# Patient Record
Sex: Female | Born: 1985 | Race: Black or African American | Hispanic: No | Marital: Married | Smoking: Current some day smoker
Health system: Southern US, Community
[De-identification: ages and names within clinical notes are randomized; demographics above are authoritative.]

## PROBLEM LIST (undated history)

## (undated) ENCOUNTER — Inpatient Hospital Stay (HOSPITAL_COMMUNITY): Payer: Medicaid Other

## (undated) DIAGNOSIS — F419 Anxiety disorder, unspecified: Secondary | ICD-10-CM

## (undated) DIAGNOSIS — F32A Depression, unspecified: Secondary | ICD-10-CM

## (undated) DIAGNOSIS — I10 Essential (primary) hypertension: Secondary | ICD-10-CM

## (undated) DIAGNOSIS — T8859XA Other complications of anesthesia, initial encounter: Secondary | ICD-10-CM

## (undated) DIAGNOSIS — R002 Palpitations: Secondary | ICD-10-CM

## (undated) DIAGNOSIS — E119 Type 2 diabetes mellitus without complications: Secondary | ICD-10-CM

## (undated) HISTORY — DX: Depression, unspecified: F32.A

## (undated) HISTORY — DX: Palpitations: R00.2

## (undated) HISTORY — DX: Type 2 diabetes mellitus without complications: E11.9

## (undated) HISTORY — PX: TUBAL LIGATION: SHX77

## (undated) HISTORY — DX: Anxiety disorder, unspecified: F41.9

---

## 2010-10-04 ENCOUNTER — Emergency Department (HOSPITAL_COMMUNITY)
Admission: EM | Admit: 2010-10-04 | Discharge: 2010-10-04 | Disposition: A | Discharge status: Home / Self Care | Payer: PRIVATE HEALTH INSURANCE | Attending: Emergency Medicine | Admitting: Emergency Medicine

## 2010-10-04 DIAGNOSIS — R109 Unspecified abdominal pain: Secondary | ICD-10-CM | POA: Insufficient documentation

## 2010-10-04 DIAGNOSIS — L02219 Cutaneous abscess of trunk, unspecified: Secondary | ICD-10-CM | POA: Insufficient documentation

## 2010-10-04 DIAGNOSIS — L03319 Cellulitis of trunk, unspecified: Secondary | ICD-10-CM | POA: Insufficient documentation

## 2010-11-17 ENCOUNTER — Emergency Department: Payer: Self-pay | Admitting: Emergency Medicine

## 2011-08-06 ENCOUNTER — Emergency Department (HOSPITAL_COMMUNITY)
Admission: EM | Admit: 2011-08-06 | Discharge: 2011-08-07 | Disposition: A | Discharge status: Home Health | Payer: Self-pay | Attending: Emergency Medicine | Admitting: Emergency Medicine

## 2011-08-06 ENCOUNTER — Encounter (HOSPITAL_COMMUNITY): Payer: Self-pay | Admitting: *Deleted

## 2011-08-06 DIAGNOSIS — IMO0001 Reserved for inherently not codable concepts without codable children: Secondary | ICD-10-CM | POA: Insufficient documentation

## 2011-08-06 DIAGNOSIS — L02219 Cutaneous abscess of trunk, unspecified: Secondary | ICD-10-CM | POA: Insufficient documentation

## 2011-08-06 DIAGNOSIS — R609 Edema, unspecified: Secondary | ICD-10-CM | POA: Insufficient documentation

## 2011-08-06 DIAGNOSIS — L0291 Cutaneous abscess, unspecified: Secondary | ICD-10-CM

## 2011-08-06 NOTE — ED Notes (Signed)
The pt has a boil in her groin for approx 2 days.. Redness only initially now more acute pain

## 2011-08-07 MED ORDER — SULFAMETHOXAZOLE-TRIMETHOPRIM 800-160 MG PO TABS: 1.0000 | ORAL_TABLET | Freq: Two times a day (BID) | ORAL | Status: AC

## 2011-08-07 NOTE — ED Notes (Signed)
Patient discharged a few hours ago. Has returned requesting work note. Printed and given to patient.

## 2011-08-07 NOTE — Discharge Instructions (Signed)

## 2011-08-07 NOTE — ED Provider Notes (Signed)
History     CSN: [id number]  Arrival date & time 08/06/11  2231   First MD Initiated Contact with Patient 08/07/11 [phone]      Chief Complaint  Patient presents with  . Recurrent Skin Infections    (Consider location/radiation/quality/duration/timing/severity/associated sxs/prior treatment) The history is provided by the patient.   abscess started about 2 days ago. Patient has history of same requiring I&D. This one started off as a pimple. She believes it is due to shaving her pubic region. It is in the hairline and does not involve the labia. No vaginal discharge. A vaginal bleeding. Abscess has become larger and more painful and swollen. No fevers or chills. No nausea vomiting. Moderate in severity. Pain is sharp and not radiating. Constant and progressive symptoms. No difficulty urinating.  History reviewed. No pertinent past medical history.  History reviewed. No pertinent past surgical history.  History reviewed. No pertinent family history.  History  Substance Use Topics  . Smoking status: Never Smoker   . Smokeless tobacco: Not on file  . Alcohol Use: Yes    OB History    Grav Para Term Preterm Abortions TAB SAB Ect Mult Living                  Review of Systems  Constitutional: Negative for fever and chills.  HENT: Negative for neck pain and neck stiffness.   Eyes: Negative for pain.  Respiratory: Negative for shortness of breath.   Cardiovascular: Negative for chest pain.  Gastrointestinal: Negative for abdominal pain.  Genitourinary: Negative for dysuria and hematuria.  Musculoskeletal: Negative for back pain.  Skin: Negative for rash.  Neurological: Negative for headaches.  All other systems reviewed and are negative.    Allergies  Review of patient's allergies indicates no known allergies.  Home Medications   Current Outpatient Rx  Name Route Sig Dispense Refill  . NAPROXEN SODIUM 220 MG PO TABS Oral Take 440 mg by mouth every 30 (thirty) days.        BP 127/83  Pulse 89  Temp(Src) 97.9 F (36.6 C) (Oral)  Resp 14  SpO2 98%  LMP 07/06/2011  Physical Exam  Constitutional: She is oriented to person, place, and time. She appears well-developed and well-nourished.  HENT:  Head: Normocephalic and atraumatic.  Eyes: Conjunctivae and EOM are normal. Pupils are equal, round, and reactive to light.  Neck: Trachea normal. Neck supple. No thyromegaly present.  Cardiovascular: Normal rate, regular rhythm, S1 normal, S2 normal and normal pulses.     No systolic murmur is present   No diastolic murmur is present  Pulses:      Radial pulses are 2+ on the right side, and 2+ on the left side.  Pulmonary/Chest: Effort normal and breath sounds normal. She has no wheezes. She has no rhonchi. She has no rales. She exhibits no tenderness.  Abdominal: Soft. Normal appearance and bowel sounds are normal. There is no tenderness. There is no CVA tenderness and negative Murphy's sign.  Genitourinary:       Pubic area with palpable tender area of fluctuance consistent with abscess. No active drainage. There is pointing. No labial or vaginal abscess  Musculoskeletal:       BLE:s Calves nontender, no cords or erythema, negative Homans sign  Neurological: She is alert and oriented to person, place, and time. She has normal strength. No cranial nerve deficit or sensory deficit. GCS eye subscore is 4. GCS verbal subscore is 5. GCS motor subscore is 6.  Skin: Skin is warm and dry. No rash noted. She is not diaphoretic.  Psychiatric: Her speech is normal.       Cooperative and appropriate    ED Course  INCISION AND DRAINAGE Date/Time: 08/07/2011 2:52 AM Performed by: Sunnie Nielsen Authorized by: Sunnie Nielsen Consent: Verbal consent obtained. Risks and benefits: risks, benefits and alternatives were discussed Consent given by: patient Patient understanding: patient states understanding of the procedure being performed Patient consent: the patient's  understanding of the procedure matches consent given Procedure consent: procedure consent matches procedure scheduled Patient identity confirmed: verbally with patient Time out: Immediately prior to procedure a "time out" was called to verify the correct patient, procedure, equipment, support staff and site/side marked as required. Type: abscess Location: Midline pelvic region within pubic hairline. Anesthesia: local infiltration Local anesthetic: lidocaine 1% with epinephrine Anesthetic total: 1 ml Risk factor: underlying major vessel, underlying major nerve and coagulopathy Scalpel size: 11 Needle gauge: 22 Incision type: single straight Complexity: complex Drainage: purulent Drainage amount: moderate Wound treatment: wound left open Packing material: 1/4 in iodoform gauze Patient tolerance: Patient tolerated the procedure well with no immediate complications.   (including critical care time)     MDM   Abscess with I&D as above. Plan recheck 48 hours. Antibiotics provided. Reliable historian and agrees to all discharge and followup instructions.        Sunnie Nielsen, MD 08/07/11 [phone]

## 2011-08-10 LAB — WOUND CULTURE

## 2011-08-11 NOTE — ED Notes (Signed)
+   Urine; treated per protocol; sensitive to same

## 2012-02-20 ENCOUNTER — Emergency Department (HOSPITAL_COMMUNITY)
Admission: EM | Admit: 2012-02-20 | Discharge: 2012-02-21 | Disposition: A | Discharge status: Home / Self Care | Payer: 59 | Attending: Emergency Medicine | Admitting: Emergency Medicine

## 2012-02-20 ENCOUNTER — Encounter (HOSPITAL_COMMUNITY): Payer: Self-pay | Admitting: Emergency Medicine

## 2012-02-20 DIAGNOSIS — L02219 Cutaneous abscess of trunk, unspecified: Secondary | ICD-10-CM | POA: Insufficient documentation

## 2012-02-20 DIAGNOSIS — L03319 Cellulitis of trunk, unspecified: Secondary | ICD-10-CM | POA: Insufficient documentation

## 2012-02-20 DIAGNOSIS — L0291 Cutaneous abscess, unspecified: Secondary | ICD-10-CM

## 2012-02-20 DIAGNOSIS — F17200 Nicotine dependence, unspecified, uncomplicated: Secondary | ICD-10-CM | POA: Insufficient documentation

## 2012-02-20 NOTE — ED Notes (Signed)
Pt has large (ping pong ball) sized abscess left groin area. This is recurrent from I&D several months ago (february) at [facility]. Noted enlarged area 2 days ago, and now has become more painful. Did take Advil PT which has helped with pain.

## 2012-02-21 MED ORDER — LIDOCAINE-EPINEPHRINE 2 %-1:100000 IJ SOLN
20.0000 mL | Freq: Once | INTRAMUSCULAR | Status: AC
  Administered 2012-02-21: 20 mL

## 2012-02-21 MED ORDER — HYDROCODONE-ACETAMINOPHEN 5-325 MG PO TABS: ORAL_TABLET | ORAL | Status: AC

## 2012-02-21 NOTE — ED Provider Notes (Signed)
History     CSN: [id number]  Arrival date & time 02/20/12  2318   First MD Initiated Contact with Patient 02/21/12 0023      Chief Complaint  Patient presents with  . Abscess    (Consider location/radiation/quality/duration/timing/severity/associated sxs/prior treatment) HPI Comments: Patient presents with complaint of left groin abscess. Patient noted the area began to swell more yesterday. She has had a abscess in this area before and has been drinking in the past. Patient has been taking Motrin which has improved her pain. She denies any drainage, fever, vomiting. No other treatments prior to arrival. Nothing makes symptoms worse.  Patient is a 26 y.o. female presenting with abscess. The history is provided by the patient.  Abscess  This is a recurrent problem. The current episode started yesterday. The onset was gradual. The problem has been gradually worsening. The abscess is present on the groin. The abscess is characterized by swelling. Pertinent negatives include no fever and no vomiting.    History reviewed. No pertinent past medical history.  History reviewed. No pertinent past surgical history.  No family history on file.  History  Substance Use Topics  . Smoking status: Current Some Day Smoker    Types: Cigars  . Smokeless tobacco: Never Used  . Alcohol Use: Yes     vodka couple times month    OB History    Grav Para Term Preterm Abortions TAB SAB Ect Mult Living                  Review of Systems  Constitutional: Negative for fever.  Gastrointestinal: Negative for nausea and vomiting.  Skin: Negative for color change.       Positive for abscess.  Hematological: Negative for adenopathy.    Allergies  Review of patient's allergies indicates no known allergies.  Home Medications   Current Outpatient Rx  Name Route Sig Dispense Refill  . NAPROXEN SODIUM 220 MG PO TABS Oral Take 440 mg by mouth every 30 (thirty) days.      BP 117/82  Pulse 88   Temp 98.4 F (36.9 C) (Oral)  SpO2 100%  LMP 02/02/2012  Physical Exam  Nursing note and vitals reviewed. Constitutional: She appears well-developed and well-nourished.  HENT:  Head: Normocephalic and atraumatic.  Eyes: Conjunctivae normal are normal.  Neck: Normal range of motion. Neck supple.  Pulmonary/Chest: No respiratory distress.  Neurological: She is alert.  Skin: Skin is warm and dry.       2 cm x 3 cm area of induration with overlying erythema of left groin consistent with abscess. There is no surrounding erythema to suggest cellulitis infection. There is no drainage from the area.  Psychiatric: She has a normal mood and affect.    ED Course  Procedures (including critical care time)  Labs Reviewed - No data to display No results found.   1. Abscess     12:25 AM Patient seen and examined. Work-up initiated.   Vital signs reviewed and are as follows: Filed Vitals:   02/20/12 2350  BP: 117/82  Pulse: 88  Temp: 98.4 F (36.9 C)   INCISION AND DRAINAGE Performed by: Carolee Rota Consent: Verbal consent obtained. Risks and benefits: risks, benefits and alternatives were discussed Type: abscess  Body area: left groin  Anesthesia: local infiltration  Local anesthetic: lidocaine 2% with epinephrine  Anesthetic total: 2 ml  Complexity: complex Blunt dissection to break up loculations  Drainage: purulent  Drainage amount: moderate  Packing material: 1/4  in iodoform gauze  Patient tolerance: Patient tolerated the procedure well with no immediate complications.  The patient was urged to return to the Emergency Department urgently with worsening pain, swelling, expanding erythema especially if it streaks away from the affected area, fever, or if they have any other concerns.   The patient was instructed to remove packing at home in 48 hours and return to the Emergency Department or go to their PCP at that time for a recheck if their symptoms are not  entirely resolved.  The patient verbalized understanding and stated agreement with this plan.   Patient counseled on use of narcotic pain medications. Counseled not to combine these medications with others containing tylenol. Urged not to drink alcohol, drive, or perform any other activities that requires focus while taking these medications. The patient verbalizes understanding and agrees with the plan.   MDM  Patient with skin abscess amenable to incision and drainage.  Abscess was large enough to warrant packing with removal in 2 days. No signs of cellulitis is surrounding skin.  Will d/c to home.  No antibiotic therapy is indicated.         [place], [place] 02/21/12 [phone]

## 2012-02-21 NOTE — ED Notes (Signed)
Discharge instructions reviewed w/ pt., verbalizes understanding. One prescription provided at discharge. 

## 2012-02-21 NOTE — ED Provider Notes (Signed)
Medical screening examination/treatment/procedure(s) were performed by non-physician practitioner and as supervising physician I was immediately available for consultation/collaboration.   Hanley Seamen, MD 02/21/12 [phone]

## 2012-04-25 ENCOUNTER — Emergency Department (HOSPITAL_COMMUNITY)
Admission: EM | Admit: 2012-04-25 | Discharge: 2012-04-25 | Disposition: A | Discharge status: Home / Self Care | Payer: Self-pay | Attending: Emergency Medicine | Admitting: Emergency Medicine

## 2012-04-25 ENCOUNTER — Encounter (HOSPITAL_COMMUNITY): Payer: Self-pay | Admitting: Emergency Medicine

## 2012-04-25 DIAGNOSIS — R112 Nausea with vomiting, unspecified: Secondary | ICD-10-CM | POA: Insufficient documentation

## 2012-04-25 DIAGNOSIS — Z79899 Other long term (current) drug therapy: Secondary | ICD-10-CM | POA: Insufficient documentation

## 2012-04-25 DIAGNOSIS — F17200 Nicotine dependence, unspecified, uncomplicated: Secondary | ICD-10-CM | POA: Insufficient documentation

## 2012-04-25 LAB — COMPREHENSIVE METABOLIC PANEL
Albumin: 3.5 g/dL (ref 3.5–5.2)
BUN: 9 mg/dL (ref 6–23)
Creatinine, Ser: 0.6 mg/dL (ref 0.50–1.10)
Total Protein: 7.2 g/dL (ref 6.0–8.3)

## 2012-04-25 LAB — CBC WITH DIFFERENTIAL/PLATELET
Basophils Relative: 0 % (ref 0–1)
Eosinophils Absolute: 0 10*3/uL (ref 0.0–0.7)
HCT: 37.5 % (ref 36.0–46.0)
Hemoglobin: 13.1 g/dL (ref 12.0–15.0)
MCH: 28.2 pg (ref 26.0–34.0)
MCHC: 34.9 g/dL (ref 30.0–36.0)
Monocytes Absolute: 0.5 10*3/uL (ref 0.1–1.0)
Monocytes Relative: 9 % (ref 3–12)
Neutrophils Relative %: 81 % — ABNORMAL HIGH (ref 43–77)
RDW: 12.8 % (ref 11.5–15.5)

## 2012-04-25 LAB — URINALYSIS, ROUTINE W REFLEX MICROSCOPIC
Glucose, UA: NEGATIVE mg/dL
Leukocytes, UA: NEGATIVE
Protein, ur: NEGATIVE mg/dL
Urobilinogen, UA: 1 mg/dL (ref 0.0–1.0)

## 2012-04-25 MED ORDER — ONDANSETRON HCL 4 MG/2ML IJ SOLN
4.0000 mg | Freq: Once | INTRAMUSCULAR | Status: AC
  Administered 2012-04-25: 4 mg via INTRAVENOUS
  Filled 2012-04-25: qty 2

## 2012-04-25 MED ORDER — ONDANSETRON HCL 4 MG PO TABS: 4.0000 mg | ORAL_TABLET | Freq: Three times a day (TID) | ORAL | Status: DC | PRN

## 2012-04-25 MED ORDER — SODIUM CHLORIDE 0.9 % IV BOLUS (SEPSIS)
1000.0000 mL | Freq: Once | INTRAVENOUS | Status: AC
  Administered 2012-04-25: 1000 mL via INTRAVENOUS

## 2012-04-25 NOTE — ED Notes (Signed)
PA at bedside.

## 2012-04-25 NOTE — Progress Notes (Signed)
Late entry for 1300 04/25/12 no pcp listed for pt CM discussed with pt how to obtain an in network provider through [facility] for follow up care

## 2012-04-25 NOTE — ED Notes (Signed)
Pt escorted to discharge window. Pt verbalized understanding discharge instructions. In no acute distress.  

## 2012-04-25 NOTE — ED Notes (Signed)
Pt c/o abdominal pain that started yesterday and nausea. Did vomit this morning and became drenched in sweat however not actively vomiting at the moment. Pt states that she does become lightheaded at times.

## 2012-04-25 NOTE — ED Notes (Signed)
Per PA order pt has drank fluids and not vomitted

## 2012-04-25 NOTE — ED Provider Notes (Signed)
Medical screening examination/treatment/procedure(s) were performed by non-physician practitioner and as supervising physician I was immediately available for consultation/collaboration.  Flint Melter, MD 04/25/12 2126

## 2012-04-25 NOTE — ED Provider Notes (Signed)
History     CSN: [id number]  Arrival date & time 04/25/12  1052   First MD Initiated Contact with Patient 04/25/12 1156      Chief Complaint  Patient presents with  . Abdominal Pain  . Nausea    (Consider location/radiation/quality/duration/timing/severity/associated sxs/prior treatment) HPI  Joyce Green is a 26 y.o. female complaining of nausea and abdominal pain that both started and resolved yesterday. At its worse pain was 6/10, non-localizable. Patient had single episode of nonbloody nonbilious non-coffee-ground emesis this morning and reports a subjective fever. She also states she has a lightheaded sensation yesterday but is now resolved. Last menstrual period ended to days ago.  History reviewed. No pertinent past medical history.  History reviewed. No pertinent past surgical history.  History reviewed. No pertinent family history.  History  Substance Use Topics  . Smoking status: Light Tobacco Smoker    Types: Cigars  . Smokeless tobacco: Never Used  . Alcohol Use: Yes     Comment: vodka couple times month    OB History    Grav Para Term Preterm Abortions TAB SAB Ect Mult Living                  Review of Systems  Constitutional: Negative for fever.  Respiratory: Negative for shortness of breath.   Cardiovascular: Negative for chest pain.  Gastrointestinal: Positive for nausea. Negative for vomiting, abdominal pain and diarrhea.  All other systems reviewed and are negative.    Allergies  Review of patient's allergies indicates no known allergies.  Home Medications   Current Outpatient Rx  Name  Route  Sig  Dispense  Refill  . BIOTIN PO   Oral   Take 1 tablet by mouth daily.         Marland Kitchen VICKS NYQUIL COLD & FLU NIGHT PO   Oral   Take 1 capsule by mouth every evening.         Marland Kitchen NAPROXEN SODIUM 220 MG PO TABS   Oral   Take 440 mg by mouth every 30 (thirty) days.         . DAYQUIL PO   Oral   Take 1 capsule by mouth every  morning.           BP 135/92  Pulse 90  Temp 97.9 F (36.6 C) (Oral)  Resp 18  Ht 5\' 10"  (1.778 m)  Wt 205 lb (92.987 kg)  BMI 29.41 kg/m2  SpO2 100%  LMP 04/21/2012  Physical Exam  Nursing note and vitals reviewed. Constitutional: She is oriented to person, place, and time. She appears well-developed and well-nourished. No distress.  HENT:  Head: Normocephalic.  Mouth/Throat: Oropharynx is clear and moist.  Eyes: Conjunctivae normal and EOM are normal. Pupils are equal, round, and reactive to light.  Neck: Normal range of motion.  Cardiovascular: Normal rate.   Pulmonary/Chest: Effort normal and breath sounds normal. No stridor. No respiratory distress. She has no wheezes. She has no rales. She exhibits no tenderness.  Abdominal: Soft. Bowel sounds are normal. She exhibits no distension and no mass. There is no rebound and no guarding.  Musculoskeletal: Normal range of motion.  Neurological: She is alert and oriented to person, place, and time.  Skin: Skin is warm.  Psychiatric: She has a normal mood and affect.    ED Course  Procedures (including critical care time)  Labs Reviewed  URINALYSIS, ROUTINE W REFLEX MICROSCOPIC - Abnormal; Notable for the following:    APPearance CLOUDY (*)  Specific Gravity, Urine 1.033 (*)     Bilirubin Urine SMALL (*)     Ketones, ur TRACE (*)     All other components within normal limits  CBC WITH DIFFERENTIAL - Abnormal; Notable for the following:    Neutrophils Relative 81 (*)     Lymphocytes Relative 10 (*)     Lymphs Abs 0.6 (*)     All other components within normal limits  COMPREHENSIVE METABOLIC PANEL - Abnormal; Notable for the following:    Glucose, Bld 219 (*)     Calcium 8.3 (*)     All other components within normal limits  PREGNANCY, URINE   No results found.   1. Nausea & vomiting       MDM  Benign abdominal Exam  Urinalysis and blood work are unremarkable except for a mildly elevated blood glucose of  200.  Patient is tolerating by mouth at this time. Serial abdominal exams remained benign and patient remains pain-free. Vital signs are stable results discussed the patient's she is amenable to discharge with return precautions given and I advised her to follow in one to 2 weeks for a recheck of her blood sugar. Patient verbalized understanding  New Prescriptions   ONDANSETRON (ZOFRAN) 4 MG TABLET    Take 1 tablet (4 mg total) by mouth every 8 (eight) hours as needed for nausea.          Wynetta Emery, PA-C 04/25/12 1332

## 2012-04-25 NOTE — ED Notes (Signed)
Pt alert and oriented x4. Respirations even and unlabored, bilateral symmetrical rise and fall of chest. Skin warm and dry. In no acute distress. Denies needs.   

## 2012-08-13 LAB — HM DIABETES EYE EXAM: HM Diabetic Eye Exam: NORMAL

## 2012-11-04 ENCOUNTER — Emergency Department (HOSPITAL_COMMUNITY): Payer: 59

## 2012-11-04 ENCOUNTER — Emergency Department (HOSPITAL_COMMUNITY)
Admission: EM | Admit: 2012-11-04 | Discharge: 2012-11-05 | Disposition: A | Discharge status: Home / Self Care | Payer: 59 | Attending: Emergency Medicine | Admitting: Emergency Medicine

## 2012-11-04 ENCOUNTER — Encounter (HOSPITAL_COMMUNITY): Payer: Self-pay | Admitting: *Deleted

## 2012-11-04 DIAGNOSIS — F17200 Nicotine dependence, unspecified, uncomplicated: Secondary | ICD-10-CM | POA: Insufficient documentation

## 2012-11-04 DIAGNOSIS — R0789 Other chest pain: Secondary | ICD-10-CM | POA: Insufficient documentation

## 2012-11-04 DIAGNOSIS — Z79899 Other long term (current) drug therapy: Secondary | ICD-10-CM | POA: Insufficient documentation

## 2012-11-04 DIAGNOSIS — R002 Palpitations: Secondary | ICD-10-CM | POA: Insufficient documentation

## 2012-11-04 DIAGNOSIS — Z3202 Encounter for pregnancy test, result negative: Secondary | ICD-10-CM | POA: Insufficient documentation

## 2012-11-04 LAB — URINALYSIS, ROUTINE W REFLEX MICROSCOPIC
Ketones, ur: NEGATIVE mg/dL
Leukocytes, UA: NEGATIVE
Nitrite: NEGATIVE
Specific Gravity, Urine: 1.021 (ref 1.005–1.030)
pH: 6 (ref 5.0–8.0)

## 2012-11-04 LAB — POCT I-STAT TROPONIN I: Troponin i, poc: 0 ng/mL (ref 0.00–0.08)

## 2012-11-04 LAB — POCT I-STAT, CHEM 8
Chloride: 107 mEq/L (ref 96–112)
HCT: 41 % (ref 36.0–46.0)
Potassium: 3.8 mEq/L (ref 3.5–5.1)
Sodium: 140 mEq/L (ref 135–145)

## 2012-11-04 NOTE — ED Provider Notes (Signed)
History     CSN: [id number]  Arrival date & time 11/04/12  2021   First MD Initiated Contact with Patient 11/04/12 2136      Chief Complaint  Patient presents with  . Chest Pain    (Consider location/radiation/quality/duration/timing/severity/associated sxs/prior treatment) HPI Pt with recurrent central chest pain, palpitations and anxiety for several years. Has never seen cardiologist. States she started having the palpitations 4 days ago associated with chest pain. No cough, fever, chills, SOB, lower ext swelling or pain, recent travel/surgery, family hx of PE/DVT or CAD. No nausea, vomiting, diarrhea. No recent weight loss. Pt states she has not been under increased stress lately and has no history of panic disorder or depression.  Past Medical History  Diagnosis Date  . Palpitations   . Nausea   . AP (abdominal pain)     History reviewed. No pertinent past surgical history.  No family history on file.  History  Substance Use Topics  . Smoking status: Light Tobacco Smoker    Types: Cigars  . Smokeless tobacco: Never Used  . Alcohol Use: Yes     Comment: vodka couple times month    OB History   Grav Para Term Preterm Abortions TAB SAB Ect Mult Living                  Review of Systems  Constitutional: Negative for fever, chills, diaphoresis, activity change, appetite change, fatigue and unexpected weight change.  HENT: Negative for neck pain and neck stiffness.   Respiratory: Negative for cough, shortness of breath and wheezing.   Cardiovascular: Positive for chest pain and palpitations. Negative for leg swelling.  Gastrointestinal: Negative for nausea, vomiting, abdominal pain and diarrhea.  Genitourinary: Negative for dysuria.  Musculoskeletal: Negative for myalgias and back pain.  Skin: Negative for rash and wound.  Neurological: Negative for dizziness, syncope, weakness, numbness and headaches.  All other systems reviewed and are negative.    Allergies   Review of patient's allergies indicates no known allergies.  Home Medications   Current Outpatient Rx  Name  Route  Sig  Dispense  Refill  . aspirin 325 MG tablet   Oral   Take 325 mg by mouth every 4 (four) hours as needed for pain.         Marland Kitchen BIOTIN PO   Oral   Take 1 tablet by mouth daily.           BP 149/105  Pulse 74  Temp(Src) 98.3 F (36.8 C)  Resp 20  SpO2 100%  LMP 10/05/2012  Physical Exam  Nursing note and vitals reviewed. Constitutional: She is oriented to person, place, and time. She appears well-developed and well-nourished. No distress.  HENT:  Head: Normocephalic and atraumatic.  Mouth/Throat: Oropharynx is clear and moist.  Eyes: EOM are normal. Pupils are equal, round, and reactive to light.  Mild protosis   Neck: Normal range of motion. Neck supple. No thyromegaly present.  Cardiovascular: Normal rate and regular rhythm.  Exam reveals no gallop and no friction rub.   No murmur heard. Pulmonary/Chest: Effort normal and breath sounds normal. No respiratory distress. She has no wheezes. She has no rales.  Mildly prolonged expiratory phase  Abdominal: Soft. Bowel sounds are normal. She exhibits no distension and no mass. There is no tenderness. There is no rebound and no guarding.  Musculoskeletal: Normal range of motion. She exhibits no edema and no tenderness.  No calf swelling or pain  Neurological: She is alert and  oriented to person, place, and time.  5/5 motor in all ext, sensation intact  Skin: Skin is warm and dry. No rash noted. No erythema.  Psychiatric: She has a normal mood and affect. Her behavior is normal.  Does not appear anxious. Good eye contact    ED Course  Procedures (including critical care time)  Labs Reviewed  URINALYSIS, ROUTINE W REFLEX MICROSCOPIC - Abnormal; Notable for the following:    APPearance CLOUDY (*)    All other components within normal limits  POCT I-STAT, CHEM 8 - Abnormal; Notable for the following:     Glucose, Bld 174 (*)    All other components within normal limits  PREGNANCY, URINE  TSH  POCT I-STAT TROPONIN I   Dg Chest 2 View  11/04/2012   *RADIOLOGY REPORT*  Clinical Data: Heart palpitations  CHEST - 2 VIEW  Comparison: None.  Findings: Normal mediastinum and cardiac silhouette.  Normal pulmonary  vasculature.  No evidence of effusion, infiltrate, or pneumothorax.  No acute bony abnormality.  IMPRESSION: No acute cardiopulmonary process.   Original Report Authenticated By: Genevive Bi, M.D.     1. Palpitations   2. Atypical chest pain      Date: 11/04/2012  Rate: 88  Rhythm: normal sinus rhythm  QRS Axis: normal  Intervals: normal  ST/T Wave abnormalities: nonspecific T wave changes  Conduction Disutrbances:none  Narrative Interpretation:   Old EKG Reviewed: none available    MDM   Pt resting well in ED and is asymptomatic. i have advised cardiology f/u and given strict return precautions. CAD is very unlikely though episodic arrythmia is possible.        Loren Racer, MD 11/06/12 [phone]

## 2012-11-04 NOTE — ED Notes (Signed)
Patient is alert and oriented x3.  She is complaining of sharp intermittent chest pain that started 4 days ago and progressively worsened.  Currently she rates her pain 1 of 10.  She states that she has had this issue before.

## 2012-11-04 NOTE — ED Notes (Signed)
Denies any shortness of breath

## 2012-11-04 NOTE — ED Notes (Signed)
Pt states having spasms that feel like chest pain and feels like heart racing; treated for same two years ago at [facility]; recently x 3 days has had reoccuring symptoms; pain can be sharp at times other times just heart racing

## 2012-11-05 LAB — TSH: TSH: 1.98 u[IU]/mL (ref 0.350–4.500)

## 2012-11-05 NOTE — ED Notes (Signed)
Patient is alert and oriented x3.  She was given DC instructions and follow up visit instructions.  Patient gave verbal understanding. She was DC ambulatory under her own power to home.  V/S stable.  He was not showing any signs of distress on DC 

## 2012-11-06 ENCOUNTER — Encounter: Payer: Self-pay | Admitting: Cardiology

## 2012-11-06 ENCOUNTER — Encounter: Payer: Self-pay | Admitting: *Deleted

## 2012-11-06 DIAGNOSIS — R110 Nausea: Secondary | ICD-10-CM | POA: Insufficient documentation

## 2012-11-06 DIAGNOSIS — R109 Unspecified abdominal pain: Secondary | ICD-10-CM | POA: Insufficient documentation

## 2012-11-06 DIAGNOSIS — R002 Palpitations: Secondary | ICD-10-CM | POA: Insufficient documentation

## 2012-11-07 ENCOUNTER — Encounter: Payer: Self-pay | Admitting: Cardiology

## 2012-11-07 ENCOUNTER — Ambulatory Visit (INDEPENDENT_AMBULATORY_CARE_PROVIDER_SITE_OTHER): Payer: 59

## 2012-11-07 ENCOUNTER — Ambulatory Visit (INDEPENDENT_AMBULATORY_CARE_PROVIDER_SITE_OTHER): Payer: 59 | Admitting: Cardiology

## 2012-11-07 VITALS — BP 130/84 | HR 88 | Ht 70.0 in | Wt 217.0 lb

## 2012-11-07 DIAGNOSIS — R002 Palpitations: Secondary | ICD-10-CM

## 2012-11-07 DIAGNOSIS — O24319 Unspecified pre-existing diabetes mellitus in pregnancy, unspecified trimester: Secondary | ICD-10-CM | POA: Insufficient documentation

## 2012-11-07 DIAGNOSIS — R079 Chest pain, unspecified: Secondary | ICD-10-CM

## 2012-11-07 DIAGNOSIS — R7309 Other abnormal glucose: Secondary | ICD-10-CM

## 2012-11-07 DIAGNOSIS — R0989 Other specified symptoms and signs involving the circulatory and respiratory systems: Secondary | ICD-10-CM

## 2012-11-07 NOTE — Assessment & Plan Note (Signed)
Electrocardiogram normal. Check echocardiogram. Symptoms not consistent with ischemia.

## 2012-11-07 NOTE — Progress Notes (Signed)
Placed a 30 day event monitor on patient 

## 2012-11-07 NOTE — Patient Instructions (Addendum)
Your physician recommends that you schedule a follow-up appointment in: 8-10 WEEKS   Your physician has requested that you have an echocardiogram. Echocardiography is a painless test that uses sound waves to create images of your heart. It provides your doctor with information about the size and shape of your heart and how well your heart's chambers and valves are working. This procedure takes approximately one hour. There are no restrictions for this procedure.   Your physician has recommended that you wear an event monitor. Event monitors are medical devices that record the heart's electrical activity. Doctors most often [place] these monitors to diagnose arrhythmias. Arrhythmias are problems with the speed or rhythm of the heartbeat. The monitor is a small, portable device. You can wear one while you do your normal daily activities. This is usually used to diagnose what is causing palpitations/syncope (passing out).    REFERRAL TO PRIMARY CARE

## 2012-11-07 NOTE — Assessment & Plan Note (Signed)
Plan Card[employer].

## 2012-11-07 NOTE — Progress Notes (Signed)
  HPI: 27 year old female for evaluation of chest pain. Seen in the emergency room in May of 2014 for chest pain. Electrocardiogram showed no ST changes. Chest x-ray negative. Pregnancy test negative. Potassium 3.8. Glucose 174. Troponin negative. TSH normal. Patient has had intermittent palpitations for approximately one year. They are sudden in onset and described as her heart pounding. They last 15 minutes and resolve spontaneously. She has chest pain with this. No dyspnea or syncope. She has also had chest pain. It is in the left upper chest. It is both sharp and dull. It does not radiate. The pain is not exertional. It lasts approximately 15 minutes and resolves. She does notice these episodes more with stress.  Current Outpatient Prescriptions  Medication Sig Dispense Refill  . BIOTIN PO Take 1 tablet by mouth daily.       No current facility-administered medications for this visit.    No Known Allergies  Past Medical History  Diagnosis Date  . Palpitations     History reviewed. No pertinent past surgical history.  History   Social History  . Marital Status: Single    Spouse Name: N/A    Number of Children: N/A  . Years of Education: N/A   Occupational History  . Not on file.   Social History Main Topics  . Smoking status: Current Every Day Smoker    Types: Cigars  . Smokeless tobacco: Never Used  . Alcohol Use: Yes     Comment: vodka couple times month  . Drug Use: No  . Sexually Active: Yes    Birth Control/ Protection: None   Other Topics Concern  . Not on file   Social History Narrative  . No narrative on file    Family History  Problem Relation Age of Onset  . Diabetes Mellitus I Mother     ROS: no fevers or chills, productive cough, hemoptysis, dysphasia, odynophagia, melena, hematochezia, dysuria, hematuria, rash, seizure activity, orthopnea, PND, pedal edema, claudication. Remaining systems are negative.  Physical Exam:   Blood pressure 130/84,  pulse 88, height 5\' 10"  (1.778 m), weight 217 lb (98.431 kg), last menstrual period 10/05/2012.  General:  Well developed/well nourished in NAD Skin warm/dry Patient not depressed No peripheral clubbing Back-normal HEENT-normal/normal eyelids Neck supple/normal carotid upstroke bilaterally; no bruits; no JVD; no thyromegaly chest - CTA/ normal expansion CV - RRR/normal S1 and S2; no murmurs, rubs or gallops;  PMI nondisplaced Abdomen -NT/ND, no HSM, no mass, + bowel sounds, no bruit 2+ femoral pulses, no bruits Ext-no edema, chords, 2+ DP Neuro-grossly nonfocal  ECG 11/04/2012-sinus rhythm with no ST changes.

## 2012-11-07 NOTE — Assessment & Plan Note (Signed)
Patient may have diabetes mellitus. We will arrange for primary care evaluation.

## 2012-11-18 ENCOUNTER — Ambulatory Visit (HOSPITAL_COMMUNITY): Payer: 59 | Attending: Cardiology

## 2012-11-18 DIAGNOSIS — R072 Precordial pain: Secondary | ICD-10-CM

## 2012-11-18 DIAGNOSIS — R079 Chest pain, unspecified: Secondary | ICD-10-CM | POA: Insufficient documentation

## 2012-11-18 DIAGNOSIS — R002 Palpitations: Secondary | ICD-10-CM | POA: Insufficient documentation

## 2012-11-18 DIAGNOSIS — F17200 Nicotine dependence, unspecified, uncomplicated: Secondary | ICD-10-CM | POA: Insufficient documentation

## 2012-11-18 NOTE — Progress Notes (Signed)
Echocardiogram performed.  

## 2012-12-09 ENCOUNTER — Telehealth: Payer: Self-pay | Admitting: *Deleted

## 2012-12-09 NOTE — Telephone Encounter (Signed)
Patient contacted regarding 30 day event monitor.  Patient unable to establish baseline connection with [employer] monitor due to poor cell signal. [employer] sent patient a second monitor to try but she was still unable to establish a connection.  Patient offered to try a different monitoring company.  She refused at this time stating she was no longer experiencing palpitations.  Patient states this was discussed at her last appointment.

## 2013-01-07 ENCOUNTER — Ambulatory Visit (INDEPENDENT_AMBULATORY_CARE_PROVIDER_SITE_OTHER): Payer: 59 | Admitting: Internal Medicine

## 2013-01-07 ENCOUNTER — Other Ambulatory Visit (INDEPENDENT_AMBULATORY_CARE_PROVIDER_SITE_OTHER): Payer: 59

## 2013-01-07 ENCOUNTER — Encounter: Payer: Self-pay | Admitting: Internal Medicine

## 2013-01-07 VITALS — BP 132/78 | HR 96 | Temp 98.3°F | Resp 16 | Ht 71.0 in | Wt 227.2 lb

## 2013-01-07 DIAGNOSIS — IMO0001 Reserved for inherently not codable concepts without codable children: Secondary | ICD-10-CM

## 2013-01-07 DIAGNOSIS — F17200 Nicotine dependence, unspecified, uncomplicated: Secondary | ICD-10-CM

## 2013-01-07 DIAGNOSIS — R7309 Other abnormal glucose: Secondary | ICD-10-CM

## 2013-01-07 DIAGNOSIS — E669 Obesity, unspecified: Secondary | ICD-10-CM

## 2013-01-07 LAB — MICROALBUMIN / CREATININE URINE RATIO: Creatinine,U: 142.4 mg/dL

## 2013-01-07 LAB — CBC WITH DIFFERENTIAL/PLATELET
Basophils Relative: 0.2 % (ref 0.0–3.0)
Hemoglobin: 13.1 g/dL (ref 12.0–15.0)
Lymphocytes Relative: 16.2 % (ref 12.0–46.0)
MCHC: 32.5 g/dL (ref 30.0–36.0)
Monocytes Relative: 7.6 % (ref 3.0–12.0)
Neutro Abs: 5.1 10*3/uL (ref 1.4–7.7)
RBC: 4.78 Mil/uL (ref 3.87–5.11)

## 2013-01-07 LAB — LIPID PANEL
Cholesterol: 133 mg/dL (ref 0–200)
LDL Cholesterol: 83 mg/dL (ref 0–99)
Total CHOL/HDL Ratio: 3
VLDL: 8.6 mg/dL (ref 0.0–40.0)

## 2013-01-07 LAB — COMPREHENSIVE METABOLIC PANEL
AST: 120 U/L — ABNORMAL HIGH (ref 0–37)
BUN: 8 mg/dL (ref 6–23)
Calcium: 9.2 mg/dL (ref 8.4–10.5)
Chloride: 103 mEq/L (ref 96–112)
Creatinine, Ser: 0.7 mg/dL (ref 0.4–1.2)
GFR: 138.6 mL/min (ref >60.00)

## 2013-01-07 LAB — HM DIABETES FOOT EXAM

## 2013-01-07 LAB — URINALYSIS, ROUTINE W REFLEX MICROSCOPIC
Hgb urine dipstick: NEGATIVE
Leukocytes, UA: NEGATIVE
Nitrite: NEGATIVE
RBC / HPF: NONE SEEN (ref 0–?)
Specific Gravity, Urine: 1.02 (ref 1.000–1.030)
Urine Glucose: >=1000
Urobilinogen, UA: 1 (ref 0.0–1.0)

## 2013-01-07 NOTE — Patient Instructions (Signed)
Type 2 Diabetes Mellitus, Adult Type 2 diabetes mellitus, often simply referred to as type 2 diabetes, is a long-lasting (chronic) disease. In type 2 diabetes, the pancreas does not make enough insulin (a hormone), the cells are less responsive to the insulin that is made (insulin resistance), or both. Normally, insulin moves sugars from food into the tissue cells. The tissue cells use the sugars for energy. The lack of insulin or the lack of normal response to insulin causes excess sugars to build up in the blood instead of going into the tissue cells. As a result, high blood sugar (hyperglycemia) develops. The effect of high sugar (glucose) levels can cause many complications. Type 2 diabetes was also previously called adult-onset diabetes but it can occur at any age.  RISK FACTORS  A person is predisposed to developing type 2 diabetes if someone in the family has the disease and also has one or more of the following primary risk factors:  Overweight.  An inactive lifestyle.  A history of consistently eating high-calorie foods. Maintaining a normal weight and regular physical activity can reduce the chance of developing type 2 diabetes. SYMPTOMS  A person with type 2 diabetes may not show symptoms initially. The symptoms of type 2 diabetes appear slowly. The symptoms include:  Increased thirst (polydipsia).  Increased urination (polyuria).  Increased urination during the night (nocturia).  Weight loss. This weight loss may be rapid.  Frequent, recurring infections.  Tiredness (fatigue).  Weakness.  Vision changes, such as blurred vision.  Fruity smell to your breath.  Abdominal pain.  Nausea or vomiting.  Cuts or bruises which are slow to heal.  Tingling or numbness in the hands or feet. DIAGNOSIS Type 2 diabetes is frequently not diagnosed until complications of diabetes are present. Type 2 diabetes is diagnosed when symptoms or complications are present and when blood  glucose levels are increased. Your blood glucose level may be checked by one or more of the following blood tests:  A fasting blood glucose test. You will not be allowed to eat for at least 8 hours before a blood sample is taken.  A random blood glucose test. Your blood glucose is checked at any time of the day regardless of when you ate.  A hemoglobin A1c blood glucose test. A hemoglobin A1c test provides information about blood glucose control over the previous 3 months.  An oral glucose tolerance test (OGTT). Your blood glucose is measured after you have not eaten (fasted) for 2 hours and then after you drink a glucose-containing beverage. TREATMENT   You may need to take insulin or diabetes medicine daily to keep blood glucose levels in the desired range.  You will need to match insulin dosing with exercise and healthy food choices. The treatment goal is to maintain the before meal blood sugar (preprandial glucose) level at 70 130 mg/dL. HOME CARE INSTRUCTIONS   Have your hemoglobin A1c level checked twice a year.  Perform daily blood glucose monitoring as directed by your caregiver.  Monitor urine ketones when you are ill and as directed by your caregiver.  Take your diabetes medicine or insulin as directed by your caregiver to maintain your blood glucose levels in the desired range.  Never run out of diabetes medicine or insulin. It is needed every day.  Adjust insulin based on your intake of carbohydrates. Carbohydrates can raise blood glucose levels but need to be included in your diet. Carbohydrates provide vitamins, minerals, and fiber which are an essential part of   a healthy diet. Carbohydrates are found in fruits, vegetables, whole grains, dairy products, legumes, and foods containing added sugars.    Eat healthy foods. Alternate 3 meals with 3 snacks.  Lose weight if overweight.  Carry a medical alert card or wear your medical alert jewelry.  Carry a 15 gram  carbohydrate snack with you at all times to treat low blood glucose (hypoglycemia). Some examples of 15 gram carbohydrate snacks include:  Glucose tablets, 3 or 4   Glucose gel, 15 gram tube  Raisins, 2 tablespoons (24 grams)  Jelly beans, 6  Animal crackers, 8  Regular pop, 4 ounces (120 mL)  Gummy treats, 9  Recognize hypoglycemia. Hypoglycemia occurs with blood glucose levels of 70 mg/dL and below. The risk for hypoglycemia increases when fasting or skipping meals, during or after intense exercise, and during sleep. Hypoglycemia symptoms can include:  Tremors or shakes.  Decreased ability to concentrate.  Sweating.  Increased heart rate.  Headache.  Dry mouth.  Hunger.  Irritability.  Anxiety.  Restless sleep.  Altered speech or coordination.  Confusion.  Treat hypoglycemia promptly. If you are alert and able to safely swallow, follow the 15:15 rule:  Take 15 20 grams of rapid-acting glucose or carbohydrate. Rapid-acting options include glucose gel, glucose tablets, or 4 ounces (120 mL) of fruit juice, regular soda, or low fat milk.  Check your blood glucose level 15 minutes after taking the glucose.  Take 15 20 grams more of glucose if the repeat blood glucose level is still 70 mg/dL or below.  Eat a meal or snack within 1 hour once blood glucose levels return to normal.    Be alert to polyuria and polydipsia which are early signs of hyperglycemia. An early awareness of hyperglycemia allows for prompt treatment. Treat hyperglycemia as directed by your caregiver.  Engage in at least 150 minutes of moderate-intensity physical activity a week, spread over at least 3 days of the week or as directed by your caregiver. In addition, you should engage in resistance exercise at least 2 times a week or as directed by your caregiver.  Adjust your medicine and food intake as needed if you start a new exercise or sport.  Follow your sick day plan at any time you  are unable to eat or drink as usual.  Avoid tobacco use.  Limit alcohol intake to no more than 1 drink per day for nonpregnant women and 2 drinks per day for men. You should drink alcohol only when you are also eating food. Talk with your caregiver whether alcohol is safe for you. Tell your caregiver if you drink alcohol several times a week.  Follow up with your caregiver regularly.  Schedule an eye exam soon after the diagnosis of type 2 diabetes and then annually.  Perform daily skin and foot care. Examine your skin and feet daily for cuts, bruises, redness, nail problems, bleeding, blisters, or sores. A foot exam by a caregiver should be done annually.  Brush your teeth and gums at least twice a day and floss at least once a day. Follow up with your dentist regularly.  Share your diabetes management plan with your workplace or school.  Stay up-to-date with immunizations.  Learn to manage stress.  Obtain ongoing diabetes education and support as needed.  Participate in, or seek rehabilitation as needed to maintain or improve independence and quality of life. Request a physical or occupational therapy referral if you are having foot or hand numbness or difficulties with grooming,   dressing, eating, or physical activity. SEEK MEDICAL CARE IF:   You are unable to eat food or drink fluids for more than 6 hours.  You have nausea and vomiting for more than 6 hours.  Your blood glucose level is over 240 mg/dL.  There is a change in mental status.  You develop an additional serious illness.  You have diarrhea for more than 6 hours.  You have been sick or have had a fever for a couple of days and are not getting better.  You have pain during any physical activity.  SEEK IMMEDIATE MEDICAL CARE IF:  You have difficulty breathing.  You have moderate to large ketone levels. MAKE SURE YOU:  Understand these instructions.  Will watch your condition.  Will get help right away if  you are not doing well or get worse. Document Released: 05/28/2005 Document Revised: 02/20/2012 Document Reviewed: 12/25/2011 Exit[employer] Patient Information 2014 [employer].  

## 2013-01-08 ENCOUNTER — Encounter: Payer: Self-pay | Admitting: Internal Medicine

## 2013-01-08 DIAGNOSIS — F17200 Nicotine dependence, unspecified, uncomplicated: Secondary | ICD-10-CM | POA: Insufficient documentation

## 2013-01-08 DIAGNOSIS — E669 Obesity, unspecified: Secondary | ICD-10-CM | POA: Insufficient documentation

## 2013-01-08 DIAGNOSIS — Z6834 Body mass index (BMI) 34.0-34.9, adult: Secondary | ICD-10-CM | POA: Insufficient documentation

## 2013-01-08 MED ORDER — METFORMIN HCL 1000 MG PO TABS: 1000.0000 mg | ORAL_TABLET | Freq: Two times a day (BID) | ORAL | Status: DC

## 2013-01-08 MED ORDER — INSULIN DETEMIR 100 UNIT/ML FLEXPEN
50.0000 [IU] | PEN_INJECTOR | Freq: Every day | SUBCUTANEOUS | Status: DC
Start: 2013-01-08 — End: 2013-01-08

## 2013-01-08 MED ORDER — INSULIN DETEMIR 100 UNIT/ML FLEXPEN
50.0000 [IU] | PEN_INJECTOR | Freq: Every day | SUBCUTANEOUS | Status: DC
Start: 2013-01-08 — End: 2013-03-25

## 2013-01-08 NOTE — Assessment & Plan Note (Signed)
I have asked her to quit smoking

## 2013-01-08 NOTE — Assessment & Plan Note (Addendum)
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

## 2013-01-08 NOTE — Addendum Note (Signed)
Addended by: Etta Grandchild on: 01/08/2013 11:42 AM   Modules accepted: Orders

## 2013-01-08 NOTE — Assessment & Plan Note (Signed)
She agrees to work on her lifestyle modifications 

## 2013-01-08 NOTE — Progress Notes (Signed)
Subjective:    Patient ID: Joyce Green, female    DOB: 1986-04-21, 27 y.o.   MRN: [id number]  Diabetes She presents for her initial diabetic visit. She has type 2 diabetes mellitus. Her disease course has been worsening. There are no hypoglycemic associated symptoms. Pertinent negatives for hypoglycemia include no dizziness. Associated symptoms include polydipsia, polyphagia and polyuria. Pertinent negatives for diabetes include no blurred vision, no chest pain, no fatigue, no foot paresthesias, no foot ulcerations, no visual change, no weakness and no weight loss. There are no hypoglycemic complications. Symptoms are worsening. There are no diabetic complications. When asked about current treatments, none were reported. Her weight is increasing steadily. She is following a generally unhealthy diet. When asked about meal planning, she reported none. She has not had a previous visit with a dietician. She never participates in exercise. Her home blood glucose trend is increasing steadily. An ACE inhibitor/angiotensin II receptor blocker is contraindicated. She does not see a podiatrist.Eye exam is current.      Review of Systems  Constitutional: Negative.  Negative for fever, chills, weight loss, diaphoresis, appetite change and fatigue.  HENT: Negative.   Eyes: Negative.  Negative for blurred vision.  Respiratory: Negative.  Negative for apnea, cough, choking, shortness of breath, wheezing and stridor.   Cardiovascular: Negative.  Negative for chest pain, palpitations and leg swelling.  Gastrointestinal: Negative.  Negative for nausea, vomiting, abdominal pain, diarrhea and constipation.  Endocrine: Positive for polydipsia, polyphagia and polyuria.  Musculoskeletal: Negative.   Skin: Negative.   Allergic/Immunologic: Negative.   Neurological: Negative.  Negative for dizziness and weakness.  Hematological: Negative.  Negative for adenopathy. Does not bruise/bleed easily.   Psychiatric/Behavioral: Negative.        Objective:   Physical Exam  Vitals reviewed. Constitutional: She is oriented to person, place, and time. She appears well-developed and well-nourished. No distress.  HENT:  Head: Normocephalic and atraumatic.  Mouth/Throat: Oropharynx is clear and moist. No oropharyngeal exudate.  Eyes: Conjunctivae are normal. Right eye exhibits no discharge. Left eye exhibits no discharge. No scleral icterus.  Neck: Normal range of motion. Neck supple. No JVD present. No tracheal deviation present. No thyromegaly present.  Cardiovascular: Normal rate, regular rhythm, normal heart sounds and intact distal pulses.  Exam reveals no gallop and no friction rub.   No murmur heard. Pulmonary/Chest: Effort normal and breath sounds normal. No stridor. No respiratory distress. She has no wheezes. She has no rales. She exhibits no tenderness.  Abdominal: Soft. Bowel sounds are normal. She exhibits no distension and no mass. There is no tenderness. There is no rebound and no guarding.  Musculoskeletal: Normal range of motion. She exhibits no edema and no tenderness.  Lymphadenopathy:    She has no cervical adenopathy.  Neurological: She is oriented to person, place, and time.  Skin: Skin is warm and dry. No rash noted. She is not diaphoretic. No erythema. No pallor.  Psychiatric: She has a normal mood and affect. Her behavior is normal. Judgment and thought content normal.     Lab Results  Component Value Date   WBC 6.8 01/07/2013   HGB 13.1 01/07/2013   HCT 40.3 01/07/2013   PLT 204.0 01/07/2013   GLUCOSE 261* 01/07/2013   CHOL 133 01/07/2013   TRIG 43.0 01/07/2013   HDL 41.40 01/07/2013   LDLCALC 83 01/07/2013   ALT 140* 01/07/2013   AST 120* 01/07/2013   NA 134* 01/07/2013   K 4.1 01/07/2013   CL 103 01/07/2013  CREATININE 0.7 01/07/2013   BUN 8 01/07/2013   CO2 27 01/07/2013   TSH 1.52 01/07/2013   HGBA1C 10.9* 01/07/2013   MICROALBUR 0.4 01/07/2013        Assessment & Plan:

## 2013-01-14 LAB — ANTI-ISLET CELL ANTIBODY: Pancreatic Islet Cell Antibody: <5 JDF Units (ref <5)

## 2013-01-15 ENCOUNTER — Encounter: Payer: 59 | Admitting: Cardiology

## 2013-01-15 NOTE — Progress Notes (Signed)
   HPI: 27 year old female for fu of chest pain. Seen in the emergency room in May of 2014 for chest pain. Electrocardiogram showed no ST changes. Chest x-ray negative. Pregnancy test negative. Potassium 3.8. Glucose 174. Troponin negative. TSH normal. Echocardiogram in June of 2014 showed normal LV function, mild left ventricular hypertrophy. Event monitor in may of 2014 showed . TSH in July of 2014 normal.   Current Outpatient Prescriptions  Medication Sig Dispense Refill  . BIOTIN PO Take 1 tablet by mouth daily.      . Insulin Detemir (LEVEMIR FLEXTOUCH) 100 UNIT/ML SOPN Inject 50 Units into the skin at bedtime.  9 mL  3  . metFORMIN (GLUCOPHAGE) 1000 MG tablet Take 1 tablet (1,000 mg total) by mouth 2 (two) times daily with a meal.  180 tablet  1   No current facility-administered medications for this visit.     Past Medical History  Diagnosis Date  . Palpitations   . Diabetes mellitus without complication     No past surgical history on file.  History   Social History  . Marital Status: Single    Spouse Name: N/A    Number of Children: N/A  . Years of Education: N/A   Occupational History  . Not on file.   Social History Main Topics  . Smoking status: Current Every Day Smoker -- 0.25 packs/day for 7 years    Types: Cigarettes, Cigars  . Smokeless tobacco: Never Used  . Alcohol Use: 2.4 oz/week    2 Glasses of wine, 2 Cans of beer per week     Comment: vodka couple times month  . Drug Use: No  . Sexually Active: Yes -- Female partner(s)    Birth Control/ Protection: None   Other Topics Concern  . Not on file   Social History Narrative  . No narrative on file    ROS: no fevers or chills, productive cough, hemoptysis, dysphasia, odynophagia, melena, hematochezia, dysuria, hematuria, rash, seizure activity, orthopnea, PND, pedal edema, claudication. Remaining systems are negative.  Physical Exam: Well-developed well-nourished in no acute distress.  Skin is warm  and dry.  HEENT is normal.  Neck is supple.  Chest is clear to auscultation with normal expansion.  Cardiovascular exam is regular rate and rhythm.  Abdominal exam nontender or distended. No masses palpated. Extremities show no edema. neuro grossly intact  ECG     This encounter was created in error - please disregard.

## 2013-01-27 ENCOUNTER — Ambulatory Visit: Payer: 59 | Admitting: *Deleted

## 2013-01-28 ENCOUNTER — Telehealth: Payer: Self-pay | Admitting: *Deleted

## 2013-01-28 ENCOUNTER — Other Ambulatory Visit: Payer: Self-pay | Admitting: Internal Medicine

## 2013-01-28 DIAGNOSIS — IMO0001 Reserved for inherently not codable concepts without codable children: Secondary | ICD-10-CM

## 2013-01-28 NOTE — Telephone Encounter (Signed)
Pt called states pharmacy contacted her that DM medications were ready for pick up.  Pt requests DM teaching.  Spoke with Dr Yetta Barre referral sent.  Pt notified she would be contacted by a [facility] for follow up

## 2013-02-25 ENCOUNTER — Encounter: Payer: Self-pay | Admitting: *Deleted

## 2013-02-25 ENCOUNTER — Encounter: Payer: 59 | Attending: Internal Medicine | Admitting: *Deleted

## 2013-02-25 VITALS — Ht 70.0 in | Wt 230.4 lb

## 2013-02-25 DIAGNOSIS — E119 Type 2 diabetes mellitus without complications: Secondary | ICD-10-CM | POA: Insufficient documentation

## 2013-02-25 DIAGNOSIS — Z713 Dietary counseling and surveillance: Secondary | ICD-10-CM | POA: Insufficient documentation

## 2013-02-25 DIAGNOSIS — IMO0001 Reserved for inherently not codable concepts without codable children: Secondary | ICD-10-CM

## 2013-02-25 NOTE — Progress Notes (Signed)
Appt start time: 1630 end time:  1800.   Assessment:  Patient was seen on  02/25/13 for individual diabetes education. Ms Charlesetta Shanks comes for DSME related to new diagnosis of T2DM. She accompanied by fiance' with whom she lives. She has been taking Metformin since 01/08/13. We will begin Levemir and glucose testing today. Both parties do the grocery shopping, Preethi does most of the cooking (when I cook). 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 I will fax Dr. Yetta Barre' office for orders for testing supplies and pen needles  Current HbA1c: 10.9%, FBS 261 on 01/07/13  MEDICATIONS: Metformin, Levemir   DIETARY INTAKE:  Usual eating pattern includes 3 meals and graze through the day at work @ a call center.  Everyday foods include variety of foods with large emphasis on carbohydrates.  Don't eat: Milk   24-hr recall:  B ( AM): jello single serving, water Snk ( AM): none  L ( PM): french fries,buffalo bites, water Snk ( PM): chips, almonds D ( PM): [place], bread dressing, sweet potatoes Snk ( PM): none Beverages: water, diet soda,   Usual physical activity: none   Nutritional Diagnosis:  NB-1.1 Food and nutrition-related knowledge deficit As related to elevated glucose.  As evidenced by A1c 10.9%.    Intervention:  Nutrition counseling provided.  Discussed diabetes disease process and treatment options.  Discussed physiology of diabetes and role of obesity on insulin resistance.  Encouraged moderate weight reduction to improve glucose levels.  Discussed role of medications and diet in glucose control  Provided education on macronutrients on glucose levels.  Provided education on carb counting, importance of regularly scheduled meals/snacks, and meal planning  Discussed effects of physical activity on glucose levels and long-term glucose control.  Recommended 150 minutes of physical activity/week.  Reviewed patient medications.  Discussed role of medication on  blood glucose and possible side effects  Discussed blood glucose monitoring and interpretation.  Discussed recommended target ranges and individual ranges.    Described short-term complications: hyper- and hypo-glycemia.  Discussed causes,symptoms, and treatment options.  Discussed prevention, detection, and treatment of long-term complications.  Discussed the role of prolonged elevated glucose levels on body systems.  Discussed role of stress on blood glucose levels and discussed strategies to manage psychosocial issues.  Discussed recommendations for long-term diabetes self-care.  Established checklist for medical, dental, and emotional self-care.  [employer] Che[employer] Nano BG Monitoring Kit dispensed. Demonstration with teach back Lot# [id number] Exp: 05/10/14  Instruction on insulin administration provided. Levemir 50units sq given at 1750 by patient. Patient to test glucose before bed tonight. If less than 110 to have snack. To test in the morning before breakfast and email me the results.  Plan:  Eat by the plate method. Balanced meals three times per day. Consider  increasing your activity level three times per week as tolerated Begin checking BG at alternate times per day to include fasting in the morning and 2 hours after dinner as directed by MD  Begin taking Levemir medication consistantly at the same time in the evening as directed by MD See Dr. Sanda Linger in two weeks. Return to see Harriett Sine at [facility] in 4 weeks.  Handouts given during visit include: Living Well with Diabetes Carb Counting handouts Meal Plan Card Plate Method  Barriers to learning/adherance to lifestyle change: motivation  Diabetes self-care support plan:   [facility] support group  Fiance'  Monitoring/Evaluation:  Dietary intake, exercise, glucose monitoring, and body weight follow up  in 4 week(s).

## 2013-02-25 NOTE — Patient Instructions (Addendum)
Plan:  Eat by the plate method. Balanced meals three times per day. Consider  increasing your activity level three times per week as tolerated Begin checking BG at alternate times per day to include fasting in the morning and 2 hours after dinner as directed by MD  Consider taking medication consistantly at the same time in the evening as directed by MD Contact Dr. Sanda Linger office to be see in 2 weeks. Return to see Harriett Sine in 4 weeks.

## 2013-02-27 ENCOUNTER — Other Ambulatory Visit: Payer: Self-pay

## 2013-02-27 DIAGNOSIS — IMO0001 Reserved for inherently not codable concepts without codable children: Secondary | ICD-10-CM

## 2013-02-27 MED ORDER — INSULIN PEN NEEDLE 31G X 5 MM MISC: Status: DC

## 2013-02-27 MED ORDER — ACCU-CHEK SOFT TOUCH LANCETS MISC: Status: DC

## 2013-02-27 MED ORDER — GLUCOSE BLOOD VI STRP: ORAL_STRIP | Status: DC

## 2013-03-10 ENCOUNTER — Other Ambulatory Visit: Payer: Self-pay

## 2013-03-10 MED ORDER — ACCU-CHEK SOFT TOUCH LANCETS MISC: Status: DC

## 2013-03-10 MED ORDER — INSULIN PEN NEEDLE 31G X 5 MM MISC: Status: DC

## 2013-03-10 MED ORDER — GLUCOSE BLOOD VI STRP: ORAL_STRIP | Status: DC

## 2013-03-25 ENCOUNTER — Other Ambulatory Visit: Payer: Self-pay

## 2013-03-25 DIAGNOSIS — IMO0001 Reserved for inherently not codable concepts without codable children: Secondary | ICD-10-CM

## 2013-03-25 DIAGNOSIS — R7309 Other abnormal glucose: Secondary | ICD-10-CM

## 2013-03-25 MED ORDER — ACCU-CHEK SOFT TOUCH LANCETS MISC: Status: DC

## 2013-03-25 MED ORDER — INSULIN DETEMIR 100 UNIT/ML FLEXPEN: 50.0000 [IU] | PEN_INJECTOR | Freq: Every day | SUBCUTANEOUS | Status: DC

## 2013-03-25 MED ORDER — INSULIN PEN NEEDLE 31G X 5 MM MISC: Status: DC

## 2013-03-25 MED ORDER — GLUCOSE BLOOD VI STRP: ORAL_STRIP | Status: DC

## 2013-03-25 MED ORDER — METFORMIN HCL 1000 MG PO TABS: 1000.0000 mg | ORAL_TABLET | Freq: Two times a day (BID) | ORAL | Status: DC

## 2013-03-25 NOTE — Telephone Encounter (Signed)
[facility] mail order request for diabetic supplies and levemir.

## 2013-03-31 ENCOUNTER — Ambulatory Visit: Payer: 59 | Admitting: *Deleted

## 2013-04-03 ENCOUNTER — Ambulatory Visit: Payer: 59 | Admitting: *Deleted

## 2013-08-20 LAB — OB RESULTS CONSOLE ABO/RH: RH TYPE: POSITIVE

## 2013-08-20 LAB — OB RESULTS CONSOLE HIV ANTIBODY (ROUTINE TESTING): HIV: NONREACTIVE

## 2013-08-20 LAB — OB RESULTS CONSOLE VARICELLA ZOSTER ANTIBODY, IGG: VARICELLA IGG: IMMUNE

## 2013-08-20 LAB — OB RESULTS CONSOLE HGB/HCT, BLOOD
HCT: 36 %
HEMOGLOBIN: 11.9 g/dL

## 2013-08-20 LAB — OB RESULTS CONSOLE GC/CHLAMYDIA
Chlamydia: NEGATIVE
Gonorrhea: NEGATIVE

## 2013-08-20 LAB — SICKLE CELL SCREEN

## 2013-08-20 LAB — OB RESULTS CONSOLE ANTIBODY SCREEN: ANTIBODY SCREEN: NEGATIVE

## 2013-08-20 LAB — OB RESULTS CONSOLE RPR: RPR: NONREACTIVE

## 2013-08-20 LAB — OB RESULTS CONSOLE RUBELLA ANTIBODY, IGM: Rubella: IMMUNE

## 2013-09-17 ENCOUNTER — Ambulatory Visit (INDEPENDENT_AMBULATORY_CARE_PROVIDER_SITE_OTHER): Payer: No Typology Code available for payment source

## 2013-09-17 DIAGNOSIS — N926 Irregular menstruation, unspecified: Secondary | ICD-10-CM

## 2013-09-17 DIAGNOSIS — Z3201 Encounter for pregnancy test, result positive: Secondary | ICD-10-CM

## 2013-09-17 LAB — POCT PREGNANCY, URINE: Preg Test, Ur: POSITIVE — AB

## 2013-09-17 LAB — CULTURE, OB URINE: URINE CULTURE, OB: NEGATIVE

## 2013-09-17 NOTE — Progress Notes (Addendum)
Pt. Here today for pregnancy test to show proof of pregnancy. UPT positive. [facility]; prenatal panel complete-- records in hand will have them scanned in and abstracted. Anatomy ultrasound scheduled today 4/22 at 0830. Pt. Scheduled for New OB appointment Monday April 20th in high risk clinic because DM. Pt. Reports not taking her metformin and insulin because she was unsure if it would hurt baby; informed her that she should still take medication as instructed to keep blood sugars WNL and that she will meet with DM educator and nutritionist to discuss further management and plan of care. Pt. Verbalized understanding and stated she would-- reports sugars have been 150s since she has not been taking insulin and metformin, and states she will start taking them again.

## 2013-09-23 ENCOUNTER — Encounter: Payer: Self-pay | Admitting: *Deleted

## 2013-09-28 ENCOUNTER — Ambulatory Visit (INDEPENDENT_AMBULATORY_CARE_PROVIDER_SITE_OTHER): Payer: No Typology Code available for payment source | Admitting: Obstetrics & Gynecology

## 2013-09-28 ENCOUNTER — Encounter: Payer: No Typology Code available for payment source | Attending: Obstetrics & Gynecology | Admitting: *Deleted

## 2013-09-28 ENCOUNTER — Encounter: Payer: Self-pay | Admitting: Obstetrics & Gynecology

## 2013-09-28 VITALS — BP 124/88 | HR 99 | Temp 99.2°F | Wt 224.5 lb

## 2013-09-28 DIAGNOSIS — O0990 Supervision of high risk pregnancy, unspecified, unspecified trimester: Secondary | ICD-10-CM | POA: Insufficient documentation

## 2013-09-28 DIAGNOSIS — Z713 Dietary counseling and surveillance: Secondary | ICD-10-CM | POA: Insufficient documentation

## 2013-09-28 DIAGNOSIS — O24319 Unspecified pre-existing diabetes mellitus in pregnancy, unspecified trimester: Secondary | ICD-10-CM

## 2013-09-28 DIAGNOSIS — E119 Type 2 diabetes mellitus without complications: Secondary | ICD-10-CM

## 2013-09-28 DIAGNOSIS — E669 Obesity, unspecified: Secondary | ICD-10-CM | POA: Insufficient documentation

## 2013-09-28 DIAGNOSIS — Z794 Long term (current) use of insulin: Secondary | ICD-10-CM | POA: Insufficient documentation

## 2013-09-28 DIAGNOSIS — O24919 Unspecified diabetes mellitus in pregnancy, unspecified trimester: Secondary | ICD-10-CM

## 2013-09-28 DIAGNOSIS — Z23 Encounter for immunization: Secondary | ICD-10-CM

## 2013-09-28 LAB — CBC
HCT: 32.3 % — ABNORMAL LOW (ref 36.0–46.0)
HEMOGLOBIN: 11.2 g/dL — AB (ref 12.0–15.0)
MCH: 27.3 pg (ref 26.0–34.0)
MCHC: 34.7 g/dL (ref 30.0–36.0)
MCV: 78.6 fL (ref 78.0–100.0)
Platelets: 220 10*3/uL (ref 150–400)
RBC: 4.11 MIL/uL (ref 3.87–5.11)
RDW: 14.9 % (ref 11.5–15.5)
WBC: 7.4 10*3/uL (ref 4.0–10.5)

## 2013-09-28 LAB — COMPREHENSIVE METABOLIC PANEL
ALBUMIN: 3.5 g/dL (ref 3.5–5.2)
ALT: 10 U/L (ref 0–35)
AST: 13 U/L (ref 0–37)
Alkaline Phosphatase: 46 U/L (ref 39–117)
BUN: 7 mg/dL (ref 6–23)
CALCIUM: 8.5 mg/dL (ref 8.4–10.5)
CHLORIDE: 105 meq/L (ref 96–112)
CO2: 21 mEq/L (ref 19–32)
CREATININE: 0.5 mg/dL (ref 0.50–1.10)
GLUCOSE: 86 mg/dL (ref 70–99)
Potassium: 3.9 mEq/L (ref 3.5–5.3)
Sodium: 134 mEq/L — ABNORMAL LOW (ref 135–145)
Total Bilirubin: 0.3 mg/dL (ref 0.2–1.2)
Total Protein: 6.2 g/dL (ref 6.0–8.3)

## 2013-09-28 LAB — TSH: TSH: 1.883 u[IU]/mL (ref 0.350–4.500)

## 2013-09-28 LAB — POCT URINALYSIS DIP (DEVICE)
Bilirubin Urine: NEGATIVE
Glucose, UA: NEGATIVE mg/dL
KETONES UR: NEGATIVE mg/dL
Leukocytes, UA: NEGATIVE
Nitrite: NEGATIVE
PH: 5.5 (ref 5.0–8.0)
Protein, ur: NEGATIVE mg/dL
SPECIFIC GRAVITY, URINE: 1.025 (ref 1.005–1.030)
UROBILINOGEN UA: 1 mg/dL (ref 0.0–1.0)

## 2013-09-28 MED ORDER — ASPIRIN EC 81 MG PO TBEC: 81.0000 mg | DELAYED_RELEASE_TABLET | Freq: Every day | ORAL | Status: DC

## 2013-09-28 NOTE — Progress Notes (Signed)
Here for initial prenatal visit. Given new patient information. Discussed bmi/ appropriate weight gain. C/o pain in left foot more than right foot.

## 2013-09-28 NOTE — Progress Notes (Signed)
Nutrition note: 1st visit consult & DM diet education Pt has h/o obesity & has had DM since July 2014- takes insulin & metformin Pt has gained 4.5# @ 4686w4d, which is wnl. Pt reports eating 2 meals & 2 snacks/d. Pt is taking a PNV. Pt reports no N/V or heartburn. Pt received verbal & written review of DM diet during pregnancy. Discussed importance of physical activity. Discussed wt gain goals of 11-20# or 0.5#/wk. Pt agrees to follow DM diet with 3 meals & 2-3 snacks/d with proper CHO/ protein combination. Pt does not have WIC but plans to apply. Pt plans to BF. F/u in 4-6 wks Blondell RevealLaura Valia Wingard, MS, RD, LDN, [facility]

## 2013-09-28 NOTE — Progress Notes (Signed)
Fetal Echo scheduled 10/13/13 at 830 am with Dr. Elizebeth Brookingotton.

## 2013-09-28 NOTE — Patient Instructions (Signed)
Return to clinic for any obstetric concerns or go to [facility] for evaluation Type 1 or Type 2 Diabetes Mellitus During Pregnancy Diabetes mellitus, often simply referred to as diabetes, is a long-term (chronic) disease. Type 1 diabetes occurs when the islet cells in the pancreas that make insulin (a hormone) are destroyed and can no longer make insulin. Type 2 diabetes occurs when the pancreas does not make enough insulin, the cells are less responsive to the insulin that is made (insulin resistance), or both. Insulin is needed to move sugars from food into the tissue cells. The tissue cells use the sugars for energy. The lack of insulin or the lack of normal response to insulin causes excess sugars to build up in the blood instead of going into the tissue cells. As a result, high blood sugar (hyperglycemia) develops.  If blood glucose levels are kept in the normal range both before and during pregnancy, women can have a healthy pregnancy. If your blood glucose levels are not well controlled, there may be risks to you, your unborn baby (fetus), your labor and delivery, or your newborn baby.  RISK FACTORS  You are predisposed to developing type 1 diabetes if someone in your family has diabetes and you are exposed to certain environmental triggers.  You have an increased chance of developing type 2 diabetes if you have a family history of diabetes and also have one or more of the following risk factors:  Being overweight.  Having an inactive lifestyle.  Having a history of consistently eating high-calorie foods. SYMPTOMS The symptoms of diabetes include:  Increased thirst (polydipsia).  Increased urination (polyuria).  Increased urination during the night (nocturia).  Weight loss. This weight loss may be rapid.  Frequent, recurring infections.  Tiredness (fatigue).  Weakness.  Vision changes, such as blurred vision.  Fruity smell to your breath.  Abdominal pain.  Nausea or  vomiting. DIAGNOSIS  Diabetes is diagnosed when blood glucose levels are increased. Your blood glucose level may be checked by one or more of the following blood tests:  A fasting blood glucose test. You will not be allowed to eat for at least 8 hours before a blood sample is taken.  A random blood glucose test. Your blood glucose is checked at any time of the day regardless of when you ate.  A hemoglobin A1c blood glucose test. A hemoglobin A1c test provides information about blood glucose control over the previous 3 months.  An oral glucose tolerance test (OGTT). Your blood glucose is measured after you have not eaten (fasted) for 1 3 hours and then after you drink a glucose-containing beverage. An OGTT is usually performed during weeks 24 28 of your pregnancy. TREATMENT   You will need to take diabetes medicine or insulin daily to keep blood glucose levels in the desired range.  You will need to match insulin dosing with exercise and healthy food choices. The treatment goal is to maintain the before-meal (preprandial), bedtime, and overnight blood glucose level at 60 99 mg/dL during pregnancy. The treatment goal is to further maintain the peak after-meal blood sugar (postprandial glucose) level at 100 140 mg/dL.  HOME CARE INSTRUCTIONS   Have your hemoglobin A1c level checked twice a year.  Perform daily blood glucose monitoring as directed by your caregiver. It is common to perform frequent blood glucose monitoring.  Monitor urine ketones when you are ill and as directed by your caregiver.  Take your diabetes medicine and insulin as directed by your caregiver to  maintain your blood glucose level in the desired range.  Never run out of diabetes medicine or insulin. It is needed every day.  Adjust insulin based on your intake of carbohydrates. Carbohydrates can raise blood glucose levels but need to be included in your diet. Carbohydrates provide vitamins, minerals, and fiber,  which are an essential part of a healthy diet. Carbohydrates are found in fruits, vegetables, whole grains, dairy products, legumes, and foods containing added sugars.  Eat healthy foods. Alternate 3 meals with 3 snacks.  Maintain a healthy weight gain. The usual total expected weight gain varies according to your prepregnancy body mass index (BMI).  Carry a medical alert card or wear medical alert jewelry.  Carry a 15 gram carbohydrate snack with you at all times to treat low blood sugar (hypoglycemia). Some examples of 15 gram carbohydrate snacks include:  Glucose tablets, 3 or 4.  Glucose gel, 15 gram tube.  Raisins, 2 tablespoons (24 grams).  Jelly beans, 6.  Animal crackers, 8.  Fruit juice, regular soda, or low fat milk, 4 ounces (120 mL).  Gummy treats, 9.  Recognize hypoglycemia. Hypoglycemia during pregnancy occurs with blood glucose levels of 60 mg/dL and below. The risk for hypoglycemia increases when fasting or skipping meals, during or after intense exercise, and during sleep. Hypoglycemia symptoms can include:  Tremors or shakes.  Decreased ability to concentrate.  Sweating.  Increased heart rate.  Headache.  Dry mouth.  Hunger.  Irritability.  Anxiety.  Restless sleep.  Altered speech or coordination.  Confusion.  Treat hypoglycemia promptly. If you are alert and able to safely swallow, follow the 15:15 rule:  Take 15 20 grams of rapid-acting glucose or carbohydrate. Rapid-acting options include glucose gel, glucose tablets, or 4 ounces (120 mL) of fruit juice, regular soda, or low-fat milk.  Check your blood glucose level 15 minutes after taking the glucose.  Take 15 20 grams more of glucose if the repeat blood glucose level is still 70 mg/dL or below.  Eat a meal or snack within 1 hour once blood glucose levels return to normal.  Engage in at least 30 minutes of physical activity a day or as directed by your caregiver. Ten minutes of  physical activity timed 30 minutes after each meal is encouraged to control postprandial blood glucose levels.   Be alert to polyuria and polydipsia, which are early signs of hyperglycemia. An early awareness of hyperglycemia allows for prompt treatment. Treat hyperglycemia as directed by your caregiver.  Adjust your insulin dosing and food intake as needed if you start a new exercise or sport.  Follow your sick day plan at any time you are unable to eat or drink as usual.  Avoid tobacco and alcohol use.  Follow up with your caregiver regularly.  Follow the advice of your caregiver regarding your prenatal and post-delivery (postpartum) appointments, meal planning, exercise, medicines, vitamins, blood tests, other medical tests, and physical activities.  Continue daily skin and foot care. Examine your skin and feet daily for cuts, bruises, redness, nail problems, bleeding, blisters, or sores. A foot exam by a caregiver should be done annually.  Brush your teeth and gums at least twice a day and floss at least once a day. Follow up with your dentist regularly.  Schedule an eye exam during the first trimester of your pregnancy or as directed by your caregiver.  Share your diabetes management plan with your workplace or school.  Stay up-to-date with immunizations.  Learn to manage stress.  Obtain  ongoing diabetes education and support as needed. SEEK MEDICAL CARE IF:   You are unable to eat food or drink fluids for more than 6 hours.  You have nausea and vomiting for more than 6 hours.  You have a blood glucose level of 200 mg/dL and you have ketones in your urine.  There is a change in mental status.  You develop vision problems.  You have a persistent headache.  You have upper abdominal pain or discomfort.  You develop an additional serious illness.  You have diarrhea for more than 6 hours.  You have been sick or have had a fever for a couple of days and are not getting  better. SEEK IMMEDIATE MEDICAL CARE IF:  You have difficulty breathing.  You no longer feel the baby moving.  You are bleeding or have discharge from your vagina.  You start having premature contractions or labor. MAKE SURE YOU:  Understand these instructions.  Will watch your condition.  Will get help right away if you are not doing well or get worse. Document Released: 02/20/2012 Document Revised: 05/14/2012 Document Reviewed: 02/20/2012 [facility] Patient Information 2014 [place], [place].

## 2013-09-28 NOTE — Progress Notes (Signed)
   Subjective:   Benard HalstedBronzley Thompson is being seen today for her first obstetrical visit.  This is a planned pregnancy. She is G1P0 at 753w4d gestation. Her history is significant for insulin dependent Type II DM. Relationship with FOB: spouse, living together. Patient does intend to breast feed. Pregnancy history fully reviewed.  Of note, patient was seen at [facility] earlier this pregnancy and had some labs done, no physical exam.  She was transferred here due to insurance issues.  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  No ulcers, no lesions. Just "pins and needles" sensation. No other issues.  Menstrual History: OB History   Grav Para Term Preterm Abortions TAB SAB Ect Mult Living   1               Patient's last menstrual period was 05/21/2013.    The following portions of the patient's history were reviewed and updated as appropriate: allergies, current medications, past family history, past medical history, past social history, past surgical history and problem list.  Last normal pap was last year.  Review of Systems Pertinent items are noted in HPI.    Objective:   BP 124/88  Pulse 99  Temp(Src) 99.2 F (37.3 C)  Wt 224 lb 8 oz (101.833 kg)  LMP 05/21/2013 GENERAL: Well-developed, well-nourished female in no acute distress.  HEENT: Normocephalic, atraumatic. Sclerae anicteric.  NECK: Supple. Normal thyroid.  LUNGS: Clear to auscultation bilaterally.  HEART: Regular rate and rhythm. BREASTS: Symmetric in size. No masses, skin changes, nipple drainage, or lymphadenopathy. ABDOMEN: Soft, gravid, nontender, nondistended. No organomegaly. PELVIC: Normal external female genitalia. Vagina is pink and rugated.  Normal discharge. Normal cervix contour. Pap smear obtained. Uterus is 18 week size.  Average pelvis. No adnexal mass or tenderness.  EXTREMITIES: No cyanosis, clubbing, or edema, 2+ distal pulses.     Assessment:   Insulin dependent Type II DM, [redacted]w[redacted]d pregnancy   Plan:   Problem list reviewed and updated. Initial labs reviewed. Continue prenatal vitamins and current insulin management with Detemir for now.  To meet with DM educator today; also Nutritionist. HgA1C checked, in addition to baseline DM labs.  Aspirin 81 mg po daily prescribed, fetal ECHO scheduled. Quad screen discussed: ordered. Role of ultrasound in pregnancy discussed; fetal survey: ordered. Follow up in 1 week to evaluate blood sugars Patient to [facility] up at [facility] given [place] [facility] consultation recommended for evaluation of her feet, patient to arrange this  DM Checklist (discussed all parts with patient) [ ]  Baseline labs and 24 hour urine - ordered [x]  Baby ASA prescribed [ ]  Fetal ECHO - ordered [ ]  Optho exam - Patient to follow up with opthalmologist [ ]  Serial growth scans 20-24-28-32-35-38 [ ]  Antenatal testing starting at 32 weeks [ ]  Delivery by 39 weeks or earlier if needed  Routine obstetric precautions reviewed.   Jaynie CollinsUGONNA  ANYANWU, MD, FACOG Attending Obstetrician & Gynecologist Faculty Practice, [facility]

## 2013-09-28 NOTE — Progress Notes (Signed)
DIABETES: Ms Joyce Green [facility]. She received 1:1 education at time of new onset. She is presently on Levemir and Metformin. Her present FBS is approx 150mg /dl. We reviewed the following:   States why dietary management is important in controlling blood glucose  Describes the effects of carbohydrates on blood glucose levels  Demonstrates ability to create a balanced meal plan  Demonstrates carbohydrate counting   States when to check blood glucose levels  Demonstrates proper blood glucose monitoring techniques  States the effect of stress and exercise on blood glucose levels  States the importance of limiting caffeine and abstaining from alcohol and smoking  Plan:  Aim for 2 Carb Choices per meal (30 grams) +/- 1 either way for breakfast Aim for 3 Carb Choices per meal (45 grams) +/- 1 either way from lunch and dinner Aim for 1-2 Carbs per snack Begin reading food labels for Total Carbohydrate and sugar grams of foods Consider  increasing your activity level by walking daily as tolerated Begin checking BG before breakfast and 2 hours after first bit of breakfast, lunch and dinner after  as directed by MD  Take medication  as directed by MD  Patient instructed to monitor glucose levels: FBS: 60 - <90 2 hour: <120  Patient received the following handouts:  Nutrition Diabetes and Pregnancy  Carbohydrate Counting List  Meal Planning worksheet  Patient will be seen for follow-up as needed.

## 2013-09-29 ENCOUNTER — Encounter: Payer: Self-pay | Admitting: Obstetrics & Gynecology

## 2013-09-29 LAB — HEMOGLOBIN A1C
HEMOGLOBIN A1C: 6.6 % — AB (ref <5.7)
Mean Plasma Glucose: 143 mg/dL — ABNORMAL HIGH (ref <117)

## 2013-09-29 LAB — PROTEIN / CREATININE RATIO, URINE
Creatinine, Urine: 137.7 mg/dL
PROTEIN CREATININE RATIO: 0.05 (ref <0.15)
TOTAL PROTEIN, URINE: 7 mg/dL

## 2013-09-30 ENCOUNTER — Ambulatory Visit (HOSPITAL_COMMUNITY): Payer: No Typology Code available for payment source

## 2013-09-30 LAB — PRESCRIPTION MONITORING PROFILE (19 PANEL)
AMPHETAMINE/METH: NEGATIVE ng/mL
BARBITURATE SCREEN, URINE: NEGATIVE ng/mL
BUPRENORPHINE, URINE: NEGATIVE ng/mL
Benzodiazepine Screen, Urine: NEGATIVE ng/mL
CREATININE, URINE: 140.27 mg/dL (ref >20.0)
Carisoprodol, Urine: NEGATIVE ng/mL
Cocaine Metabolites: NEGATIVE ng/mL
Fentanyl, Ur: NEGATIVE ng/mL
MDMA URINE: NEGATIVE ng/mL
Meperidine, Ur: NEGATIVE ng/mL
Methadone Screen, Urine: NEGATIVE ng/mL
Methaqualone: NEGATIVE ng/mL
Nitrites, Initial: NEGATIVE ug/mL
OXYCODONE SCRN UR: NEGATIVE ng/mL
Opiate Screen, Urine: NEGATIVE ng/mL
Phencyclidine, Ur: NEGATIVE ng/mL
Propoxyphene: NEGATIVE ng/mL
TAPENTADOLUR: NEGATIVE ng/mL
Tramadol Scrn, Ur: NEGATIVE ng/mL
ZOLPIDEM, URINE: NEGATIVE ng/mL
pH, Initial: 5.9 pH (ref 4.5–8.9)

## 2013-09-30 LAB — AFP, QUAD SCREEN
AFP: 30.4 [IU]/mL
CURR GEST AGE: 18.4 wks.days
Down Syndrome Scr Risk Est: 1:9550 {titer}
HCG, Total: 21150 m[IU]/mL
INH: 170.5 pg/mL
Interpretation-AFP: NEGATIVE
MOM FOR INH: 1.18
MoM for AFP: 1.08
MoM for hCG: 1.55
Open Spina bifida: NEGATIVE
Osb Risk: 1:5870 {titer}
Tri 18 Scr Risk Est: NEGATIVE
Trisomy 18 (Edward) Syndrome Interp.: 1:102000 {titer}
uE3 Mom: 1.3
uE3 Value: 1 ng/mL

## 2013-09-30 LAB — URINE CULTURE

## 2013-09-30 LAB — CANNABANOIDS (GC/LC/MS), URINE: THC-COOH (GC/LC/MS), ur confirm: 100 ng/mL — AB

## 2013-10-01 ENCOUNTER — Encounter: Payer: Self-pay | Admitting: Obstetrics & Gynecology

## 2013-10-01 ENCOUNTER — Other Ambulatory Visit: Payer: Self-pay | Admitting: Obstetrics & Gynecology

## 2013-10-01 ENCOUNTER — Ambulatory Visit (HOSPITAL_COMMUNITY)
Admission: RE | Admit: 2013-10-01 | Discharge: 2013-10-01 | Disposition: A | Discharge status: Home / Self Care | Payer: No Typology Code available for payment source | Source: Ambulatory Visit | Attending: Obstetrics & Gynecology | Admitting: Obstetrics & Gynecology

## 2013-10-01 DIAGNOSIS — Z3689 Encounter for other specified antenatal screening: Secondary | ICD-10-CM | POA: Insufficient documentation

## 2013-10-01 DIAGNOSIS — Z3201 Encounter for pregnancy test, result positive: Secondary | ICD-10-CM

## 2013-10-01 DIAGNOSIS — O0990 Supervision of high risk pregnancy, unspecified, unspecified trimester: Secondary | ICD-10-CM

## 2013-10-24 ENCOUNTER — Encounter: Payer: Self-pay | Admitting: *Deleted

## 2013-11-06 ENCOUNTER — Ambulatory Visit (INDEPENDENT_AMBULATORY_CARE_PROVIDER_SITE_OTHER): Payer: No Typology Code available for payment source | Admitting: Family Medicine

## 2013-11-06 ENCOUNTER — Encounter: Payer: Self-pay | Admitting: Family Medicine

## 2013-11-06 VITALS — BP 122/89 | HR 102 | Wt 227.8 lb

## 2013-11-06 DIAGNOSIS — O24319 Unspecified pre-existing diabetes mellitus in pregnancy, unspecified trimester: Secondary | ICD-10-CM

## 2013-11-06 DIAGNOSIS — O24919 Unspecified diabetes mellitus in pregnancy, unspecified trimester: Secondary | ICD-10-CM

## 2013-11-06 DIAGNOSIS — E119 Type 2 diabetes mellitus without complications: Secondary | ICD-10-CM

## 2013-11-06 MED ORDER — METFORMIN HCL 1000 MG PO TABS
1000.0000 mg | ORAL_TABLET | Freq: Two times a day (BID) | ORAL | Status: DC
Start: 2013-11-06 — End: 2015-10-10

## 2013-11-06 MED ORDER — GLUCOSE BLOOD VI STRP: ORAL_STRIP | Status: DC

## 2013-11-06 NOTE — Patient Instructions (Addendum)
Following an appropriate diet and keeping your blood sugar under control is the most important thing to do for your health and that of your unborn baby.  Please check your blood sugar 4 times daily.  Please keep accurate BS logs and bring them with you to every visit.  Please bring your meter also.  Goals for Blood sugar should be: 1. Fasting (first thing in the morning before eating) should be less than 90.   2.  2 hours after meals should be less than 120.  Please eat 3 meals and 3 snacks.  Include protein (meat, dairy-cheese, eggs, nuts) with all meals.  Be mindful that carbohydrates increase your blood sugar.  Not just sweet food (cookies, cake, donuts, fruit, juice, soda) but also bread, pasta, rice, and potatoes.  You have to limit how many carbs you are eating.  Adding exercise, as little as 30 minutes a day can decrease your blood sugar.  Gestational Diabetes Mellitus Gestational diabetes mellitus, often simply referred to as gestational diabetes, is a type of diabetes that some women develop during pregnancy. In gestational diabetes, the pancreas does not make enough insulin (a hormone), the cells are less responsive to the insulin that is made (insulin resistance), or both.Normally, insulin moves sugars from food into the tissue cells. The tissue cells use the sugars for energy. The lack of insulin or the lack of normal response to insulin causes excess sugars to build up in the blood instead of going into the tissue cells. As a result, high blood sugar (hyperglycemia) develops. The effect of high sugar (glucose) levels can cause many complications.  RISK FACTORS You have an increased chance of developing gestational diabetes if you have a family history of diabetes and also have one or more of the following risk factors:  A body mass index over 30 (obesity).  A previous pregnancy with gestational diabetes.  An older age at the time of pregnancy. If blood glucose levels are kept  in the normal range during pregnancy, women can have a healthy pregnancy. If your blood glucose levels are not well controlled, there may be risks to you, your unborn baby (fetus), your labor and delivery, or your newborn baby.  SYMPTOMS  If symptoms are experienced, they are much like symptoms you would normally expect during pregnancy. The symptoms of gestational diabetes include:   Increased thirst (polydipsia).  Increased urination (polyuria).  Increased urination during the night (nocturia).  Weight loss. This weight loss may be rapid.  Frequent, recurring infections.  Tiredness (fatigue).  Weakness.  Vision changes, such as blurred vision.  Fruity smell to your breath.  Abdominal pain. DIAGNOSIS Diabetes is diagnosed when blood glucose levels are increased. Your blood glucose level may be checked by one or more of the following blood tests:  A fasting blood glucose test. You will not be allowed to eat for at least 8 hours before a blood sample is taken.  A random blood glucose test. Your blood glucose is checked at any time of the day regardless of when you ate.  A hemoglobin A1c blood glucose test. A hemoglobin A1c test provides information about blood glucose control over the previous 3 months.  An oral glucose tolerance test (OGTT). Your blood glucose is measured after you have not eaten (fasted) for 1 3 hours and then after you drink a glucose-containing beverage. Since the hormones that cause insulin resistance are highest at about 24 28 weeks of a pregnancy, an OGTT is usually performed during that   time. If you have risk factors for gestational diabetes, your caregiver may test you for gestational diabetes earlier than 24 weeks of pregnancy. TREATMENT   You will need to take diabetes medicine or insulin daily to keep blood glucose levels in the desired range.  You will need to match insulin dosing with exercise and healthy food choices. The treatment goal is to  maintain the before meal (preprandial), bedtime, and overnight blood glucose level at 60 99 mg/dL during pregnancy. The treatment goal is to further maintain peak after meal blood sugar (postprandial glucose) level at 100 140 mg/dL. HOME CARE INSTRUCTIONS   Have your hemoglobin A1c level checked twice a year.  Perform daily blood glucose monitoring as directed by your caregiver. It is common to perform frequent blood glucose monitoring.  Monitor urine ketones when you are ill and as directed by your caregiver.  Take your diabetes medicine and insulin as directed by your caregiver to maintain your blood glucose level in the desired range.  Never run out of diabetes medicine or insulin. It is needed every day.  Adjust insulin based on your intake of carbohydrates. Carbohydrates can raise blood glucose levels but need to be included in your diet. Carbohydrates provide vitamins, minerals, and fiber which are an essential part of a healthy diet. Carbohydrates are found in fruits, vegetables, whole grains, dairy products, legumes, and foods containing added sugars.    Eat healthy foods. Alternate 3 meals with 3 snacks.  Maintain a healthy weight gain. The usual total expected weight gain varies according to your prepregnancy body mass index (BMI).  Carry a medical alert card or wear your medical alert jewelry.  Carry a 15 gram carbohydrate snack with you at all times to treat low blood glucose (hypoglycemia). Some examples of 15 gram carbohydrate snacks include:  Glucose tablets, 3 or 4   Glucose gel, 15 gram tube  Raisins, 2 tablespoons (24 g)  Jelly beans, 6  Animal crackers, 8  Fruit juice, regular soda, or low fat milk, 4 ounces (120 mL)  Gummy treats, 9    Recognize hypoglycemia. Hypoglycemia during pregnancy occurs with blood glucose levels of 60 mg/dL and below. The risk for hypoglycemia increases when fasting or skipping meals, during or after intense exercise, and during  sleep. Hypoglycemia symptoms can include:  Tremors or shakes.  Decreased ability to concentrate.  Sweating.  Increased heart rate.  Headache.  Dry mouth.  Hunger.  Irritability.  Anxiety.  Restless sleep.  Altered speech or coordination.  Confusion.  Treat hypoglycemia promptly. If you are alert and able to safely swallow, follow the 15:15 rule:  Take 15 20 grams of rapid-acting glucose or carbohydrate. Rapid-acting options include glucose gel, glucose tablets, or 4 ounces (120 mL) of fruit juice, regular soda, or low fat milk.  Check your blood glucose level 15 minutes after taking the glucose.   Take 15 20 grams more of glucose if the repeat blood glucose level is still 70 mg/dL or below.  Eat a meal or snack within 1 hour once blood glucose levels return to normal.  Be alert to polyuria and polydipsia which are early signs of hyperglycemia. An early awareness of hyperglycemia allows for prompt treatment. Treat hyperglycemia as directed by your caregiver.  Engage in at least 30 minutes of physical activity a day or as directed by your caregiver. Ten minutes of physical activity timed 30 minutes after each meal is encouraged to control postprandial blood glucose levels.  Adjust your insulin dosing and   food intake as needed if you start a new exercise or sport.  Follow your sick day plan at any time you are unable to eat or drink as usual.  Avoid tobacco and alcohol use.  Follow up with your caregiver regularly.  Follow the advice of your caregiver regarding your prenatal and post-delivery (postpartum) appointments, meal planning, exercise, medicines, vitamins, blood tests, other medical tests, and physical activities.  Perform daily skin and foot care. Examine your skin and feet daily for cuts, bruises, redness, nail problems, bleeding, blisters, or sores.  Brush your teeth and gums at least twice a day and floss at least once a day. Follow up with your dentist  regularly.  Schedule an eye exam during the first trimester of your pregnancy or as directed by your caregiver.  Share your diabetes management plan with your workplace or school.  Stay up-to-date with immunizations.  Learn to manage stress.  Obtain ongoing diabetes education and support as needed. SEEK MEDICAL CARE IF:   You are unable to eat food or drink fluids for more than 6 hours.  You have nausea and vomiting for more than 6 hours.  You have a blood glucose level of 200 mg/dL and you have ketones in your urine.  There is a change in mental status.  You develop vision problems.  You have a persistent headache.  You have upper abdominal pain or discomfort.  You develop an additional serious illness.  You have diarrhea for more than 6 hours.  You have been sick or have had a fever for a couple of days and are not getting better. SEEK IMMEDIATE MEDICAL CARE IF:   You have difficulty breathing.  You no longer feel the baby moving.  You are bleeding or have discharge from your vagina.  You start having premature contractions or labor. MAKE SURE YOU:  Understand these instructions.  Will watch your condition.  Will get help right away if you are not doing well or get worse. Document Released: 09/03/2000 Document Revised: 09/22/2012 Document Reviewed: 12/25/2011 [employer] Patient Information 2014 [employer].  Breastfeeding Deciding to breastfeed is one of the best choices you can make for you and your baby. A change in hormones during pregnancy causes your breast tissue to grow and increases the number and size of your milk ducts. These hormones also allow proteins, sugars, and fats from your blood supply to make breast milk in your milk-producing glands. Hormones prevent breast milk from being released before your baby is born as well as prompt milk flow after birth. Once breastfeeding has begun, thoughts of your baby, as well as his or her sucking or crying,  can stimulate the release of milk from your milk-producing glands.  BENEFITS OF BREASTFEEDING For Your Baby  Your first milk (colostrum) helps your baby's digestive system function better.   There are antibodies in your milk that help your baby fight off infections.   Your baby has a lower incidence of asthma, allergies, and sudden infant death syndrome.   The nutrients in breast milk are better for your baby than infant formulas and are designed uniquely for your baby's needs.   Breast milk improves your baby's brain development.   Your baby is less likely to develop other conditions, such as childhood obesity, asthma, or type 2 diabetes mellitus.  For You   Breastfeeding helps to create a very special bond between you and your baby.   Breastfeeding is convenient. Breast milk is always available at the correct temperature   and costs nothing.   Breastfeeding helps to burn calories and helps you lose the weight gained during pregnancy.   Breastfeeding makes your uterus contract to its prepregnancy size faster and slows bleeding (lochia) after you give birth.   Breastfeeding helps to lower your risk of developing type 2 diabetes mellitus, osteoporosis, and breast or ovarian cancer later in life. SIGNS THAT YOUR BABY IS HUNGRY Early Signs of Hunger  Increased alertness or activity.  Stretching.  Movement of the head from side to side.  Movement of the head and opening of the mouth when the corner of the mouth or cheek is stroked (rooting).  Increased sucking sounds, smacking lips, cooing, sighing, or squeaking.  Hand-to-mouth movements.  Increased sucking of fingers or hands. Late Signs of Hunger  Fussing.  Intermittent crying. Extreme Signs of Hunger Signs of extreme hunger will require calming and consoling before your baby will be able to breastfeed successfully. Do not wait for the following signs of extreme hunger to occur before you initiate breastfeeding:    Restlessness.  A loud, strong cry.   Screaming. BREASTFEEDING BASICS Breastfeeding Initiation  Find a comfortable place to sit or lie down, with your neck and back well supported.  Place a pillow or rolled up blanket under your baby to bring him or her to the level of your breast (if you are seated). Nursing pillows are specially designed to help support your arms and your baby while you breastfeed.  Make sure that your baby's abdomen is facing your abdomen.   Gently massage your breast. With your fingertips, massage from your chest wall toward your nipple in a circular motion. This encourages milk flow. You may need to continue this action during the feeding if your milk flows slowly.  Support your breast with 4 fingers underneath and your thumb above your nipple. Make sure your fingers are well away from your nipple and your baby's mouth.   Stroke your baby's lips gently with your finger or nipple.   When your baby's mouth is open wide enough, quickly bring your baby to your breast, placing your entire nipple and as much of the colored area around your nipple (areola) as possible into your baby's mouth.   More areola should be visible above your baby's upper lip than below the lower lip.   Your baby's tongue should be between his or her lower gum and your breast.   Ensure that your baby's mouth is correctly positioned around your nipple (latched). Your baby's lips should create a seal on your breast and be turned out (everted).  It is common for your baby to suck about 2 3 minutes in order to start the flow of breast milk. Latching Teaching your baby how to latch on to your breast properly is very important. An improper latch can cause nipple pain and decreased milk supply for you and poor weight gain in your baby. Also, if your baby is not latched onto your nipple properly, he or she may swallow some air during feeding. This can make your baby fussy. Burping your baby when  you switch breasts during the feeding can help to get rid of the air. However, teaching your baby to latch on properly is still the best way to prevent fussiness from swallowing air while breastfeeding. Signs that your baby has successfully latched on to your nipple:    Silent tugging or silent sucking, without causing you pain.   Swallowing heard between every 3 4 sucks.      Muscle movement above and in front of his or her ears while sucking.  Signs that your baby has not successfully latched on to nipple:   Sucking sounds or smacking sounds from your baby while breastfeeding.  Nipple pain. If you think your baby has not latched on correctly, slip your finger into the corner of your baby's mouth to break the suction and place it between your baby's gums. Attempt breastfeeding initiation again. Signs of Successful Breastfeeding Signs from your baby:   A gradual decrease in the number of sucks or complete cessation of sucking.   Falling asleep.   Relaxation of his or her body.   Retention of a small amount of milk in his or her mouth.   Letting go of your breast by himself or herself. Signs from you:  Breasts that have increased in firmness, weight, and size 1 3 hours after feeding.   Breasts that are softer immediately after breastfeeding.  Increased milk volume, as well as a change in milk consistency and color by the 5th day of breastfeeding.   Nipples that are not sore, cracked, or bleeding. Signs That Your Baby is Getting Enough Milk  Wetting at least 3 diapers in a 24-hour period. The urine should be clear and pale yellow by age 5 days.  At least 3 stools in a 24-hour period by age 5 days. The stool should be soft and yellow.  At least 3 stools in a 24-hour period by age 7 days. The stool should be seedy and yellow.  No loss of weight greater than 10% of birth weight during the first 3 days of age.  Average weight gain of 4 7 ounces (120 210 mL) per week  after age 4 days.  Consistent daily weight gain by age 5 days, without weight loss after the age of 2 weeks. After a feeding, your baby may spit up a small amount. This is common. BREASTFEEDING FREQUENCY AND DURATION Frequent feeding will help you make more milk and can prevent sore nipples and breast engorgement. Breastfeed when you feel the need to reduce the fullness of your breasts or when your baby shows signs of hunger. This is called "breastfeeding on demand." Avoid introducing a pacifier to your baby while you are working to establish breastfeeding (the first 4 6 weeks after your baby is born). After this time you may choose to use a pacifier. Research has shown that pacifier use during the first year of a baby's life decreases the risk of sudden infant death syndrome (SIDS). Allow your baby to feed on each breast as long as he or she wants. Breastfeed until your baby is finished feeding. When your baby unlatches or falls asleep while feeding from the first breast, offer the second breast. Because newborns are often sleepy in the first few weeks of life, you may need to awaken your baby to get him or her to feed. Breastfeeding times will vary from baby to baby. However, the following rules can serve as a guide to help you ensure that your baby is properly fed:  Newborns (babies 4 weeks of age or younger) may breastfeed every 1 3 hours.  Newborns should not go longer than 3 hours during the day or 5 hours during the night without breastfeeding.  You should breastfeed your baby a minimum of 8 times in a 24-hour period until you begin to introduce solid foods to your baby at around 6 months of age. BREAST MILK PUMPING Pumping and storing breast milk   allows you to ensure that your baby is exclusively fed your breast milk, even at times when you are unable to breastfeed. This is especially important if you are going back to work while you are still breastfeeding or when you are not able to be  present during feedings. Your lactation consultant can give you guidelines on how long it is safe to store breast milk.  A breast pump is a machine that allows you to pump milk from your breast into a sterile bottle. The pumped breast milk can then be stored in a refrigerator or freezer. Some breast pumps are operated by hand, while others use electricity. Ask your lactation consultant which type will work best for you. Breast pumps can be purchased, but some hospitals and breastfeeding support groups lease breast pumps on a monthly basis. A lactation consultant can teach you how to hand express breast milk, if you prefer not to use a pump.  CARING FOR YOUR BREASTS WHILE YOU BREASTFEED Nipples can become dry, cracked, and sore while breastfeeding. The following recommendations can help keep your breasts moisturized and healthy:  Avoid using soap on your nipples.   Wear a supportive bra. Although not required, special nursing bras and tank tops are designed to allow access to your breasts for breastfeeding without taking off your entire bra or top. Avoid wearing underwire style bras or extremely tight bras.  Air dry your nipples for 3 4minutes after each feeding.   Use only cotton bra pads to absorb leaked breast milk. Leaking of breast milk between feedings is normal.   Use lanolin on your nipples after breastfeeding. Lanolin helps to maintain your skin's normal moisture barrier. If you use pure lanolin you do not need to wash it off before feeding your baby again. Pure lanolin is not toxic to your baby. You may also hand express a few drops of breast milk and gently massage that milk into your nipples and allow the milk to air dry. In the first few weeks after giving birth, some women experience extremely full breasts (engorgement). Engorgement can make your breasts feel heavy, warm, and tender to the touch. Engorgement peaks within 3 5 days after you give birth. The following recommendations can  help ease engorgement:  Completely empty your breasts while breastfeeding or pumping. You may want to start by applying warm, moist heat (in the shower or with warm water-soaked hand towels) just before feeding or pumping. This increases circulation and helps the milk flow. If your baby does not completely empty your breasts while breastfeeding, pump any extra milk after he or she is finished.  Wear a snug bra (nursing or regular) or tank top for 1 2 days to signal your body to slightly decrease milk production.  Apply ice packs to your breasts, unless this is too uncomfortable for you.  Make sure that your baby is latched on and positioned properly while breastfeeding. If engorgement persists after 48 hours of following these recommendations, contact your health care provider or a lactation consultant. OVERALL HEALTH CARE RECOMMENDATIONS WHILE BREASTFEEDING  Eat healthy foods. Alternate between meals and snacks, eating 3 of each per day. Because what you eat affects your breast milk, some of the foods may make your baby more irritable than usual. Avoid eating these foods if you are sure that they are negatively affecting your baby.  Drink milk, fruit juice, and water to satisfy your thirst (about 10 glasses a day).   Rest often, relax, and continue to take your   prenatal vitamins to prevent fatigue, stress, and anemia.  Continue breast self-awareness checks.  Avoid chewing and smoking tobacco.  Avoid alcohol and drug use. Some medicines that may be harmful to your baby can pass through breast milk. It is important to ask your health care provider before taking any medicine, including all over-the-counter and prescription medicine as well as vitamin and herbal supplements. It is possible to become pregnant while breastfeeding. If birth control is desired, ask your health care provider about options that will be safe for your baby. SEEK MEDICAL CARE IF:   You feel like you want to stop  breastfeeding or have become frustrated with breastfeeding.  You have painful breasts or nipples.  Your nipples are cracked or bleeding.  Your breasts are red, tender, or warm.  You have a swollen area on either breast.  You have a fever or chills.  You have nausea or vomiting.  You have drainage other than breast milk from your nipples.  Your breasts do not become full before feedings by the 5th day after you give birth.  You feel sad and depressed.  Your baby is too sleepy to eat well.  Your baby is having trouble sleeping.   Your baby is wetting less than 3 diapers in a 24-hour period.  Your baby has less than 3 stools in a 24-hour period.  Your baby's skin or the white part of his or her eyes becomes yellow.   Your baby is not gaining weight by 5 days of age. SEEK IMMEDIATE MEDICAL CARE IF:   Your baby is overly tired (lethargic) and does not want to wake up and feed.  Your baby develops an unexplained fever. Document Released: 05/28/2005 Document Revised: 01/28/2013 Document Reviewed: 11/19/2012 [employer] Patient Information 2014 [employer].  

## 2013-11-06 NOTE — Progress Notes (Signed)
She reports taking her meds daily.  Not checking BS as directed. No book Meter shows BS 95-169--1 in last 2 wks. 7 day average 96 Quad screen normal Fetal ECHO scheduled F/u u/s for growth in 2 wks. Eye exam on 6/20 Discussion with pt. Had regarding need for tight glycemic control, changing insulin requirements of pregnancy, risks of poor control-LGA and IUFD.  Pt. Will try harder to check Bs and bring in log.  Needs refill of Glucophage and glucometer strips.

## 2013-12-04 ENCOUNTER — Encounter: Payer: Self-pay | Admitting: Obstetrics & Gynecology

## 2013-12-04 ENCOUNTER — Ambulatory Visit (INDEPENDENT_AMBULATORY_CARE_PROVIDER_SITE_OTHER): Payer: No Typology Code available for payment source | Admitting: Obstetrics & Gynecology

## 2013-12-04 VITALS — BP 123/99 | HR 107 | Wt 236.0 lb

## 2013-12-04 DIAGNOSIS — Z23 Encounter for immunization: Secondary | ICD-10-CM

## 2013-12-04 DIAGNOSIS — O0990 Supervision of high risk pregnancy, unspecified, unspecified trimester: Secondary | ICD-10-CM

## 2013-12-04 LAB — CBC
HCT: 31.7 % — ABNORMAL LOW (ref 36.0–46.0)
HEMOGLOBIN: 10.9 g/dL — AB (ref 12.0–15.0)
MCH: 27.7 pg (ref 26.0–34.0)
MCHC: 34.4 g/dL (ref 30.0–36.0)
MCV: 80.5 fL (ref 78.0–100.0)
PLATELETS: 216 10*3/uL (ref 150–400)
RBC: 3.94 MIL/uL (ref 3.87–5.11)
RDW: 14.6 % (ref 11.5–15.5)
WBC: 8.3 10*3/uL (ref 4.0–10.5)

## 2013-12-04 NOTE — Progress Notes (Signed)
Routine visit. Good FM. I will schedule anatomy f/u. She has fetal echo today. TDAP and labs today. Her fbs are in the 60s. I rec that she decrease her hs NPH from 50 to 46. Her other sugars are all less than 120.

## 2013-12-05 LAB — SYPHILIS: RPR W/REFLEX TO RPR TITER AND TREPONEMAL ANTIBODIES, TRADITIONAL SCREENING AND DIAGNOSIS ALGORITHM

## 2013-12-05 LAB — HIV ANTIBODY (ROUTINE TESTING W REFLEX): HIV 1&2 Ab, 4th Generation: NONREACTIVE

## 2013-12-09 ENCOUNTER — Encounter: Payer: No Typology Code available for payment source | Admitting: Obstetrics & Gynecology

## 2013-12-31 ENCOUNTER — Ambulatory Visit (INDEPENDENT_AMBULATORY_CARE_PROVIDER_SITE_OTHER): Payer: No Typology Code available for payment source | Admitting: Obstetrics & Gynecology

## 2013-12-31 ENCOUNTER — Encounter: Payer: Self-pay | Admitting: Obstetrics & Gynecology

## 2013-12-31 VITALS — BP 118/87 | HR 109 | Wt 235.0 lb

## 2013-12-31 DIAGNOSIS — E1165 Type 2 diabetes mellitus with hyperglycemia: Principal | ICD-10-CM

## 2013-12-31 DIAGNOSIS — O24311 Unspecified pre-existing diabetes mellitus in pregnancy, first trimester: Secondary | ICD-10-CM

## 2013-12-31 DIAGNOSIS — Z3480 Encounter for supervision of other normal pregnancy, unspecified trimester: Secondary | ICD-10-CM

## 2013-12-31 DIAGNOSIS — E119 Type 2 diabetes mellitus without complications: Secondary | ICD-10-CM

## 2013-12-31 DIAGNOSIS — IMO0001 Reserved for inherently not codable concepts without codable children: Secondary | ICD-10-CM

## 2013-12-31 DIAGNOSIS — O24919 Unspecified diabetes mellitus in pregnancy, unspecified trimester: Secondary | ICD-10-CM

## 2013-12-31 NOTE — Progress Notes (Signed)
Routine visit. She has been unable to check her sugars since the week of the 4th of July due to not being able to afford her test strips until now when her medicaid started. She has felt normal. She reports "a lot" of fetal movement. She denies VB or ROM or painful contractions (She has BH contractions). A follow up u/s is next Wednesday. Check rbs today.

## 2013-12-31 NOTE — Progress Notes (Signed)
CBG is 92 today.

## 2014-01-05 ENCOUNTER — Ambulatory Visit (HOSPITAL_COMMUNITY): Payer: No Typology Code available for payment source

## 2014-01-06 ENCOUNTER — Other Ambulatory Visit: Payer: Self-pay | Admitting: Obstetrics & Gynecology

## 2014-01-06 ENCOUNTER — Ambulatory Visit (HOSPITAL_COMMUNITY)
Admission: RE | Admit: 2014-01-06 | Discharge: 2014-01-06 | Disposition: A | Discharge status: Home / Self Care | Payer: No Typology Code available for payment source | Source: Ambulatory Visit | Attending: Obstetrics & Gynecology | Admitting: Obstetrics & Gynecology

## 2014-01-06 DIAGNOSIS — Z3689 Encounter for other specified antenatal screening: Secondary | ICD-10-CM | POA: Diagnosis not present

## 2014-01-06 DIAGNOSIS — E119 Type 2 diabetes mellitus without complications: Secondary | ICD-10-CM | POA: Diagnosis not present

## 2014-01-06 DIAGNOSIS — O24919 Unspecified diabetes mellitus in pregnancy, unspecified trimester: Secondary | ICD-10-CM | POA: Diagnosis present

## 2014-01-06 DIAGNOSIS — O0990 Supervision of high risk pregnancy, unspecified, unspecified trimester: Secondary | ICD-10-CM

## 2014-01-07 ENCOUNTER — Ambulatory Visit (INDEPENDENT_AMBULATORY_CARE_PROVIDER_SITE_OTHER): Payer: No Typology Code available for payment source | Admitting: Family Medicine

## 2014-01-07 VITALS — BP 119/86 | HR 112 | Wt 237.0 lb

## 2014-01-07 DIAGNOSIS — O24313 Unspecified pre-existing diabetes mellitus in pregnancy, third trimester: Secondary | ICD-10-CM

## 2014-01-07 DIAGNOSIS — O24919 Unspecified diabetes mellitus in pregnancy, unspecified trimester: Secondary | ICD-10-CM

## 2014-01-07 DIAGNOSIS — E119 Type 2 diabetes mellitus without complications: Secondary | ICD-10-CM

## 2014-01-07 DIAGNOSIS — O0993 Supervision of high risk pregnancy, unspecified, third trimester: Secondary | ICD-10-CM

## 2014-01-07 DIAGNOSIS — Z3480 Encounter for supervision of other normal pregnancy, unspecified trimester: Secondary | ICD-10-CM

## 2014-01-07 NOTE — Patient Instructions (Signed)
Third Trimester of Pregnancy The third trimester is from week 29 through week 42, months 7 through 9. The third trimester is a time when the fetus is growing rapidly. At the end of the ninth month, the fetus is about 20 inches in length and weighs 6-10 pounds.  BODY CHANGES Your body goes through many changes during pregnancy. The changes vary from woman to woman.   Your weight will continue to increase. You can expect to gain 25-35 pounds (11-16 kg) by the end of the pregnancy.  You may begin to get stretch marks on your hips, abdomen, and breasts.  You may urinate more often because the fetus is moving lower into your pelvis and pressing on your bladder.  You may develop or continue to have heartburn as a result of your pregnancy.  You may develop constipation because certain hormones are causing the muscles that push waste through your intestines to slow down.  You may develop hemorrhoids or swollen, bulging veins (varicose veins).  You may have pelvic pain because of the weight gain and pregnancy hormones relaxing your joints between the bones in your pelvis. Backaches may result from overexertion of the muscles supporting your posture.  You may have changes in your hair. These can include thickening of your hair, rapid growth, and changes in texture. Some women also have hair loss during or after pregnancy, or hair that feels dry or thin. Your hair will most likely return to normal after your baby is born.  Your breasts will continue to grow and be tender. A yellow discharge may leak from your breasts called colostrum.  Your belly button may stick out.  You may feel short of breath because of your expanding uterus.  You may notice the fetus "dropping," or moving lower in your abdomen.  You may have a bloody mucus discharge. This usually occurs a few days to a week before labor begins.  Your cervix becomes thin and soft (effaced) near your due date. WHAT TO EXPECT AT YOUR  PRENATAL EXAMS  You will have prenatal exams every 2 weeks until week 36. Then, you will have weekly prenatal exams. During a routine prenatal visit:  You will be weighed to make sure you and the fetus are growing normally.  Your blood pressure is taken.  Your abdomen will be measured to track your baby's growth.  The fetal heartbeat will be listened to.  Any test results from the previous visit will be discussed.  You may have a cervical check near your due date to see if you have effaced. At around 36 weeks, your caregiver will check your cervix. At the same time, your caregiver will also perform a test on the secretions of the vaginal tissue. This test is to determine if a type of bacteria, Group B streptococcus, is present. Your caregiver will explain this further. Your caregiver may ask you:  What your birth plan is.  How you are feeling.  If you are feeling the baby move.  If you have had any abnormal symptoms, such as leaking fluid, bleeding, severe headaches, or abdominal cramping.  If you have any questions. Other tests or screenings that may be performed during your third trimester include:  Blood tests that check for low iron levels (anemia).  Fetal testing to check the health, activity level, and growth of the fetus. Testing is done if you have certain medical conditions or if there are problems during the pregnancy. FALSE LABOR You may feel small, irregular contractions that   eventually go away. These are called Braxton Hicks contractions, or false labor. Contractions may last for hours, days, or even weeks before true labor sets in. If contractions come at regular intervals, intensify, or become painful, it is best to be seen by your caregiver.  SIGNS OF LABOR   Menstrual-like cramps.  Contractions that are 5 minutes apart or less.  Contractions that start on the top of the uterus and spread down to the lower abdomen and back.  A sense of increased pelvic  pressure or back pain.  A watery or bloody mucus discharge that comes from the vagina. If you have any of these signs before the 37th week of pregnancy, call your caregiver right away. You need to go to the hospital to get checked immediately. HOME CARE INSTRUCTIONS   Avoid all smoking, herbs, alcohol, and unprescribed drugs. These chemicals affect the formation and growth of the baby.  Follow your caregiver's instructions regarding medicine use. There are medicines that are either safe or unsafe to take during pregnancy.  Exercise only as directed by your caregiver. Experiencing uterine cramps is a good sign to stop exercising.  Continue to eat regular, healthy meals.  Wear a good support bra for breast tenderness.  Do not use hot tubs, steam rooms, or saunas.  Wear your seat belt at all times when driving.  Avoid raw meat, uncooked cheese, cat litter boxes, and soil used by cats. These carry germs that can cause birth defects in the baby.  Take your prenatal vitamins.  Try taking a stool softener (if your caregiver approves) if you develop constipation. Eat more high-fiber foods, such as fresh vegetables or fruit and whole grains. Drink plenty of fluids to keep your urine clear or pale yellow.  Take warm sitz baths to soothe any pain or discomfort caused by hemorrhoids. Use hemorrhoid cream if your caregiver approves.  If you develop varicose veins, wear support hose. Elevate your feet for 15 minutes, 3-4 times a day. Limit salt in your diet.  Avoid heavy lifting, wear low heal shoes, and practice good posture.  Rest a lot with your legs elevated if you have leg cramps or low back pain.  Visit your dentist if you have not gone during your pregnancy. Use a soft toothbrush to brush your teeth and be gentle when you floss.  A sexual relationship may be continued unless your caregiver directs you otherwise.  Do not travel far distances unless it is absolutely necessary and only  with the approval of your caregiver.  Take prenatal classes to understand, practice, and ask questions about the labor and delivery.  Make a trial run to the hospital.  Pack your hospital bag.  Prepare the baby's nursery.  Continue to go to all your prenatal visits as directed by your caregiver. SEEK MEDICAL CARE IF:  You are unsure if you are in labor or if your water has broken.  You have dizziness.  You have mild pelvic cramps, pelvic pressure, or nagging pain in your abdominal area.  You have persistent nausea, vomiting, or diarrhea.  You have a bad smelling vaginal discharge.  You have pain with urination. SEEK IMMEDIATE MEDICAL CARE IF:   You have a fever.  You are leaking fluid from your vagina.  You have spotting or bleeding from your vagina.  You have severe abdominal cramping or pain.  You have rapid weight loss or gain.  You have shortness of breath with chest pain.  You notice sudden or extreme swelling   of your face, hands, ankles, feet, or legs.  You have not felt your baby move in over an hour.  You have severe headaches that do not go away with medicine.  You have vision changes. Document Released: 05/22/2001 Document Revised: 06/02/2013 Document Reviewed: 07/29/2012 [employer] Patient Information 2015 [employer]. This information is not intended to replace advice given to you by your health care provider. Make sure you discuss any questions you have with your health care provider.  Breastfeeding Deciding to breastfeed is one of the best choices you can make for you and your baby. A change in hormones during pregnancy causes your breast tissue to grow and increases the number and size of your milk ducts. These hormones also allow proteins, sugars, and fats from your blood supply to make breast milk in your milk-producing glands. Hormones prevent breast milk from being released before your baby is born as well as prompt milk flow after birth. Once  breastfeeding has begun, thoughts of your baby, as well as his or her sucking or crying, can stimulate the release of milk from your milk-producing glands.  BENEFITS OF BREASTFEEDING For Your Baby  Your first milk (colostrum) helps your baby's digestive system function better.   There are antibodies in your milk that help your baby fight off infections.   Your baby has a lower incidence of asthma, allergies, and sudden infant death syndrome.   The nutrients in breast milk are better for your baby than infant formulas and are designed uniquely for your baby's needs.   Breast milk improves your baby's brain development.   Your baby is less likely to develop other conditions, such as childhood obesity, asthma, or type 2 diabetes mellitus.  For You   Breastfeeding helps to create a very special bond between you and your baby.   Breastfeeding is convenient. Breast milk is always available at the correct temperature and costs nothing.   Breastfeeding helps to burn calories and helps you lose the weight gained during pregnancy.   Breastfeeding makes your uterus contract to its prepregnancy size faster and slows bleeding (lochia) after you give birth.   Breastfeeding helps to lower your risk of developing type 2 diabetes mellitus, osteoporosis, and breast or ovarian cancer later in life. SIGNS THAT YOUR BABY IS HUNGRY Early Signs of Hunger  Increased alertness or activity.  Stretching.  Movement of the head from side to side.  Movement of the head and opening of the mouth when the corner of the mouth or cheek is stroked (rooting).  Increased sucking sounds, smacking lips, cooing, sighing, or squeaking.  Hand-to-mouth movements.  Increased sucking of fingers or hands. Late Signs of Hunger  Fussing.  Intermittent crying. Extreme Signs of Hunger Signs of extreme hunger will require calming and consoling before your baby will be able to breastfeed successfully. Do not  wait for the following signs of extreme hunger to occur before you initiate breastfeeding:   Restlessness.  A loud, strong cry.   Screaming. BREASTFEEDING BASICS Breastfeeding Initiation  Find a comfortable place to sit or lie down, with your neck and back well supported.  Place a pillow or rolled up blanket under your baby to bring him or her to the level of your breast (if you are seated). Nursing pillows are specially designed to help support your arms and your baby while you breastfeed.  Make sure that your baby's abdomen is facing your abdomen.   Gently massage your breast. With your fingertips, massage from your chest   wall toward your nipple in a circular motion. This encourages milk flow. You may need to continue this action during the feeding if your milk flows slowly.  Support your breast with 4 fingers underneath and your thumb above your nipple. Make sure your fingers are well away from your nipple and your baby's mouth.   Stroke your baby's lips gently with your finger or nipple.   When your baby's mouth is open wide enough, quickly bring your baby to your breast, placing your entire nipple and as much of the colored area around your nipple (areola) as possible into your baby's mouth.   More areola should be visible above your baby's upper lip than below the lower lip.   Your baby's tongue should be between his or her lower gum and your breast.   Ensure that your baby's mouth is correctly positioned around your nipple (latched). Your baby's lips should create a seal on your breast and be turned out (everted).  It is common for your baby to suck about 2-3 minutes in order to start the flow of breast milk. Latching Teaching your baby how to latch on to your breast properly is very important. An improper latch can cause nipple pain and decreased milk supply for you and poor weight gain in your baby. Also, if your baby is not latched onto your nipple properly, he or she  may swallow some air during feeding. This can make your baby fussy. Burping your baby when you switch breasts during the feeding can help to get rid of the air. However, teaching your baby to latch on properly is still the best way to prevent fussiness from swallowing air while breastfeeding. Signs that your baby has successfully latched on to your nipple:    Silent tugging or silent sucking, without causing you pain.   Swallowing heard between every 3-4 sucks.    Muscle movement above and in front of his or her ears while sucking.  Signs that your baby has not successfully latched on to nipple:   Sucking sounds or smacking sounds from your baby while breastfeeding.  Nipple pain. If you think your baby has not latched on correctly, slip your finger into the corner of your baby's mouth to break the suction and place it between your baby's gums. Attempt breastfeeding initiation again. Signs of Successful Breastfeeding Signs from your baby:   A gradual decrease in the number of sucks or complete cessation of sucking.   Falling asleep.   Relaxation of his or her body.   Retention of a small amount of milk in his or her mouth.   Letting go of your breast by himself or herself. Signs from you:  Breasts that have increased in firmness, weight, and size 1-3 hours after feeding.   Breasts that are softer immediately after breastfeeding.  Increased milk volume, as well as a change in milk consistency and color by the fifth day of breastfeeding.   Nipples that are not sore, cracked, or bleeding. Signs That Your Baby is Getting Enough Milk  Wetting at least 3 diapers in a 24-hour period. The urine should be clear and pale yellow by age 5 days.  At least 3 stools in a 24-hour period by age 5 days. The stool should be soft and yellow.  At least 3 stools in a 24-hour period by age 7 days. The stool should be seedy and yellow.  No loss of weight greater than 10% of birth weight  during the first 3   days of age.  Average weight gain of 4-7 ounces (113-198 g) per week after age 4 days.  Consistent daily weight gain by age 5 days, without weight loss after the age of 2 weeks. After a feeding, your baby may spit up a small amount. This is common. BREASTFEEDING FREQUENCY AND DURATION Frequent feeding will help you make more milk and can prevent sore nipples and breast engorgement. Breastfeed when you feel the need to reduce the fullness of your breasts or when your baby shows signs of hunger. This is called "breastfeeding on demand." Avoid introducing a pacifier to your baby while you are working to establish breastfeeding (the first 4-6 weeks after your baby is born). After this time you may choose to use a pacifier. Research has shown that pacifier use during the first year of a baby's life decreases the risk of sudden infant death syndrome (SIDS). Allow your baby to feed on each breast as long as he or she wants. Breastfeed until your baby is finished feeding. When your baby unlatches or falls asleep while feeding from the first breast, offer the second breast. Because newborns are often sleepy in the first few weeks of life, you may need to awaken your baby to get him or her to feed. Breastfeeding times will vary from baby to baby. However, the following rules can serve as a guide to help you ensure that your baby is properly fed:  Newborns (babies 4 weeks of age or younger) may breastfeed every 1-3 hours.  Newborns should not go longer than 3 hours during the day or 5 hours during the night without breastfeeding.  You should breastfeed your baby a minimum of 8 times in a 24-hour period until you begin to introduce solid foods to your baby at around 6 months of age. BREAST MILK PUMPING Pumping and storing breast milk allows you to ensure that your baby is exclusively fed your breast milk, even at times when you are unable to breastfeed. This is especially important if you are  going back to work while you are still breastfeeding or when you are not able to be present during feedings. Your lactation consultant can give you guidelines on how long it is safe to store breast milk.  A breast pump is a machine that allows you to pump milk from your breast into a sterile bottle. The pumped breast milk can then be stored in a refrigerator or freezer. Some breast pumps are operated by hand, while others use electricity. Ask your lactation consultant which type will work best for you. Breast pumps can be purchased, but some hospitals and breastfeeding support groups lease breast pumps on a monthly basis. A lactation consultant can teach you how to hand express breast milk, if you prefer not to use a pump.  CARING FOR YOUR BREASTS WHILE YOU BREASTFEED Nipples can become dry, cracked, and sore while breastfeeding. The following recommendations can help keep your breasts moisturized and healthy:  Avoid using soap on your nipples.   Wear a supportive bra. Although not required, special nursing bras and tank tops are designed to allow access to your breasts for breastfeeding without taking off your entire bra or top. Avoid wearing underwire-style bras or extremely tight bras.  Air dry your nipples for 3-4minutes after each feeding.   Use only cotton bra pads to absorb leaked breast milk. Leaking of breast milk between feedings is normal.   Use lanolin on your nipples after breastfeeding. Lanolin helps to maintain your skin's   normal moisture barrier. If you use pure lanolin, you do not need to wash it off before feeding your baby again. Pure lanolin is not toxic to your baby. You may also hand express a few drops of breast milk and gently massage that milk into your nipples and allow the milk to air dry. In the first few weeks after giving birth, some women experience extremely full breasts (engorgement). Engorgement can make your breasts feel heavy, warm, and tender to the touch.  Engorgement peaks within 3-5 days after you give birth. The following recommendations can help ease engorgement:  Completely empty your breasts while breastfeeding or pumping. You may want to start by applying warm, moist heat (in the shower or with warm water-soaked hand towels) just before feeding or pumping. This increases circulation and helps the milk flow. If your baby does not completely empty your breasts while breastfeeding, pump any extra milk after he or she is finished.  Wear a snug bra (nursing or regular) or tank top for 1-2 days to signal your body to slightly decrease milk production.  Apply ice packs to your breasts, unless this is too uncomfortable for you.  Make sure that your baby is latched on and positioned properly while breastfeeding. If engorgement persists after 48 hours of following these recommendations, contact your health care provider or a lactation consultant. OVERALL HEALTH CARE RECOMMENDATIONS WHILE BREASTFEEDING  Eat healthy foods. Alternate between meals and snacks, eating 3 of each per day. Because what you eat affects your breast milk, some of the foods may make your baby more irritable than usual. Avoid eating these foods if you are sure that they are negatively affecting your baby.  Drink milk, fruit juice, and water to satisfy your thirst (about 10 glasses a day).   Rest often, relax, and continue to take your prenatal vitamins to prevent fatigue, stress, and anemia.  Continue breast self-awareness checks.  Avoid chewing and smoking tobacco.  Avoid alcohol and drug use. Some medicines that may be harmful to your baby can pass through breast milk. It is important to ask your health care provider before taking any medicine, including all over-the-counter and prescription medicine as well as vitamin and herbal supplements. It is possible to become pregnant while breastfeeding. If birth control is desired, ask your health care provider about options that  will be safe for your baby. SEEK MEDICAL CARE IF:   You feel like you want to stop breastfeeding or have become frustrated with breastfeeding.  You have painful breasts or nipples.  Your nipples are cracked or bleeding.  Your breasts are red, tender, or warm.  You have a swollen area on either breast.  You have a fever or chills.  You have nausea or vomiting.  You have drainage other than breast milk from your nipples.  Your breasts do not become full before feedings by the fifth day after you give birth.  You feel sad and depressed.  Your baby is too sleepy to eat well.  Your baby is having trouble sleeping.   Your baby is wetting less than 3 diapers in a 24-hour period.  Your baby has less than 3 stools in a 24-hour period.  Your baby's skin or the white part of his or her eyes becomes yellow.   Your baby is not gaining weight by 5 days of age. SEEK IMMEDIATE MEDICAL CARE IF:   Your baby is overly tired (lethargic) and does not want to wake up and feed.  Your baby   develops an unexplained fever. Document Released: 05/28/2005 Document Revised: 06/02/2013 Document Reviewed: 11/19/2012 [employer] Patient Information 2015 [employer]. This information is not intended to replace advice given to you by your health care provider. Make sure you discuss any questions you have with your health care provider.  

## 2014-01-07 NOTE — Progress Notes (Signed)
Most BS are good.  She reports decreasing her insulin to 46u.  FBS are in the 80-90's.  Occasional elevation of BS related to not taking her Metformin. BPP 8/8 yesterday Needs to begin 2x/wk testing U/S shows fetus at 51% with normal fluid, vtx. NST reviewed and reactive.

## 2014-01-11 ENCOUNTER — Encounter: Payer: No Typology Code available for payment source | Admitting: Family Medicine

## 2014-01-14 ENCOUNTER — Encounter: Payer: Self-pay | Admitting: *Deleted

## 2014-01-14 ENCOUNTER — Ambulatory Visit (INDEPENDENT_AMBULATORY_CARE_PROVIDER_SITE_OTHER): Payer: No Typology Code available for payment source | Admitting: *Deleted

## 2014-01-14 DIAGNOSIS — E119 Type 2 diabetes mellitus without complications: Secondary | ICD-10-CM

## 2014-01-14 DIAGNOSIS — O24113 Pre-existing diabetes mellitus, type 2, in pregnancy, third trimester: Secondary | ICD-10-CM

## 2014-01-14 DIAGNOSIS — O24919 Unspecified diabetes mellitus in pregnancy, unspecified trimester: Secondary | ICD-10-CM

## 2014-01-14 NOTE — Progress Notes (Signed)
Patient is here today for NST and she missed her BPP earlier in the week so we did that today as well.  NST is reactive and BPP is 20.4.  She will follow up next week for her ob check and twice weekly testing.  I have advised her of the importance of keeping these appointments due to her being considered high risk with having diabetes.

## 2014-01-18 ENCOUNTER — Encounter: Payer: No Typology Code available for payment source | Admitting: Obstetrics & Gynecology

## 2014-01-19 ENCOUNTER — Ambulatory Visit (INDEPENDENT_AMBULATORY_CARE_PROVIDER_SITE_OTHER): Payer: No Typology Code available for payment source | Admitting: Family Medicine

## 2014-01-19 ENCOUNTER — Encounter: Payer: Self-pay | Admitting: Family Medicine

## 2014-01-19 VITALS — BP 127/96 | HR 97 | Wt 242.8 lb

## 2014-01-19 DIAGNOSIS — O24919 Unspecified diabetes mellitus in pregnancy, unspecified trimester: Secondary | ICD-10-CM

## 2014-01-19 DIAGNOSIS — E119 Type 2 diabetes mellitus without complications: Secondary | ICD-10-CM

## 2014-01-19 DIAGNOSIS — O24313 Unspecified pre-existing diabetes mellitus in pregnancy, third trimester: Secondary | ICD-10-CM

## 2014-01-19 DIAGNOSIS — O139 Gestational [pregnancy-induced] hypertension without significant proteinuria, unspecified trimester: Secondary | ICD-10-CM

## 2014-01-19 DIAGNOSIS — Z3400 Encounter for supervision of normal first pregnancy, unspecified trimester: Secondary | ICD-10-CM

## 2014-01-19 DIAGNOSIS — O163 Unspecified maternal hypertension, third trimester: Secondary | ICD-10-CM

## 2014-01-19 NOTE — Patient Instructions (Signed)
Preeclampsia and Eclampsia Preeclampsia is a serious condition that develops only during pregnancy. It is also called toxemia of pregnancy. This condition causes high blood pressure along with other symptoms, such as swelling and headaches. These may develop as the condition gets worse. Preeclampsia may occur 20 weeks or later into your pregnancy.  Diagnosing and treating preeclampsia early is very important. If not treated early, it can cause serious problems for you and your baby. One problem it can lead to is eclampsia, which is a condition that causes muscle jerking or shaking (convulsions) in the mother. Delivering your baby is the best treatment for preeclampsia or eclampsia.  RISK FACTORS The cause of preeclampsia is not known. You may be more likely to develop preeclampsia if you have certain risk factors. These include:   Being pregnant for the first time.  Having preeclampsia in a past pregnancy.  Having a family history of preeclampsia.  Having high blood pressure.  Being pregnant with twins or triplets.  Being 35 or older.  Being African American.  Having kidney disease or diabetes.  Having medical conditions such as lupus or blood diseases.  Being very overweight (obese). SIGNS AND SYMPTOMS  The earliest signs of preeclampsia are:  High blood pressure.  Increased protein in your urine. Your health care provider will check for this at every prenatal visit. Other symptoms that can develop include:   Severe headaches.  Sudden weight gain.  Swelling of your hands, face, legs, and feet.  Feeling sick to your stomach (nauseous) and throwing up (vomiting).  Vision problems (blurred or double vision).  Numbness in your face, arms, legs, and feet.  Dizziness.  Slurred speech.  Sensitivity to bright lights.  Abdominal pain. DIAGNOSIS  There are no screening tests for preeclampsia. Your health care provider will ask you about symptoms and check for signs of  preeclampsia during your prenatal visits. You may also have tests, including:  Urine testing.  Blood testing.  Checking your baby's heart rate.  Checking the health of your baby and your placenta using images created with sound waves (ultrasound). TREATMENT  You can work out the best treatment approach together with your health care provider. It is very important to keep all prenatal appointments. If you have an increased risk of preeclampsia, you may need more frequent prenatal exams.  Your health care provider may prescribe bed rest.  You may have to eat as little salt as possible.  You may need to take medicine to lower your blood pressure if the condition does not respond to more conservative measures.  You may need to stay in the hospital if your condition is severe. There, treatment will focus on controlling your blood pressure and fluid retention. You may also need to take medicine to prevent seizures.  If the condition gets worse, your baby may need to be delivered early to protect you and the baby. You may have your labor started with medicine (be induced), or you may have a cesarean delivery.  Preeclampsia usually goes away after the baby is born. HOME CARE INSTRUCTIONS   Only take over-the-counter or prescription medicines as directed by your health care provider.  Lie on your left side while resting. This keeps pressure off your baby.  Elevate your feet while resting.  Get regular exercise. Ask your health care provider what type of exercise is safe for you.  Avoid caffeine and alcohol.  Do not smoke.  Drink 6-8 glasses of water every day.  Eat a balanced diet   that is low in salt. Do not add salt to your food.  Avoid stressful situations as much as possible.  Get plenty of rest and sleep.  Keep all prenatal appointments and tests as scheduled. SEEK MEDICAL CARE IF:  You are gaining more weight than expected.  You have any headaches, abdominal pain, or  nausea.  You are bruising more than usual.  You feel dizzy or light-headed. SEEK IMMEDIATE MEDICAL CARE IF:   You develop sudden or severe swelling anywhere in your body. This usually happens in the legs.  You gain 5 lb (2.3 kg) or more in a week.  You have a severe headache, dizziness, problems with your vision, or confusion.  You have severe abdominal pain.  You have lasting nausea or vomiting.  You have a seizure.  You have trouble moving any part of your body.  You develop numbness in your body.  You have trouble speaking.  You have any abnormal bleeding.  You develop a stiff neck.  You pass out. MAKE SURE YOU:   Understand these instructions.  Will watch your condition.  Will get help right away if you are not doing well or get worse. Document Released: 05/25/2000 Document Revised: 06/02/2013 Document Reviewed: 03/20/2013 [employer] Patient Information 2015 [employer]. This information is not intended to replace advice given to you by your health care provider. Make sure you discuss any questions you have with your health care provider.  Breastfeeding Deciding to breastfeed is one of the best choices you can make for you and your baby. A change in hormones during pregnancy causes your breast tissue to grow and increases the number and size of your milk ducts. These hormones also allow proteins, sugars, and fats from your blood supply to make breast milk in your milk-producing glands. Hormones prevent breast milk from being released before your baby is born as well as prompt milk flow after birth. Once breastfeeding has begun, thoughts of your baby, as well as his or her sucking or crying, can stimulate the release of milk from your milk-producing glands.  BENEFITS OF BREASTFEEDING For Your Baby  Your first milk (colostrum) helps your baby's digestive system function better.   There are antibodies in your milk that help your baby fight off infections.   Your  baby has a lower incidence of asthma, allergies, and sudden infant death syndrome.   The nutrients in breast milk are better for your baby than infant formulas and are designed uniquely for your baby's needs.   Breast milk improves your baby's brain development.   Your baby is less likely to develop other conditions, such as childhood obesity, asthma, or type 2 diabetes mellitus.  For You   Breastfeeding helps to create a very special bond between you and your baby.   Breastfeeding is convenient. Breast milk is always available at the correct temperature and costs nothing.   Breastfeeding helps to burn calories and helps you lose the weight gained during pregnancy.   Breastfeeding makes your uterus contract to its prepregnancy size faster and slows bleeding (lochia) after you give birth.   Breastfeeding helps to lower your risk of developing type 2 diabetes mellitus, osteoporosis, and breast or ovarian cancer later in life. SIGNS THAT YOUR BABY IS HUNGRY Early Signs of Hunger  Increased alertness or activity.  Stretching.  Movement of the head from side to side.  Movement of the head and opening of the mouth when the corner of the mouth or cheek is stroked (rooting).    Increased sucking sounds, smacking lips, cooing, sighing, or squeaking.  Hand-to-mouth movements.  Increased sucking of fingers or hands. Late Signs of Hunger  Fussing.  Intermittent crying. Extreme Signs of Hunger Signs of extreme hunger will require calming and consoling before your baby will be able to breastfeed successfully. Do not wait for the following signs of extreme hunger to occur before you initiate breastfeeding:   Restlessness.  A loud, strong cry.   Screaming. BREASTFEEDING BASICS Breastfeeding Initiation  Find a comfortable place to sit or lie down, with your neck and back well supported.  Place a pillow or rolled up blanket under your baby to bring him or her to the level of  your breast (if you are seated). Nursing pillows are specially designed to help support your arms and your baby while you breastfeed.  Make sure that your baby's abdomen is facing your abdomen.   Gently massage your breast. With your fingertips, massage from your chest wall toward your nipple in a circular motion. This encourages milk flow. You may need to continue this action during the feeding if your milk flows slowly.  Support your breast with 4 fingers underneath and your thumb above your nipple. Make sure your fingers are well away from your nipple and your baby's mouth.   Stroke your baby's lips gently with your finger or nipple.   When your baby's mouth is open wide enough, quickly bring your baby to your breast, placing your entire nipple and as much of the colored area around your nipple (areola) as possible into your baby's mouth.   More areola should be visible above your baby's upper lip than below the lower lip.   Your baby's tongue should be between his or her lower gum and your breast.   Ensure that your baby's mouth is correctly positioned around your nipple (latched). Your baby's lips should create a seal on your breast and be turned out (everted).  It is common for your baby to suck about 2-3 minutes in order to start the flow of breast milk. Latching Teaching your baby how to latch on to your breast properly is very important. An improper latch can cause nipple pain and decreased milk supply for you and poor weight gain in your baby. Also, if your baby is not latched onto your nipple properly, he or she may swallow some air during feeding. This can make your baby fussy. Burping your baby when you switch breasts during the feeding can help to get rid of the air. However, teaching your baby to latch on properly is still the best way to prevent fussiness from swallowing air while breastfeeding. Signs that your baby has successfully latched on to your nipple:    Silent  tugging or silent sucking, without causing you pain.   Swallowing heard between every 3-4 sucks.    Muscle movement above and in front of his or her ears while sucking.  Signs that your baby has not successfully latched on to nipple:   Sucking sounds or smacking sounds from your baby while breastfeeding.  Nipple pain. If you think your baby has not latched on correctly, slip your finger into the corner of your baby's mouth to break the suction and place it between your baby's gums. Attempt breastfeeding initiation again. Signs of Successful Breastfeeding Signs from your baby:   A gradual decrease in the number of sucks or complete cessation of sucking.   Falling asleep.   Relaxation of his or her body.     Retention of a small amount of milk in his or her mouth.   Letting go of your breast by himself or herself. Signs from you:  Breasts that have increased in firmness, weight, and size 1-3 hours after feeding.   Breasts that are softer immediately after breastfeeding.  Increased milk volume, as well as a change in milk consistency and color by the fifth day of breastfeeding.   Nipples that are not sore, cracked, or bleeding. Signs That Your Baby is Getting Enough Milk  Wetting at least 3 diapers in a 24-hour period. The urine should be clear and pale yellow by age 5 days.  At least 3 stools in a 24-hour period by age 5 days. The stool should be soft and yellow.  At least 3 stools in a 24-hour period by age 7 days. The stool should be seedy and yellow.  No loss of weight greater than 10% of birth weight during the first 3 days of age.  Average weight gain of 4-7 ounces (113-198 g) per week after age 4 days.  Consistent daily weight gain by age 5 days, without weight loss after the age of 2 weeks. After a feeding, your baby may spit up a small amount. This is common. BREASTFEEDING FREQUENCY AND DURATION Frequent feeding will help you make more milk and can prevent  sore nipples and breast engorgement. Breastfeed when you feel the need to reduce the fullness of your breasts or when your baby shows signs of hunger. This is called "breastfeeding on demand." Avoid introducing a pacifier to your baby while you are working to establish breastfeeding (the first 4-6 weeks after your baby is born). After this time you may choose to use a pacifier. Research has shown that pacifier use during the first year of a baby's life decreases the risk of sudden infant death syndrome (SIDS). Allow your baby to feed on each breast as long as he or she wants. Breastfeed until your baby is finished feeding. When your baby unlatches or falls asleep while feeding from the first breast, offer the second breast. Because newborns are often sleepy in the first few weeks of life, you may need to awaken your baby to get him or her to feed. Breastfeeding times will vary from baby to baby. However, the following rules can serve as a guide to help you ensure that your baby is properly fed:  Newborns (babies 4 weeks of age or younger) may breastfeed every 1-3 hours.  Newborns should not go longer than 3 hours during the day or 5 hours during the night without breastfeeding.  You should breastfeed your baby a minimum of 8 times in a 24-hour period until you begin to introduce solid foods to your baby at around 6 months of age. BREAST MILK PUMPING Pumping and storing breast milk allows you to ensure that your baby is exclusively fed your breast milk, even at times when you are unable to breastfeed. This is especially important if you are going back to work while you are still breastfeeding or when you are not able to be present during feedings. Your lactation consultant can give you guidelines on how long it is safe to store breast milk.  A breast pump is a machine that allows you to pump milk from your breast into a sterile bottle. The pumped breast milk can then be stored in a refrigerator or freezer.  Some breast pumps are operated by hand, while others use electricity. Ask your lactation consultant which type   will work best for you. Breast pumps can be purchased, but some hospitals and breastfeeding support groups lease breast pumps on a monthly basis. A lactation consultant can teach you how to hand express breast milk, if you prefer not to use a pump.  CARING FOR YOUR BREASTS WHILE YOU BREASTFEED Nipples can become dry, cracked, and sore while breastfeeding. The following recommendations can help keep your breasts moisturized and healthy:  Avoid using soap on your nipples.   Wear a supportive bra. Although not required, special nursing bras and tank tops are designed to allow access to your breasts for breastfeeding without taking off your entire bra or top. Avoid wearing underwire-style bras or extremely tight bras.  Air dry your nipples for 3-4minutes after each feeding.   Use only cotton bra pads to absorb leaked breast milk. Leaking of breast milk between feedings is normal.   Use lanolin on your nipples after breastfeeding. Lanolin helps to maintain your skin's normal moisture barrier. If you use pure lanolin, you do not need to wash it off before feeding your baby again. Pure lanolin is not toxic to your baby. You may also hand express a few drops of breast milk and gently massage that milk into your nipples and allow the milk to air dry. In the first few weeks after giving birth, some women experience extremely full breasts (engorgement). Engorgement can make your breasts feel heavy, warm, and tender to the touch. Engorgement peaks within 3-5 days after you give birth. The following recommendations can help ease engorgement:  Completely empty your breasts while breastfeeding or pumping. You may want to start by applying warm, moist heat (in the shower or with warm water-soaked hand towels) just before feeding or pumping. This increases circulation and helps the milk flow. If your baby  does not completely empty your breasts while breastfeeding, pump any extra milk after he or she is finished.  Wear a snug bra (nursing or regular) or tank top for 1-2 days to signal your body to slightly decrease milk production.  Apply ice packs to your breasts, unless this is too uncomfortable for you.  Make sure that your baby is latched on and positioned properly while breastfeeding. If engorgement persists after 48 hours of following these recommendations, contact your health care provider or a lactation consultant. OVERALL HEALTH CARE RECOMMENDATIONS WHILE BREASTFEEDING  Eat healthy foods. Alternate between meals and snacks, eating 3 of each per day. Because what you eat affects your breast milk, some of the foods may make your baby more irritable than usual. Avoid eating these foods if you are sure that they are negatively affecting your baby.  Drink milk, fruit juice, and water to satisfy your thirst (about 10 glasses a day).   Rest often, relax, and continue to take your prenatal vitamins to prevent fatigue, stress, and anemia.  Continue breast self-awareness checks.  Avoid chewing and smoking tobacco.  Avoid alcohol and drug use. Some medicines that may be harmful to your baby can pass through breast milk. It is important to ask your health care provider before taking any medicine, including all over-the-counter and prescription medicine as well as vitamin and herbal supplements. It is possible to become pregnant while breastfeeding. If birth control is desired, ask your health care provider about options that will be safe for your baby. SEEK MEDICAL CARE IF:   You feel like you want to stop breastfeeding or have become frustrated with breastfeeding.  You have painful breasts or nipples.  Your   nipples are cracked or bleeding.  Your breasts are red, tender, or warm.  You have a swollen area on either breast.  You have a fever or chills.  You have nausea or  vomiting.  You have drainage other than breast milk from your nipples.  Your breasts do not become full before feedings by the fifth day after you give birth.  You feel sad and depressed.  Your baby is too sleepy to eat well.  Your baby is having trouble sleeping.   Your baby is wetting less than 3 diapers in a 24-hour period.  Your baby has less than 3 stools in a 24-hour period.  Your baby's skin or the white part of his or her eyes becomes yellow.   Your baby is not gaining weight by 5 days of age. SEEK IMMEDIATE MEDICAL CARE IF:   Your baby is overly tired (lethargic) and does not want to wake up and feed.  Your baby develops an unexplained fever. Document Released: 05/28/2005 Document Revised: 06/02/2013 Document Reviewed: 11/19/2012 [employer] Patient Information 2015 [employer]. This information is not intended to replace advice given to you by your health care provider. Make sure you discuss any questions you have with your health care provider.  

## 2014-01-19 NOTE — Progress Notes (Signed)
NST reviewed and reactive. For AFI today--21.3 today Reports BS are good-- Continue 2x/wk testing BP is borderline--check manual--140/90--denies s/sx's pre-eclampsia.  Will check labs and give precautions.

## 2014-01-20 LAB — COMPREHENSIVE METABOLIC PANEL
ALBUMIN: 2.9 g/dL — AB (ref 3.5–5.2)
ALT: <8 U/L (ref 0–35)
AST: 12 U/L (ref 0–37)
Alkaline Phosphatase: 76 U/L (ref 39–117)
BUN: 7 mg/dL (ref 6–23)
CHLORIDE: 108 meq/L (ref 96–112)
CO2: 22 meq/L (ref 19–32)
Calcium: 8.2 mg/dL — ABNORMAL LOW (ref 8.4–10.5)
Creat: 0.51 mg/dL (ref 0.50–1.10)
Glucose, Bld: 113 mg/dL — ABNORMAL HIGH (ref 70–99)
POTASSIUM: 4.1 meq/L (ref 3.5–5.3)
Sodium: 135 mEq/L (ref 135–145)
Total Bilirubin: 0.2 mg/dL (ref 0.2–1.2)
Total Protein: 5.4 g/dL — ABNORMAL LOW (ref 6.0–8.3)

## 2014-01-20 LAB — CBC
HCT: 32.4 % — ABNORMAL LOW (ref 36.0–46.0)
Hemoglobin: 10.6 g/dL — ABNORMAL LOW (ref 12.0–15.0)
MCH: 26.5 pg (ref 26.0–34.0)
MCHC: 32.7 g/dL (ref 30.0–36.0)
MCV: 81 fL (ref 78.0–100.0)
Platelets: 181 10*3/uL (ref 150–400)
RBC: 4 MIL/uL (ref 3.87–5.11)
RDW: 14.3 % (ref 11.5–15.5)
WBC: 7.1 10*3/uL (ref 4.0–10.5)

## 2014-01-20 LAB — PROTEIN / CREATININE RATIO, URINE
CREATININE, URINE: 84.5 mg/dL
PROTEIN CREATININE RATIO: 0.08 (ref <0.15)
Total Protein, Urine: 7 mg/dL

## 2014-01-21 ENCOUNTER — Other Ambulatory Visit: Payer: No Typology Code available for payment source

## 2014-01-22 ENCOUNTER — Ambulatory Visit (INDEPENDENT_AMBULATORY_CARE_PROVIDER_SITE_OTHER): Payer: No Typology Code available for payment source | Admitting: *Deleted

## 2014-01-22 DIAGNOSIS — E119 Type 2 diabetes mellitus without complications: Secondary | ICD-10-CM

## 2014-01-22 DIAGNOSIS — O24919 Unspecified diabetes mellitus in pregnancy, unspecified trimester: Secondary | ICD-10-CM

## 2014-01-25 ENCOUNTER — Ambulatory Visit (INDEPENDENT_AMBULATORY_CARE_PROVIDER_SITE_OTHER): Payer: No Typology Code available for payment source | Admitting: Family Medicine

## 2014-01-25 ENCOUNTER — Encounter: Payer: Self-pay | Admitting: Family Medicine

## 2014-01-25 VITALS — BP 138/92 | Wt 241.4 lb

## 2014-01-25 DIAGNOSIS — O10919 Unspecified pre-existing hypertension complicating pregnancy, unspecified trimester: Secondary | ICD-10-CM | POA: Insufficient documentation

## 2014-01-25 DIAGNOSIS — Z3400 Encounter for supervision of normal first pregnancy, unspecified trimester: Secondary | ICD-10-CM

## 2014-01-25 DIAGNOSIS — O133 Gestational [pregnancy-induced] hypertension without significant proteinuria, third trimester: Secondary | ICD-10-CM

## 2014-01-25 DIAGNOSIS — O24919 Unspecified diabetes mellitus in pregnancy, unspecified trimester: Secondary | ICD-10-CM

## 2014-01-25 DIAGNOSIS — O139 Gestational [pregnancy-induced] hypertension without significant proteinuria, unspecified trimester: Secondary | ICD-10-CM

## 2014-01-25 NOTE — Patient Instructions (Signed)
Preeclampsia and Eclampsia Preeclampsia is a serious condition that develops only during pregnancy. It is also called toxemia of pregnancy. This condition causes high blood pressure along with other symptoms, such as swelling and headaches. These may develop as the condition gets worse. Preeclampsia may occur 20 weeks or later into your pregnancy.  Diagnosing and treating preeclampsia early is very important. If not treated early, it can cause serious problems for you and your baby. One problem it can lead to is eclampsia, which is a condition that causes muscle jerking or shaking (convulsions) in the mother. Delivering your baby is the best treatment for preeclampsia or eclampsia.  RISK FACTORS The cause of preeclampsia is not known. You may be more likely to develop preeclampsia if you have certain risk factors. These include:   Being pregnant for the first time.  Having preeclampsia in a past pregnancy.  Having a family history of preeclampsia.  Having high blood pressure.  Being pregnant with twins or triplets.  Being 35 or older.  Being African American.  Having kidney disease or diabetes.  Having medical conditions such as lupus or blood diseases.  Being very overweight (obese). SIGNS AND SYMPTOMS  The earliest signs of preeclampsia are:  High blood pressure.  Increased protein in your urine. Your health care provider will check for this at every prenatal visit. Other symptoms that can develop include:   Severe headaches.  Sudden weight gain.  Swelling of your hands, face, legs, and feet.  Feeling sick to your stomach (nauseous) and throwing up (vomiting).  Vision problems (blurred or double vision).  Numbness in your face, arms, legs, and feet.  Dizziness.  Slurred speech.  Sensitivity to bright lights.  Abdominal pain. DIAGNOSIS  There are no screening tests for preeclampsia. Your health care provider will ask you about symptoms and check for signs of  preeclampsia during your prenatal visits. You may also have tests, including:  Urine testing.  Blood testing.  Checking your baby's heart rate.  Checking the health of your baby and your placenta using images created with sound waves (ultrasound). TREATMENT  You can work out the best treatment approach together with your health care provider. It is very important to keep all prenatal appointments. If you have an increased risk of preeclampsia, you may need more frequent prenatal exams.  Your health care provider may prescribe bed rest.  You may have to eat as little salt as possible.  You may need to take medicine to lower your blood pressure if the condition does not respond to more conservative measures.  You may need to stay in the hospital if your condition is severe. There, treatment will focus on controlling your blood pressure and fluid retention. You may also need to take medicine to prevent seizures.  If the condition gets worse, your baby may need to be delivered early to protect you and the baby. You may have your labor started with medicine (be induced), or you may have a cesarean delivery.  Preeclampsia usually goes away after the baby is born. HOME CARE INSTRUCTIONS   Only take over-the-counter or prescription medicines as directed by your health care provider.  Lie on your left side while resting. This keeps pressure off your baby.  Elevate your feet while resting.  Get regular exercise. Ask your health care provider what type of exercise is safe for you.  Avoid caffeine and alcohol.  Do not smoke.  Drink 6-8 glasses of water every day.  Eat a balanced diet   that is low in salt. Do not add salt to your food.  Avoid stressful situations as much as possible.  Get plenty of rest and sleep.  Keep all prenatal appointments and tests as scheduled. SEEK MEDICAL CARE IF:  You are gaining more weight than expected.  You have any headaches, abdominal pain, or  nausea.  You are bruising more than usual.  You feel dizzy or light-headed. SEEK IMMEDIATE MEDICAL CARE IF:   You develop sudden or severe swelling anywhere in your body. This usually happens in the legs.  You gain 5 lb (2.3 kg) or more in a week.  You have a severe headache, dizziness, problems with your vision, or confusion.  You have severe abdominal pain.  You have lasting nausea or vomiting.  You have a seizure.  You have trouble moving any part of your body.  You develop numbness in your body.  You have trouble speaking.  You have any abnormal bleeding.  You develop a stiff neck.  You pass out. MAKE SURE YOU:   Understand these instructions.  Will watch your condition.  Will get help right away if you are not doing well or get worse. Document Released: 05/25/2000 Document Revised: 06/02/2013 Document Reviewed: 03/20/2013 [employer] Patient Information 2015 [employer]. This information is not intended to replace advice given to you by your health care provider. Make sure you discuss any questions you have with your health care provider.  Third Trimester of Pregnancy The third trimester is from week 29 through week 42, months 7 through 9. The third trimester is a time when the fetus is growing rapidly. At the end of the ninth month, the fetus is about 20 inches in length and weighs 6-10 pounds.  BODY CHANGES Your body goes through many changes during pregnancy. The changes vary from woman to woman.   Your weight will continue to increase. You can expect to gain 25-35 pounds (11-16 kg) by the end of the pregnancy.  You may begin to get stretch marks on your hips, abdomen, and breasts.  You may urinate more often because the fetus is moving lower into your pelvis and pressing on your bladder.  You may develop or continue to have heartburn as a result of your pregnancy.  You may develop constipation because certain hormones are causing the muscles that push  waste through your intestines to slow down.  You may develop hemorrhoids or swollen, bulging veins (varicose veins).  You may have pelvic pain because of the weight gain and pregnancy hormones relaxing your joints between the bones in your pelvis. Backaches may result from overexertion of the muscles supporting your posture.  You may have changes in your hair. These can include thickening of your hair, rapid growth, and changes in texture. Some women also have hair loss during or after pregnancy, or hair that feels dry or thin. Your hair will most likely return to normal after your baby is born.  Your breasts will continue to grow and be tender. A yellow discharge may leak from your breasts called colostrum.  Your belly button may stick out.  You may feel short of breath because of your expanding uterus.  You may notice the fetus "dropping," or moving lower in your abdomen.  You may have a bloody mucus discharge. This usually occurs a few days to a week before labor begins.  Your cervix becomes thin and soft (effaced) near your due date. WHAT TO EXPECT AT YOUR PRENATAL EXAMS  You will have   prenatal exams every 2 weeks until week 36. Then, you will have weekly prenatal exams. During a routine prenatal visit:  You will be weighed to make sure you and the fetus are growing normally.  Your blood pressure is taken.  Your abdomen will be measured to track your baby's growth.  The fetal heartbeat will be listened to.  Any test results from the previous visit will be discussed.  You may have a cervical check near your due date to see if you have effaced. At around 36 weeks, your caregiver will check your cervix. At the same time, your caregiver will also perform a test on the secretions of the vaginal tissue. This test is to determine if a type of bacteria, Group B streptococcus, is present. Your caregiver will explain this further. Your caregiver may ask you:  What your birth plan  is.  How you are feeling.  If you are feeling the baby move.  If you have had any abnormal symptoms, such as leaking fluid, bleeding, severe headaches, or abdominal cramping.  If you have any questions. Other tests or screenings that may be performed during your third trimester include:  Blood tests that check for low iron levels (anemia).  Fetal testing to check the health, activity level, and growth of the fetus. Testing is done if you have certain medical conditions or if there are problems during the pregnancy. FALSE LABOR You may feel small, irregular contractions that eventually go away. These are called Braxton Hicks contractions, or false labor. Contractions may last for hours, days, or even weeks before true labor sets in. If contractions come at regular intervals, intensify, or become painful, it is best to be seen by your caregiver.  SIGNS OF LABOR   Menstrual-like cramps.  Contractions that are 5 minutes apart or less.  Contractions that start on the top of the uterus and spread down to the lower abdomen and back.  A sense of increased pelvic pressure or back pain.  A watery or bloody mucus discharge that comes from the vagina. If you have any of these signs before the 37th week of pregnancy, call your caregiver right away. You need to go to the hospital to get checked immediately. HOME CARE INSTRUCTIONS   Avoid all smoking, herbs, alcohol, and unprescribed drugs. These chemicals affect the formation and growth of the baby.  Follow your caregiver's instructions regarding medicine use. There are medicines that are either safe or unsafe to take during pregnancy.  Exercise only as directed by your caregiver. Experiencing uterine cramps is a good sign to stop exercising.  Continue to eat regular, healthy meals.  Wear a good support bra for breast tenderness.  Do not use hot tubs, steam rooms, or saunas.  Wear your seat belt at all times when driving.  Avoid raw  meat, uncooked cheese, cat litter boxes, and soil used by cats. These carry germs that can cause birth defects in the baby.  Take your prenatal vitamins.  Try taking a stool softener (if your caregiver approves) if you develop constipation. Eat more high-fiber foods, such as fresh vegetables or fruit and whole grains. Drink plenty of fluids to keep your urine clear or pale yellow.  Take warm sitz baths to soothe any pain or discomfort caused by hemorrhoids. Use hemorrhoid cream if your caregiver approves.  If you develop varicose veins, wear support hose. Elevate your feet for 15 minutes, 3-4 times a day. Limit salt in your diet.  Avoid heavy lifting, wear low   heal shoes, and practice good posture.  Rest a lot with your legs elevated if you have leg cramps or low back pain.  Visit your dentist if you have not gone during your pregnancy. Use a soft toothbrush to brush your teeth and be gentle when you floss.  A sexual relationship may be continued unless your caregiver directs you otherwise.  Do not travel far distances unless it is absolutely necessary and only with the approval of your caregiver.  Take prenatal classes to understand, practice, and ask questions about the labor and delivery.  Make a trial run to the hospital.  Pack your hospital bag.  Prepare the baby's nursery.  Continue to go to all your prenatal visits as directed by your caregiver. SEEK MEDICAL CARE IF:  You are unsure if you are in labor or if your water has broken.  You have dizziness.  You have mild pelvic cramps, pelvic pressure, or nagging pain in your abdominal area.  You have persistent nausea, vomiting, or diarrhea.  You have a bad smelling vaginal discharge.  You have pain with urination. SEEK IMMEDIATE MEDICAL CARE IF:   You have a fever.  You are leaking fluid from your vagina.  You have spotting or bleeding from your vagina.  You have severe abdominal cramping or pain.  You have  rapid weight loss or gain.  You have shortness of breath with chest pain.  You notice sudden or extreme swelling of your face, hands, ankles, feet, or legs.  You have not felt your baby move in over an hour.  You have severe headaches that do not go away with medicine.  You have vision changes. Document Released: 05/22/2001 Document Revised: 06/02/2013 Document Reviewed: 07/29/2012 [employer] Patient Information 2015 [employer]. This information is not intended to replace advice given to you by your health care provider. Make sure you discuss any questions you have with your health care provider.  Breastfeeding Deciding to breastfeed is one of the best choices you can make for you and your baby. A change in hormones during pregnancy causes your breast tissue to grow and increases the number and size of your milk ducts. These hormones also allow proteins, sugars, and fats from your blood supply to make breast milk in your milk-producing glands. Hormones prevent breast milk from being released before your baby is born as well as prompt milk flow after birth. Once breastfeeding has begun, thoughts of your baby, as well as his or her sucking or crying, can stimulate the release of milk from your milk-producing glands.  BENEFITS OF BREASTFEEDING For Your Baby  Your first milk (colostrum) helps your baby's digestive system function better.   There are antibodies in your milk that help your baby fight off infections.   Your baby has a lower incidence of asthma, allergies, and sudden infant death syndrome.   The nutrients in breast milk are better for your baby than infant formulas and are designed uniquely for your baby's needs.   Breast milk improves your baby's brain development.   Your baby is less likely to develop other conditions, such as childhood obesity, asthma, or type 2 diabetes mellitus.  For You   Breastfeeding helps to create a very special bond between you and your  baby.   Breastfeeding is convenient. Breast milk is always available at the correct temperature and costs nothing.   Breastfeeding helps to burn calories and helps you lose the weight gained during pregnancy.   Breastfeeding makes your uterus contract   to its prepregnancy size faster and slows bleeding (lochia) after you give birth.   Breastfeeding helps to lower your risk of developing type 2 diabetes mellitus, osteoporosis, and breast or ovarian cancer later in life. SIGNS THAT YOUR BABY IS HUNGRY Early Signs of Hunger  Increased alertness or activity.  Stretching.  Movement of the head from side to side.  Movement of the head and opening of the mouth when the corner of the mouth or cheek is stroked (rooting).  Increased sucking sounds, smacking lips, cooing, sighing, or squeaking.  Hand-to-mouth movements.  Increased sucking of fingers or hands. Late Signs of Hunger  Fussing.  Intermittent crying. Extreme Signs of Hunger Signs of extreme hunger will require calming and consoling before your baby will be able to breastfeed successfully. Do not wait for the following signs of extreme hunger to occur before you initiate breastfeeding:   Restlessness.  A loud, strong cry.   Screaming. BREASTFEEDING BASICS Breastfeeding Initiation  Find a comfortable place to sit or lie down, with your neck and back well supported.  Place a pillow or rolled up blanket under your baby to bring him or her to the level of your breast (if you are seated). Nursing pillows are specially designed to help support your arms and your baby while you breastfeed.  Make sure that your baby's abdomen is facing your abdomen.   Gently massage your breast. With your fingertips, massage from your chest wall toward your nipple in a circular motion. This encourages milk flow. You may need to continue this action during the feeding if your milk flows slowly.  Support your breast with 4 fingers  underneath and your thumb above your nipple. Make sure your fingers are well away from your nipple and your baby's mouth.   Stroke your baby's lips gently with your finger or nipple.   When your baby's mouth is open wide enough, quickly bring your baby to your breast, placing your entire nipple and as much of the colored area around your nipple (areola) as possible into your baby's mouth.   More areola should be visible above your baby's upper lip than below the lower lip.   Your baby's tongue should be between his or her lower gum and your breast.   Ensure that your baby's mouth is correctly positioned around your nipple (latched). Your baby's lips should create a seal on your breast and be turned out (everted).  It is common for your baby to suck about 2-3 minutes in order to start the flow of breast milk. Latching Teaching your baby how to latch on to your breast properly is very important. An improper latch can cause nipple pain and decreased milk supply for you and poor weight gain in your baby. Also, if your baby is not latched onto your nipple properly, he or she may swallow some air during feeding. This can make your baby fussy. Burping your baby when you switch breasts during the feeding can help to get rid of the air. However, teaching your baby to latch on properly is still the best way to prevent fussiness from swallowing air while breastfeeding. Signs that your baby has successfully latched on to your nipple:    Silent tugging or silent sucking, without causing you pain.   Swallowing heard between every 3-4 sucks.    Muscle movement above and in front of his or her ears while sucking.  Signs that your baby has not successfully latched on to nipple:   Sucking sounds   or smacking sounds from your baby while breastfeeding.  Nipple pain. If you think your baby has not latched on correctly, slip your finger into the corner of your baby's mouth to break the suction and place  it between your baby's gums. Attempt breastfeeding initiation again. Signs of Successful Breastfeeding Signs from your baby:   A gradual decrease in the number of sucks or complete cessation of sucking.   Falling asleep.   Relaxation of his or her body.   Retention of a small amount of milk in his or her mouth.   Letting go of your breast by himself or herself. Signs from you:  Breasts that have increased in firmness, weight, and size 1-3 hours after feeding.   Breasts that are softer immediately after breastfeeding.  Increased milk volume, as well as a change in milk consistency and color by the fifth day of breastfeeding.   Nipples that are not sore, cracked, or bleeding. Signs That Your Baby is Getting Enough Milk  Wetting at least 3 diapers in a 24-hour period. The urine should be clear and pale yellow by age 5 days.  At least 3 stools in a 24-hour period by age 5 days. The stool should be soft and yellow.  At least 3 stools in a 24-hour period by age 7 days. The stool should be seedy and yellow.  No loss of weight greater than 10% of birth weight during the first 3 days of age.  Average weight gain of 4-7 ounces (113-198 g) per week after age 4 days.  Consistent daily weight gain by age 5 days, without weight loss after the age of 2 weeks. After a feeding, your baby may spit up a small amount. This is common. BREASTFEEDING FREQUENCY AND DURATION Frequent feeding will help you make more milk and can prevent sore nipples and breast engorgement. Breastfeed when you feel the need to reduce the fullness of your breasts or when your baby shows signs of hunger. This is called "breastfeeding on demand." Avoid introducing a pacifier to your baby while you are working to establish breastfeeding (the first 4-6 weeks after your baby is born). After this time you may choose to use a pacifier. Research has shown that pacifier use during the first year of a baby's life decreases the  risk of sudden infant death syndrome (SIDS). Allow your baby to feed on each breast as long as he or she wants. Breastfeed until your baby is finished feeding. When your baby unlatches or falls asleep while feeding from the first breast, offer the second breast. Because newborns are often sleepy in the first few weeks of life, you may need to awaken your baby to get him or her to feed. Breastfeeding times will vary from baby to baby. However, the following rules can serve as a guide to help you ensure that your baby is properly fed:  Newborns (babies 4 weeks of age or younger) may breastfeed every 1-3 hours.  Newborns should not go longer than 3 hours during the day or 5 hours during the night without breastfeeding.  You should breastfeed your baby a minimum of 8 times in a 24-hour period until you begin to introduce solid foods to your baby at around 6 months of age. BREAST MILK PUMPING Pumping and storing breast milk allows you to ensure that your baby is exclusively fed your breast milk, even at times when you are unable to breastfeed. This is especially important if you are going back to work   while you are still breastfeeding or when you are not able to be present during feedings. Your lactation consultant can give you guidelines on how long it is safe to store breast milk.  A breast pump is a machine that allows you to pump milk from your breast into a sterile bottle. The pumped breast milk can then be stored in a refrigerator or freezer. Some breast pumps are operated by hand, while others use electricity. Ask your lactation consultant which type will work best for you. Breast pumps can be purchased, but some hospitals and breastfeeding support groups lease breast pumps on a monthly basis. A lactation consultant can teach you how to hand express breast milk, if you prefer not to use a pump.  CARING FOR YOUR BREASTS WHILE YOU BREASTFEED Nipples can become dry, cracked, and sore while breastfeeding.  The following recommendations can help keep your breasts moisturized and healthy:  Avoid using soap on your nipples.   Wear a supportive bra. Although not required, special nursing bras and tank tops are designed to allow access to your breasts for breastfeeding without taking off your entire bra or top. Avoid wearing underwire-style bras or extremely tight bras.  Air dry your nipples for 3-4minutes after each feeding.   Use only cotton bra pads to absorb leaked breast milk. Leaking of breast milk between feedings is normal.   Use lanolin on your nipples after breastfeeding. Lanolin helps to maintain your skin's normal moisture barrier. If you use pure lanolin, you do not need to wash it off before feeding your baby again. Pure lanolin is not toxic to your baby. You may also hand express a few drops of breast milk and gently massage that milk into your nipples and allow the milk to air dry. In the first few weeks after giving birth, some women experience extremely full breasts (engorgement). Engorgement can make your breasts feel heavy, warm, and tender to the touch. Engorgement peaks within 3-5 days after you give birth. The following recommendations can help ease engorgement:  Completely empty your breasts while breastfeeding or pumping. You may want to start by applying warm, moist heat (in the shower or with warm water-soaked hand towels) just before feeding or pumping. This increases circulation and helps the milk flow. If your baby does not completely empty your breasts while breastfeeding, pump any extra milk after he or she is finished.  Wear a snug bra (nursing or regular) or tank top for 1-2 days to signal your body to slightly decrease milk production.  Apply ice packs to your breasts, unless this is too uncomfortable for you.  Make sure that your baby is latched on and positioned properly while breastfeeding. If engorgement persists after 48 hours of following these  recommendations, contact your health care provider or a lactation consultant. OVERALL HEALTH CARE RECOMMENDATIONS WHILE BREASTFEEDING  Eat healthy foods. Alternate between meals and snacks, eating 3 of each per day. Because what you eat affects your breast milk, some of the foods may make your baby more irritable than usual. Avoid eating these foods if you are sure that they are negatively affecting your baby.  Drink milk, fruit juice, and water to satisfy your thirst (about 10 glasses a day).   Rest often, relax, and continue to take your prenatal vitamins to prevent fatigue, stress, and anemia.  Continue breast self-awareness checks.  Avoid chewing and smoking tobacco.  Avoid alcohol and drug use. Some medicines that may be harmful to your baby can pass through   breast milk. It is important to ask your health care provider before taking any medicine, including all over-the-counter and prescription medicine as well as vitamin and herbal supplements. It is possible to become pregnant while breastfeeding. If birth control is desired, ask your health care provider about options that will be safe for your baby. SEEK MEDICAL CARE IF:   You feel like you want to stop breastfeeding or have become frustrated with breastfeeding.  You have painful breasts or nipples.  Your nipples are cracked or bleeding.  Your breasts are red, tender, or warm.  You have a swollen area on either breast.  You have a fever or chills.  You have nausea or vomiting.  You have drainage other than breast milk from your nipples.  Your breasts do not become full before feedings by the fifth day after you give birth.  You feel sad and depressed.  Your baby is too sleepy to eat well.  Your baby is having trouble sleeping.   Your baby is wetting less than 3 diapers in a 24-hour period.  Your baby has less than 3 stools in a 24-hour period.  Your baby's skin or the white part of his or her eyes becomes  yellow.   Your baby is not gaining weight by 5 days of age. SEEK IMMEDIATE MEDICAL CARE IF:   Your baby is overly tired (lethargic) and does not want to wake up and feed.  Your baby develops an unexplained fever. Document Released: 05/28/2005 Document Revised: 06/02/2013 Document Reviewed: 11/19/2012 Exit[employer] Patient Information 2015 [employer]. This information is not intended to replace advice given to you by your health care provider. Make sure you discuss any questions you have with your health care provider.  

## 2014-01-25 NOTE — Progress Notes (Signed)
NST reviewed and reactive. Reports BS are good---no book again Labs are good from last week.  Pr/Cr is 0.08. AFI is 22 today Re-look at BP's in the non-pregnant state, suggest possible underlying CHTN--if labs or sx's, could treat as GHTN with delivery at 37 wks..blood pressure is mildly elevated, no proteinuria--no symptoms, normal labs Also, needs cultures at end of week or early next week.

## 2014-01-26 LAB — CULTURE, OB URINE: Colony Count: 40000

## 2014-01-28 ENCOUNTER — Ambulatory Visit (INDEPENDENT_AMBULATORY_CARE_PROVIDER_SITE_OTHER): Payer: No Typology Code available for payment source | Admitting: *Deleted

## 2014-01-28 DIAGNOSIS — E119 Type 2 diabetes mellitus without complications: Secondary | ICD-10-CM

## 2014-01-28 DIAGNOSIS — O24919 Unspecified diabetes mellitus in pregnancy, unspecified trimester: Secondary | ICD-10-CM

## 2014-02-01 ENCOUNTER — Ambulatory Visit (INDEPENDENT_AMBULATORY_CARE_PROVIDER_SITE_OTHER): Payer: No Typology Code available for payment source | Admitting: Obstetrics & Gynecology

## 2014-02-01 VITALS — BP 126/87 | HR 116 | Wt 245.0 lb

## 2014-02-01 DIAGNOSIS — O24319 Unspecified pre-existing diabetes mellitus in pregnancy, unspecified trimester: Secondary | ICD-10-CM

## 2014-02-01 DIAGNOSIS — O0993 Supervision of high risk pregnancy, unspecified, third trimester: Secondary | ICD-10-CM

## 2014-02-01 DIAGNOSIS — O0990 Supervision of high risk pregnancy, unspecified, unspecified trimester: Secondary | ICD-10-CM

## 2014-02-01 DIAGNOSIS — IMO0001 Reserved for inherently not codable concepts without codable children: Secondary | ICD-10-CM

## 2014-02-01 DIAGNOSIS — E119 Type 2 diabetes mellitus without complications: Secondary | ICD-10-CM

## 2014-02-01 DIAGNOSIS — Z3685 Encounter for antenatal screening for Streptococcus B: Secondary | ICD-10-CM

## 2014-02-01 DIAGNOSIS — O24919 Unspecified diabetes mellitus in pregnancy, unspecified trimester: Secondary | ICD-10-CM

## 2014-02-01 DIAGNOSIS — Z113 Encounter for screening for infections with a predominantly sexual mode of transmission: Secondary | ICD-10-CM

## 2014-02-01 DIAGNOSIS — O24313 Unspecified pre-existing diabetes mellitus in pregnancy, third trimester: Secondary | ICD-10-CM

## 2014-02-01 DIAGNOSIS — E1165 Type 2 diabetes mellitus with hyperglycemia: Secondary | ICD-10-CM

## 2014-02-01 LAB — OB RESULTS CONSOLE GBS: STREP GROUP B AG: POSITIVE

## 2014-02-01 NOTE — Progress Notes (Signed)
BP stable at 126/87 today.  Reviewed nonpregnant BP; patient had some recorded BPs during ED visits in 2013 and one in 10/2012 with SBP>140 and/or DBP> 90.  This is suggestive of possible CHTN.  Initial BP this pregnancy was 124/88; had episode at 28 weeks of 123/99; then 120-140/90-97 at her visits in 34 and 35 weeks. Recent Pr/Cr 0.08, normal CBC and CMP. NST reactive today; AFI normal at 18.5 cm. Discussed that there is no urgent indication for induction; will continue twice a week testing and close BP monitoring for now.  Growth scan scheduled in one week. Plan is for delivery at 39 weeks; unless BPs become more concerning.   Patient reports good BS, forgot log book again (this has occurred multiple times this pregnancy). Pelvic cultures today. Preeclampsia, labor and fetal movement precautions reviewed.

## 2014-02-01 NOTE — Patient Instructions (Signed)
Return to clinic for any obstetric concerns or go to [facility] for evaluation  

## 2014-02-02 LAB — GC/CHLAMYDIA PROBE AMP
CT Probe RNA: NEGATIVE
GC PROBE AMP APTIMA: NEGATIVE

## 2014-02-02 LAB — CULTURE, BETA STREP (GROUP B ONLY)

## 2014-02-04 ENCOUNTER — Encounter: Payer: Self-pay | Admitting: Obstetrics & Gynecology

## 2014-02-04 ENCOUNTER — Ambulatory Visit (INDEPENDENT_AMBULATORY_CARE_PROVIDER_SITE_OTHER): Payer: No Typology Code available for payment source | Admitting: *Deleted

## 2014-02-04 DIAGNOSIS — O24919 Unspecified diabetes mellitus in pregnancy, unspecified trimester: Secondary | ICD-10-CM

## 2014-02-04 DIAGNOSIS — IMO0001 Reserved for inherently not codable concepts without codable children: Secondary | ICD-10-CM

## 2014-02-04 DIAGNOSIS — E1165 Type 2 diabetes mellitus with hyperglycemia: Principal | ICD-10-CM

## 2014-02-04 DIAGNOSIS — O99820 Streptococcus B carrier state complicating pregnancy: Secondary | ICD-10-CM | POA: Insufficient documentation

## 2014-02-05 ENCOUNTER — Encounter (HOSPITAL_COMMUNITY): Payer: Self-pay

## 2014-02-08 ENCOUNTER — Encounter: Payer: Self-pay | Admitting: *Deleted

## 2014-02-08 ENCOUNTER — Encounter (HOSPITAL_COMMUNITY): Payer: Self-pay

## 2014-02-08 ENCOUNTER — Ambulatory Visit (INDEPENDENT_AMBULATORY_CARE_PROVIDER_SITE_OTHER): Payer: Medicaid Other | Admitting: *Deleted

## 2014-02-08 ENCOUNTER — Ambulatory Visit (HOSPITAL_COMMUNITY)
Admission: RE | Admit: 2014-02-08 | Discharge: 2014-02-08 | Disposition: A | Discharge status: Home / Self Care | Payer: No Typology Code available for payment source | Source: Ambulatory Visit | Attending: Family Medicine | Admitting: Family Medicine

## 2014-02-08 VITALS — BP 135/93 | HR 93 | Wt 249.2 lb

## 2014-02-08 DIAGNOSIS — Z3689 Encounter for other specified antenatal screening: Secondary | ICD-10-CM | POA: Diagnosis not present

## 2014-02-08 DIAGNOSIS — O24919 Unspecified diabetes mellitus in pregnancy, unspecified trimester: Secondary | ICD-10-CM

## 2014-02-08 DIAGNOSIS — E119 Type 2 diabetes mellitus without complications: Secondary | ICD-10-CM | POA: Diagnosis not present

## 2014-02-08 DIAGNOSIS — O24313 Unspecified pre-existing diabetes mellitus in pregnancy, third trimester: Secondary | ICD-10-CM

## 2014-02-08 NOTE — Progress Notes (Signed)
NST is reactive/ Growth ultrasound done at [facility] hospital this morning.  Patient will return Thursday for NST and OB appointment.

## 2014-02-09 ENCOUNTER — Ambulatory Visit (HOSPITAL_COMMUNITY): Payer: No Typology Code available for payment source

## 2014-02-11 ENCOUNTER — Encounter: Payer: No Typology Code available for payment source | Admitting: Obstetrics & Gynecology

## 2014-02-12 ENCOUNTER — Encounter: Payer: No Typology Code available for payment source | Admitting: Obstetrics and Gynecology

## 2014-02-17 ENCOUNTER — Inpatient Hospital Stay (HOSPITAL_COMMUNITY)
Admission: AD | Admit: 2014-02-17 | Discharge: 2014-02-24 | DRG: 766 | Disposition: A | Discharge status: Home / Self Care | Payer: Medicaid Other | Source: Ambulatory Visit | Attending: Obstetrics & Gynecology | Admitting: Obstetrics & Gynecology

## 2014-02-17 ENCOUNTER — Ambulatory Visit (INDEPENDENT_AMBULATORY_CARE_PROVIDER_SITE_OTHER): Payer: No Typology Code available for payment source | Admitting: Obstetrics and Gynecology

## 2014-02-17 ENCOUNTER — Encounter: Payer: No Typology Code available for payment source | Admitting: Advanced Practice Midwife

## 2014-02-17 ENCOUNTER — Encounter (HOSPITAL_COMMUNITY): Payer: Self-pay | Admitting: *Deleted

## 2014-02-17 ENCOUNTER — Encounter: Payer: Self-pay | Admitting: Obstetrics and Gynecology

## 2014-02-17 ENCOUNTER — Other Ambulatory Visit: Payer: Self-pay | Admitting: Family Medicine

## 2014-02-17 VITALS — BP 144/107 | HR 105 | Wt 251.0 lb

## 2014-02-17 DIAGNOSIS — O99214 Obesity complicating childbirth: Secondary | ICD-10-CM

## 2014-02-17 DIAGNOSIS — O99892 Other specified diseases and conditions complicating childbirth: Secondary | ICD-10-CM | POA: Diagnosis present

## 2014-02-17 DIAGNOSIS — O24313 Unspecified pre-existing diabetes mellitus in pregnancy, third trimester: Secondary | ICD-10-CM

## 2014-02-17 DIAGNOSIS — O0993 Supervision of high risk pregnancy, unspecified, third trimester: Secondary | ICD-10-CM

## 2014-02-17 DIAGNOSIS — O99891 Other specified diseases and conditions complicating pregnancy: Secondary | ICD-10-CM

## 2014-02-17 DIAGNOSIS — IMO0001 Reserved for inherently not codable concepts without codable children: Secondary | ICD-10-CM

## 2014-02-17 DIAGNOSIS — Z6836 Body mass index (BMI) 36.0-36.9, adult: Secondary | ICD-10-CM | POA: Diagnosis not present

## 2014-02-17 DIAGNOSIS — O9989 Other specified diseases and conditions complicating pregnancy, childbirth and the puerperium: Secondary | ICD-10-CM

## 2014-02-17 DIAGNOSIS — Z833 Family history of diabetes mellitus: Secondary | ICD-10-CM

## 2014-02-17 DIAGNOSIS — Z8249 Family history of ischemic heart disease and other diseases of the circulatory system: Secondary | ICD-10-CM | POA: Diagnosis not present

## 2014-02-17 DIAGNOSIS — O24919 Unspecified diabetes mellitus in pregnancy, unspecified trimester: Secondary | ICD-10-CM

## 2014-02-17 DIAGNOSIS — E669 Obesity, unspecified: Secondary | ICD-10-CM | POA: Diagnosis present

## 2014-02-17 DIAGNOSIS — O34219 Maternal care for unspecified type scar from previous cesarean delivery: Secondary | ICD-10-CM

## 2014-02-17 DIAGNOSIS — O99330 Smoking (tobacco) complicating pregnancy, unspecified trimester: Secondary | ICD-10-CM

## 2014-02-17 DIAGNOSIS — Z22330 Carrier of Group B streptococcus: Secondary | ICD-10-CM

## 2014-02-17 DIAGNOSIS — Z98891 History of uterine scar from previous surgery: Secondary | ICD-10-CM

## 2014-02-17 DIAGNOSIS — E1165 Type 2 diabetes mellitus with hyperglycemia: Principal | ICD-10-CM

## 2014-02-17 DIAGNOSIS — O99820 Streptococcus B carrier state complicating pregnancy: Secondary | ICD-10-CM

## 2014-02-17 DIAGNOSIS — O10019 Pre-existing essential hypertension complicating pregnancy, unspecified trimester: Secondary | ICD-10-CM

## 2014-02-17 DIAGNOSIS — Z87891 Personal history of nicotine dependence: Secondary | ICD-10-CM | POA: Diagnosis not present

## 2014-02-17 DIAGNOSIS — O139 Gestational [pregnancy-induced] hypertension without significant proteinuria, unspecified trimester: Principal | ICD-10-CM

## 2014-02-17 DIAGNOSIS — Z3400 Encounter for supervision of normal first pregnancy, unspecified trimester: Secondary | ICD-10-CM

## 2014-02-17 LAB — COMPREHENSIVE METABOLIC PANEL
ALK PHOS: 106 U/L (ref 39–117)
ALT: 7 U/L (ref 0–35)
ANION GAP: 9 (ref 5–15)
AST: 14 U/L (ref 0–37)
Albumin: 2.5 g/dL — ABNORMAL LOW (ref 3.5–5.2)
BUN: 9 mg/dL (ref 6–23)
CALCIUM: 8.6 mg/dL (ref 8.4–10.5)
CO2: 23 mEq/L (ref 19–32)
Chloride: 103 mEq/L (ref 96–112)
Creatinine, Ser: 0.59 mg/dL (ref 0.50–1.10)
GFR calc non Af Amer: >90 mL/min (ref >90)
GLUCOSE: 120 mg/dL — AB (ref 70–99)
Potassium: 4.2 mEq/L (ref 3.7–5.3)
Sodium: 135 mEq/L — ABNORMAL LOW (ref 137–147)
Total Bilirubin: <0.2 mg/dL — ABNORMAL LOW (ref 0.3–1.2)
Total Protein: 6.2 g/dL (ref 6.0–8.3)

## 2014-02-17 LAB — CBC
HEMATOCRIT: 32.6 % — AB (ref 36.0–46.0)
Hemoglobin: 11.2 g/dL — ABNORMAL LOW (ref 12.0–15.0)
MCH: 27.5 pg (ref 26.0–34.0)
MCHC: 34.4 g/dL (ref 30.0–36.0)
MCV: 79.9 fL (ref 78.0–100.0)
PLATELETS: 182 10*3/uL (ref 150–400)
RBC: 4.08 MIL/uL (ref 3.87–5.11)
RDW: 14.5 % (ref 11.5–15.5)
WBC: 8.5 10*3/uL (ref 4.0–10.5)

## 2014-02-17 LAB — TYPE AND SCREEN
ABO/RH(D): O POS
Antibody Screen: NEGATIVE

## 2014-02-17 MED ORDER — CITRIC ACID-SODIUM CITRATE 334-500 MG/5ML PO SOLN
30.0000 mL | ORAL | Status: DC | PRN
  Administered 2014-02-20: 30 mL via ORAL
  Filled 2014-02-17: qty 15

## 2014-02-17 MED ORDER — OXYCODONE-ACETAMINOPHEN 5-325 MG PO TABS: 1.0000 | ORAL_TABLET | ORAL | Status: DC | PRN

## 2014-02-17 MED ORDER — OXYTOCIN 40 UNITS IN LACTATED RINGERS INFUSION - SIMPLE MED: 62.5000 mL/h | INTRAVENOUS | Status: DC

## 2014-02-17 MED ORDER — LIDOCAINE HCL (PF) 1 % IJ SOLN
30.0000 mL | INTRAMUSCULAR | Status: DC | PRN
  Filled 2014-02-17: qty 30

## 2014-02-17 MED ORDER — ONDANSETRON HCL 4 MG/2ML IJ SOLN: 4.0000 mg | Freq: Four times a day (QID) | INTRAMUSCULAR | Status: DC | PRN

## 2014-02-17 MED ORDER — INSULIN DETEMIR 100 UNIT/ML ~~LOC~~ SOLN
40.0000 [IU] | Freq: Every day | SUBCUTANEOUS | Status: DC
  Administered 2014-02-17 – 2014-02-18 (×2): 40 [IU] via SUBCUTANEOUS
  Filled 2014-02-17 (×3): qty 0.4

## 2014-02-17 MED ORDER — OXYCODONE-ACETAMINOPHEN 5-325 MG PO TABS: 2.0000 | ORAL_TABLET | ORAL | Status: DC | PRN

## 2014-02-17 MED ORDER — MISOPROSTOL 25 MCG QUARTER TABLET
50.0000 ug | ORAL_TABLET | ORAL | Status: DC | PRN
  Administered 2014-02-17 – 2014-02-18 (×3): 50 ug via ORAL
  Filled 2014-02-17 (×4): qty 0.5

## 2014-02-17 MED ORDER — LACTATED RINGERS IV SOLN: 500.0000 mL | INTRAVENOUS | Status: DC | PRN

## 2014-02-17 MED ORDER — OXYTOCIN BOLUS FROM INFUSION: 500.0000 mL | INTRAVENOUS | Status: DC

## 2014-02-17 MED ORDER — TERBUTALINE SULFATE 1 MG/ML IJ SOLN: 0.2500 mg | Freq: Once | INTRAMUSCULAR | Status: AC | PRN

## 2014-02-17 MED ORDER — ACETAMINOPHEN 325 MG PO TABS: 650.0000 mg | ORAL_TABLET | ORAL | Status: DC | PRN

## 2014-02-17 MED ORDER — LACTATED RINGERS IV SOLN
INTRAVENOUS | Status: DC
  Administered 2014-02-17 – 2014-02-20 (×5): via INTRAVENOUS

## 2014-02-17 NOTE — H&P (Signed)
LABOR ADMISSION HISTORY AND PHYSICAL  Joyce Green is a 28 y.o. female G1P0 with IUP at [redacted]w[redacted]d by LMP presenting for IOL 2/2 gestational HTN and BDM. She reports +FMs, No LOF, no VB, no blurry vision, headaches or peripheral edema, and RUQ pain. She desires an epidural for labor pain control. She plans on breast feeding.   Dating: By LMP cw 18w sono --->  Estimated Date of Delivery: 02/25/14   Prenatal History/Complications:  Past Medical History: Past Medical History  Diagnosis Date  . Palpitations   . Diabetes mellitus without complication     Past Surgical History: History reviewed. No pertinent past surgical history.  Obstetrical History: OB History   Grav Para Term Preterm Abortions TAB SAB Ect Mult Living   1               Social History: History   Social History  . Marital Status: Married    Spouse Name: N/A    Number of Children: N/A  . Years of Education: N/A   Social History Main Topics  . Smoking status: Former Smoker -- 0.00 packs/day for 0 years    Quit date: 06/11/2009  . Smokeless tobacco: Never Used  . Alcohol Use: No     Comment: occasional; stopped after found out she was pregnant  . Drug Use: No  . Sexual Activity: Yes    Partners: Male    Birth Control/ Protection: None   Other Topics Concern  . None   Social History Narrative  . None    Family History: Family History  Problem Relation Age of Onset  . Diabetes Mellitus I Mother   . Hypertension Mother   . Diabetes Mother   . Hypertension Father   . Cancer Neg Hx   . Early death Neg Hx   . Heart disease Neg Hx   . Hyperlipidemia Neg Hx   . Kidney disease Neg Hx   . Stroke Neg Hx     Allergies: No Known Allergies  Prescriptions prior to admission  Medication Sig Dispense Refill  . acetaminophen (TYLENOL) 325 MG tablet Take 650 mg by mouth every 6 (six) hours as needed.      Marland Kitchen aspirin EC 81 MG tablet Take 1 tablet (81 mg total) by mouth daily.  30 tablet  10  . glucose  blood (ACCU-CHEK SMARTVIEW) test strip Test BID dx:250.02  200 each  3  . Insulin Detemir (LEVEMIR FLEXTOUCH) 100 UNIT/ML SOPN Inject 50 Units into the skin at bedtime.  9 mL  3  . Insulin Pen Needle 31G X 5 MM MISC Test BID dx:250.02  200 each  3  . Lancets (ACCU-CHEK SOFT TOUCH) lancets Test BID dx:250.02  200 each  3  . metFORMIN (GLUCOPHAGE) 1000 MG tablet Take 1 tablet (1,000 mg total) by mouth 2 (two) times daily with a meal.  180 tablet  3  . Prenatal Multivit-Min-Fe-FA (PRENATAL VITAMINS PO) Take by mouth.         Review of Systems   All systems reviewed and negative except as stated in HPI  Blood pressure 147/94, pulse 104, height  (1.778 m), weight 251 lb (113.853 kg), last menstrual period 05/21/2013. General appearance: alert and cooperative Lungs: clear to auscultation bilaterally Heart: regular rate and rhythm Abdomen: soft, non-tender; bowel sounds normal Extremities: Homans sign is negative, no sign of DVT DTR's normal Presentation: cephalic, confirmed with ultrasound by me Fetal monitoringreactive Uterine activityNone     Prenatal labs: ABO, Rh: O/Positive/-- (  03/12 0000) Antibody: Negative (03/12 0000) Rubella:   RPR: NON REAC (06/26 1113)  HBsAg:    HIV: NONREACTIVE (06/26 1113)  GBS:    Hemoglobin A1c 6.6, BDM Genetic screening  Quad normal Anatomy US normal   No results found for this or any previous visit (from the past 24 hour(s)).  Patient Active Problem List   Diagnosis Date Noted  . Gestational hypertension 02/17/2014  . Group B Streptococcus carrier, +RV culture, currently pregnant 02/04/2014  . ? of Benign essential hypertension antepartum 01/25/2014  . Supervision of high-risk pregnancy 09/28/2013  . Tobacco use disorder 01/08/2013  . Obesity (BMI 30-39.9) 01/08/2013  . Type II or unspecified type diabetes mellitus without mention of complication, uncontrolled 01/07/2013  . Preexisting diabetes complicating pregnancy, antepartum  11/07/2012    Assessment: Joyce Green is a 28 y.o. G1P0 at [redacted]w[redacted]d here for IOL 2/2 gHTN and BDM   #Labor:cytotec for cervical ripening #Pain: Fentanyl prn #FWB: Cat I #ID:  penicillin #MOF: breast #MOC: would like contraception for 1-2 years, likely OCPs vs nuvaring/patch/depo #Circ:  N/a, female  Joyce Green Joyce Green 02/17/2014, 9:01 PM

## 2014-02-17 NOTE — Progress Notes (Signed)
Patient is doing well without complaints. CBGs fasting within range postprandial not always checked but majority within range. Elevated values as high as 153 and patient admits to not adhering to the diet. Elevated BP today. Will send to [facility] hospital for IOL. Attending on call contacted

## 2014-02-18 LAB — GLUCOSE, CAPILLARY
GLUCOSE-CAPILLARY: 75 mg/dL (ref 70–99)
GLUCOSE-CAPILLARY: 184 mg/dL — AB (ref 70–99)
Glucose-Capillary: 118 mg/dL — ABNORMAL HIGH (ref 70–99)
Glucose-Capillary: 77 mg/dL (ref 70–99)
Glucose-Capillary: 88 mg/dL (ref 70–99)
Glucose-Capillary: 90 mg/dL (ref 70–99)

## 2014-02-18 LAB — PROTEIN / CREATININE RATIO, URINE
Creatinine, Urine: 95.56 mg/dL
PROTEIN CREATININE RATIO: 0.15 (ref 0.00–0.15)
Total Protein, Urine: 14.2 mg/dL

## 2014-02-18 LAB — RPR

## 2014-02-18 LAB — ABO/RH: ABO/RH(D): O POS

## 2014-02-18 MED ORDER — MISOPROSTOL 25 MCG QUARTER TABLET
25.0000 ug | ORAL_TABLET | ORAL | Status: DC | PRN
  Administered 2014-02-18 – 2014-02-19 (×4): 25 ug via VAGINAL
  Filled 2014-02-18 (×4): qty 0.25

## 2014-02-18 MED ORDER — ZOLPIDEM TARTRATE 5 MG PO TABS
5.0000 mg | ORAL_TABLET | Freq: Every evening | ORAL | Status: DC | PRN
  Administered 2014-02-18 – 2014-02-19 (×2): 5 mg via ORAL
  Filled 2014-02-18 (×2): qty 1

## 2014-02-18 MED ORDER — PENICILLIN G POTASSIUM 5000000 UNITS IJ SOLR
2.5000 10*6.[IU] | INTRAVENOUS | Status: DC
  Filled 2014-02-18 (×10): qty 2.5

## 2014-02-18 MED ORDER — PENICILLIN G POTASSIUM 5000000 UNITS IJ SOLR
5.0000 10*6.[IU] | Freq: Once | INTRAVENOUS | Status: DC
  Filled 2014-02-18 (×2): qty 5

## 2014-02-18 NOTE — Progress Notes (Signed)
Javia Dillow is a 28 y.o. G1P0 at [redacted]w[redacted]d.  Subjective: Mild cramping.   Objective: BP 127/76  Pulse 86  Temp(Src) 97.6 F (36.4 C) (Oral)  Resp 20  Ht  (1.778 m)  Wt 113.853 kg (251 lb)  BMI 36.01 kg/m2  LMP 05/21/2013     FHT:  FHR: 130 bpm, variability: moderate,  accelerations:  Present,  decelerations:  Absent UC:   irregular, mild SVE:   Dilation: Closed Effacement (%): Thick Station: -3 Exam by:: Ivonne Andrew, CNM  Labs: Lab Results  Component Value Date   WBC 8.5 02/17/2014   HGB 11.2* 02/17/2014   HCT 32.6* 02/17/2014   MCV 79.9 02/17/2014   PLT 182 02/17/2014    Assessment / Plan: Induction of labor due to gestational diabetes and GDM,  progressing well on pitocin  Labor: Progressing normally Preeclampsia:  labs stable Fetal Wellbeing:  Category I Pain Control:  Labor support without medications I/D:  n/a Anticipated MOD:  NSVD Change to PV cytotec  Carmyn Hamm 02/18/2014, 12:09 PM

## 2014-02-18 NOTE — Progress Notes (Signed)
Joyce Green is a 28 y.o. G1P0 at [redacted]w[redacted]d by LMP c/w 18wk sono admitted for induction of labor due to gestational HTN and Class B DM.    Subjective:  Pt resting comfortably in bed. Reports heavy cramping. Good fetal movement. No leakage of fluid or vaginal bleeding.  Objective: BP 156/92  Pulse 91  Temp(Src) 98.1 F (36.7 C) (Oral)  Resp 18  Ht  (1.778 m)  Wt 113.853 kg (251 lb)  BMI 36.01 kg/m2  LMP 05/21/2013      FHT:  FHR: 135 bpm, variability: moderate,  accelerations:  Present,  decelerations:  Absent UC:   Irregular,  SVE:   Dilation: Fingertip Effacement (%): Thick Station: -3 Exam by:: felkel,rn  Labs: Lab Results  Component Value Date   WBC 8.5 02/17/2014   HGB 11.2* 02/17/2014   HCT 32.6* 02/17/2014   MCV 79.9 02/17/2014   PLT 182 02/17/2014    Assessment / Plan: Induction of labor due to GHTN and Class B DM. Currently undergoing cervical ripening.  Labor: not in early labor yet. cytotec replaced vaginally per nurse. Preeclampsia:  no signs or symptoms of toxicity Fetal Wellbeing:  Category I Pain Control:  Labor support without medications I/D:  Will start PCN when labor commences Anticipated MOD:  NSVD  Tomma Rakers, MD, PGY3 02/18/2014, 10:08 PM

## 2014-02-19 ENCOUNTER — Encounter (HOSPITAL_COMMUNITY): Payer: Medicaid Other | Admitting: Anesthesiology

## 2014-02-19 ENCOUNTER — Encounter (HOSPITAL_COMMUNITY): Payer: Self-pay | Admitting: *Deleted

## 2014-02-19 ENCOUNTER — Inpatient Hospital Stay (HOSPITAL_COMMUNITY): Payer: Medicaid Other | Admitting: Anesthesiology

## 2014-02-19 LAB — CBC
HCT: 34.9 % — ABNORMAL LOW (ref 36.0–46.0)
HEMOGLOBIN: 11.9 g/dL — AB (ref 12.0–15.0)
MCH: 27.2 pg (ref 26.0–34.0)
MCHC: 34.1 g/dL (ref 30.0–36.0)
MCV: 79.9 fL (ref 78.0–100.0)
Platelets: 174 10*3/uL (ref 150–400)
RBC: 4.37 MIL/uL (ref 3.87–5.11)
RDW: 14.3 % (ref 11.5–15.5)
WBC: 13.2 10*3/uL — ABNORMAL HIGH (ref 4.0–10.5)

## 2014-02-19 LAB — HEPATITIS B SURFACE ANTIGEN: Hepatitis B Surface Ag: NEGATIVE

## 2014-02-19 LAB — GLUCOSE, CAPILLARY
GLUCOSE-CAPILLARY: 90 mg/dL (ref 70–99)
GLUCOSE-CAPILLARY: 75 mg/dL (ref 70–99)
GLUCOSE-CAPILLARY: 78 mg/dL (ref 70–99)
GLUCOSE-CAPILLARY: 80 mg/dL (ref 70–99)
Glucose-Capillary: 99 mg/dL (ref 70–99)
Glucose-Capillary: 102 mg/dL — ABNORMAL HIGH (ref 70–99)

## 2014-02-19 LAB — OB RESULTS CONSOLE HEPATITIS B SURFACE ANTIGEN: Hepatitis B Surface Ag: POSITIVE

## 2014-02-19 LAB — HEPATITIS B SURFACE ANTIBODY,QUALITATIVE: HEP B S AB: POSITIVE — AB

## 2014-02-19 MED ORDER — PHENYLEPHRINE 40 MCG/ML (10ML) SYRINGE FOR IV PUSH (FOR BLOOD PRESSURE SUPPORT): 80.0000 ug | PREFILLED_SYRINGE | INTRAVENOUS | Status: DC | PRN

## 2014-02-19 MED ORDER — OXYTOCIN 40 UNITS IN LACTATED RINGERS INFUSION - SIMPLE MED
1.0000 m[IU]/min | INTRAVENOUS | Status: DC
  Administered 2014-02-19: 2 m[IU]/min via INTRAVENOUS
  Administered 2014-02-19: 4 m[IU]/min via INTRAVENOUS
  Filled 2014-02-19: qty 1000

## 2014-02-19 MED ORDER — INSULIN DETEMIR 100 UNIT/ML ~~LOC~~ SOLN
20.0000 [IU] | Freq: Every day | SUBCUTANEOUS | Status: DC
  Administered 2014-02-19: 20 [IU] via SUBCUTANEOUS
  Filled 2014-02-19: qty 0.2

## 2014-02-19 MED ORDER — ZOLPIDEM TARTRATE 5 MG PO TABS: 5.0000 mg | ORAL_TABLET | Freq: Every evening | ORAL | Status: DC | PRN

## 2014-02-19 MED ORDER — PHENYLEPHRINE 40 MCG/ML (10ML) SYRINGE FOR IV PUSH (FOR BLOOD PRESSURE SUPPORT)
80.0000 ug | PREFILLED_SYRINGE | INTRAVENOUS | Status: DC | PRN
  Filled 2014-02-19: qty 10

## 2014-02-19 MED ORDER — EPHEDRINE 5 MG/ML INJ: 10.0000 mg | INTRAVENOUS | Status: DC | PRN

## 2014-02-19 MED ORDER — NALBUPHINE HCL 10 MG/ML IJ SOLN
10.0000 mg | INTRAMUSCULAR | Status: DC | PRN
  Administered 2014-02-19: 10 mg via INTRAVENOUS
  Filled 2014-02-19: qty 1

## 2014-02-19 MED ORDER — NALBUPHINE HCL 10 MG/ML IJ SOLN
10.0000 mg | INTRAMUSCULAR | Status: DC | PRN
  Administered 2014-02-19: 10 mg via INTRAMUSCULAR
  Filled 2014-02-19: qty 1

## 2014-02-19 MED ORDER — FENTANYL 2.5 MCG/ML BUPIVACAINE 1/10 % EPIDURAL INFUSION (WH - ANES)
14.0000 mL/h | INTRAMUSCULAR | Status: DC | PRN
Start: 2014-02-19 — End: 2014-02-20
  Administered 2014-02-19 – 2014-02-20 (×4): 14 mL/h via EPIDURAL
  Filled 2014-02-19 (×3): qty 125

## 2014-02-19 MED ORDER — DIPHENHYDRAMINE HCL 50 MG/ML IJ SOLN: 12.5000 mg | INTRAMUSCULAR | Status: DC | PRN

## 2014-02-19 MED ORDER — FENTANYL CITRATE 0.05 MG/ML IJ SOLN
50.0000 ug | INTRAMUSCULAR | Status: DC | PRN
  Administered 2014-02-19 (×3): 50 ug via INTRAVENOUS
  Filled 2014-02-19 (×3): qty 2

## 2014-02-19 MED ORDER — PENICILLIN G POTASSIUM 5000000 UNITS IJ SOLR
2.5000 10*6.[IU] | INTRAVENOUS | Status: DC
  Administered 2014-02-19 – 2014-02-20 (×4): 2.5 10*6.[IU] via INTRAVENOUS
  Filled 2014-02-19 (×7): qty 2.5

## 2014-02-19 MED ORDER — TERBUTALINE SULFATE 1 MG/ML IJ SOLN: 0.2500 mg | Freq: Once | INTRAMUSCULAR | Status: AC | PRN

## 2014-02-19 MED ORDER — PENICILLIN G POTASSIUM 5000000 UNITS IJ SOLR
5.0000 10*6.[IU] | Freq: Once | INTRAMUSCULAR | Status: AC
  Administered 2014-02-19: 5 10*6.[IU] via INTRAVENOUS
  Filled 2014-02-19: qty 5

## 2014-02-19 MED ORDER — LIDOCAINE HCL (PF) 1 % IJ SOLN
INTRAMUSCULAR | Status: DC | PRN
  Administered 2014-02-19 (×2): 5 mL

## 2014-02-19 MED ORDER — LACTATED RINGERS IV SOLN
500.0000 mL | Freq: Once | INTRAVENOUS | Status: AC
  Administered 2014-02-19: 500 mL via INTRAVENOUS

## 2014-02-19 NOTE — Anesthesia Preprocedure Evaluation (Signed)
Anesthesia Evaluation  Patient identified by MRN, date of birth, ID band Patient awake    Reviewed: Allergy & Precautions, H&P , Patient's Chart, lab work & pertinent test results  Airway Mallampati: II TM Distance: >3 FB Neck ROM: full    Dental   Pulmonary former smoker,  breath sounds clear to auscultation        Cardiovascular hypertension, Rhythm:regular Rate:Normal     Neuro/Psych PSYCHIATRIC DISORDERS    GI/Hepatic   Endo/Other  diabetesMorbid obesity  Renal/GU      Musculoskeletal   Abdominal   Peds  Hematology   Anesthesia Other Findings   Reproductive/Obstetrics (+) Pregnancy                           Anesthesia Physical Anesthesia Plan  ASA: III  Anesthesia Plan: Epidural   Post-op Pain Management:    Induction:   Airway Management Planned:   Additional Equipment:   Intra-op Plan:   Post-operative Plan:   Informed Consent: I have reviewed the patients History and Physical, chart, labs and discussed the procedure including the risks, benefits and alternatives for the proposed anesthesia with the patient or authorized representative who has indicated his/her understanding and acceptance.     Plan Discussed with:   Anesthesia Plan Comments:         Anesthesia Quick Evaluation

## 2014-02-19 NOTE — Progress Notes (Signed)
Joyce Green is a 28 y.o. G1P0 at [redacted]w[redacted]d  Subjective: Comfortable with epid  Objective: BP 157/100  Pulse 87  Temp(Src) 98.3 F (36.8 C) (Oral)  Resp 20  Ht  (1.778 m)  Wt 113.853 kg (251 lb)  BMI 36.01 kg/m2  SpO2 100%  LMP 05/21/2013      FHT:  FHR: 120s bpm, variability: moderate,  accelerations:  Present,  decelerations:  Absent UC:   irreg SVE:   Dilation: 2 Effacement (%): 90 Station: -2 Exam by:: Erline Hau RNC  Labs: Lab Results  Component Value Date   WBC 13.2* 02/19/2014   HGB 11.9* 02/19/2014   HCT 34.9* 02/19/2014   MCV 79.9 02/19/2014   PLT 174 02/19/2014    Assessment / Plan: IUP@39 .1wks GHTN Type II DM IOL process  Will begin with Pitocin to achieve reg ctx Cam Hai CNM 02/19/2014, 1:36 PM

## 2014-02-19 NOTE — Progress Notes (Signed)
Joyce Green is a 28 y.o. G1P0 at [redacted]w[redacted]d by LMP c/w 18wk sono admitted for induction of labor due to gestational HTN and Class B DM.  Subjective:  Pt reports feeling more contractions now. She has just received fentanyl.  Objective: BP 131/90  Pulse 102  Temp(Src) 98.4 F (36.9 C) (Oral)  Resp 18  Ht  (1.778 m)  Wt 113.853 kg (251 lb)  BMI 36.01 kg/m2  LMP 05/21/2013      FHT:  FHR: 135 bpm, variability: moderate,  accelerations:  Present,  decelerations:  Absent UC:   Irregular, with underlying irritability SVE:   Dilation: 1 Effacement (%): 50 Station: -1 Exam by:: Dr Karie Mainland  Labs: Lab Results  Component Value Date   WBC 8.5 02/17/2014   HGB 11.2* 02/17/2014   HCT 32.6* 02/17/2014   MCV 79.9 02/17/2014   PLT 182 02/17/2014    Assessment / Plan:  Induction of labor due to GHTN and Class B DM. Currently undergoing cervical ripening.   Labor: Not in active labor. Continue cervical ripening with 8th dose of cytotec  Preeclampsia: no signs or symptoms of toxicity  Fetal Wellbeing: Category I  Pain Control: Labor support without medications  I/D: Will start PCN when induction of labor commences  Anticipated MOD: NSVD  Tomma Rakers, MD, PGY3 02/19/2014, 6:52 AM

## 2014-02-19 NOTE — Progress Notes (Addendum)
Joyce Green is a 28 y.o. G1P0 at [redacted]w[redacted]d by LMP c/w 18wk sono admitted for induction of labor due to gestational HTN and Class B DM.   Subjective:  Noted recurrent late decels. RN checked pt and noted to be complete with bulging bag. Pt with reports of pressure.  Objective: BP 140/103  Pulse 108  Temp(Src) 97.6 F (36.4 C) (Oral)  Resp 18  Ht  (1.778 m)  Wt 113.853 kg (251 lb)  BMI 36.01 kg/m2  SpO2 100%  LMP 05/21/2013      FHT:  FHR: 135 bpm, variability: moderate,  accelerations:  Abscent,  decelerations:  Present recurrent late UC:   regular, every 3-4 minutes SVE:   Dilation: 10 Effacement (%): 100 Station: 0 Exam by:: Dr. Karie Mainland AROM clear fluid Labs: Lab Results  Component Value Date   WBC 13.2* 02/19/2014   HGB 11.9* 02/19/2014   HCT 34.9* 02/19/2014   MCV 79.9 02/19/2014   PLT 174 02/19/2014    Assessment / Plan:  Induction of labor due to GHTN and Class B DM.  Labor: Continue induction with pitocin.  Preeclampsia: no signs or symptoms of toxicity  Fetal Wellbeing: Category II  Pain Control: Epidural I/D: PCN Anticipated MOD: NSVD  Christianne Borrow A 02/19/2014, 9:11 PM

## 2014-02-19 NOTE — Plan of Care (Signed)
Discussed POC with pt and FOB.  Pt feels that current IV med is not working, would like alternative, but first wants to take break from monitor and take a shower.  Pt doing effleurage and breathing, hip rocking while standing.  Questions and concerns voiced and addressed.  Both express understanding and agreement.  EFM d/c with safety instructions- FOB in BR with pt during shower.

## 2014-02-19 NOTE — Anesthesia Procedure Notes (Signed)
Epidural Patient location during procedure: OB Start time: 02/19/2014 12:33 PM  Staffing Anesthesiologist: Brayton Caves Performed by: anesthesiologist   Preanesthetic Checklist Completed: patient identified, site marked, surgical consent, pre-op evaluation, timeout performed, IV checked, risks and benefits discussed and monitors and equipment checked  Epidural Patient position: sitting Prep: site prepped and draped and DuraPrep Patient monitoring: continuous pulse ox and blood pressure Approach: midline Location: L3-L4 Injection technique: LOR air  Needle:  Needle type: Tuohy  Needle gauge: 17 G Needle length: 9 cm and 9 Needle insertion depth: 8 cm Catheter type: closed end flexible Catheter size: 19 Gauge Catheter at skin depth: 14 cm Test dose: negative  Assessment Events: blood not aspirated, injection not painful, no injection resistance, negative IV test and no paresthesia  Additional Notes Patient identified.  Risk benefits discussed including failed block, incomplete pain control, headache, nerve damage, paralysis, blood pressure changes, nausea, vomiting, reactions to medication both toxic or allergic, and postpartum back pain.  Patient expressed understanding and wished to proceed.  All questions were answered.  Sterile technique used throughout procedure and epidural site dressed with sterile barrier dressing. No paresthesia or other complications noted.The patient did not experience any signs of intravascular injection such as tinnitus or metallic taste in mouth nor signs of intrathecal spread such as rapid motor block. Please see nursing notes for vital signs.

## 2014-02-19 NOTE — Progress Notes (Signed)
Joyce Green is a 28 y.o. G1P0 at [redacted]w[redacted]d by LMP c/w 18wk sono admitted for induction of labor due to gestational HTN and Class B DM  Subjective: Reports feeling more pressure and tightness in abdomen. Otherwise more comfortable appearing for the most part. Objective: BP 156/92  Pulse 91  Temp(Src) 98.1 F (36.7 C) (Oral)  Resp 18  Ht  (1.778 m)  Wt 113.853 kg (251 lb)  BMI 36.01 kg/m2  LMP 05/21/2013      FHT:  FHR: 135 bpm, variability: moderate,  accelerations:  Present,  decelerations:  Absent UC:   none SVE:   1/50/-3  Labs: Lab Results  Component Value Date   WBC 8.5 02/17/2014   HGB 11.2* 02/17/2014   HCT 32.6* 02/17/2014   MCV 79.9 02/17/2014   PLT 182 02/17/2014    Assessment / Plan: Induction of labor due to GHTN and Class B DM. Currently undergoing cervical ripening.  Labor: Not in active labor. Continue cervical ripening with 7th dose of cytotec Preeclampsia:  no signs or symptoms of toxicity Fetal Wellbeing:  Category I Pain Control:  Labor support without medications I/D:  Will start PCN when induction of labor commences Anticipated MOD:  NSVD  Tomma Rakers, MD, PGY3 02/19/2014, 1:45 AM

## 2014-02-19 NOTE — Progress Notes (Signed)
Central nursery notified of pt's + Hep B status.  Philipp Deputy, CNM notified as well.

## 2014-02-20 ENCOUNTER — Encounter (HOSPITAL_COMMUNITY): Payer: Self-pay | Admitting: Anesthesiology

## 2014-02-20 ENCOUNTER — Encounter (HOSPITAL_COMMUNITY)
Admission: AD | Disposition: A | Discharge status: Home / Self Care | Payer: Self-pay | Source: Ambulatory Visit | Attending: Obstetrics & Gynecology

## 2014-02-20 DIAGNOSIS — O34219 Maternal care for unspecified type scar from previous cesarean delivery: Secondary | ICD-10-CM

## 2014-02-20 DIAGNOSIS — O1002 Pre-existing essential hypertension complicating childbirth: Secondary | ICD-10-CM

## 2014-02-20 DIAGNOSIS — O24919 Unspecified diabetes mellitus in pregnancy, unspecified trimester: Secondary | ICD-10-CM

## 2014-02-20 LAB — GLUCOSE, CAPILLARY
GLUCOSE-CAPILLARY: 113 mg/dL — AB (ref 70–99)
Glucose-Capillary: 118 mg/dL — ABNORMAL HIGH (ref 70–99)
Glucose-Capillary: 119 mg/dL — ABNORMAL HIGH (ref 70–99)
Glucose-Capillary: 108 mg/dL — ABNORMAL HIGH (ref 70–99)
Glucose-Capillary: 88 mg/dL (ref 70–99)

## 2014-02-20 LAB — CBC
HCT: 31.7 % — ABNORMAL LOW (ref 36.0–46.0)
Hemoglobin: 10.7 g/dL — ABNORMAL LOW (ref 12.0–15.0)
MCH: 27.1 pg (ref 26.0–34.0)
MCHC: 33.8 g/dL (ref 30.0–36.0)
MCV: 80.3 fL (ref 78.0–100.0)
PLATELETS: 166 10*3/uL (ref 150–400)
RBC: 3.95 MIL/uL (ref 3.87–5.11)
RDW: 14.4 % (ref 11.5–15.5)
WBC: 20 10*3/uL — AB (ref 4.0–10.5)

## 2014-02-20 SURGERY — Surgical Case
Anesthesia: Epidural | Site: Abdomen

## 2014-02-20 MED ORDER — SODIUM BICARBONATE 8.4 % IV SOLN
INTRAVENOUS | Status: DC | PRN
  Administered 2014-02-20: 10 mL via EPIDURAL
  Administered 2014-02-20: 5 mL via EPIDURAL

## 2014-02-20 MED ORDER — ONDANSETRON HCL 4 MG/2ML IJ SOLN
INTRAMUSCULAR | Status: AC
  Filled 2014-02-20: qty 2

## 2014-02-20 MED ORDER — BUPIVACAINE LIPOSOME 1.3 % IJ SUSP
INTRAMUSCULAR | Status: DC | PRN
  Administered 2014-02-20: 266 mg

## 2014-02-20 MED ORDER — ONDANSETRON HCL 4 MG/2ML IJ SOLN: 4.0000 mg | INTRAMUSCULAR | Status: DC | PRN

## 2014-02-20 MED ORDER — ZOLPIDEM TARTRATE 5 MG PO TABS: 5.0000 mg | ORAL_TABLET | Freq: Every evening | ORAL | Status: DC | PRN

## 2014-02-20 MED ORDER — OXYTOCIN 10 UNIT/ML IJ SOLN
40.0000 [IU] | INTRAVENOUS | Status: DC | PRN
  Administered 2014-02-20: 40 [IU] via INTRAVENOUS

## 2014-02-20 MED ORDER — DIPHENHYDRAMINE HCL 50 MG/ML IJ SOLN: 25.0000 mg | INTRAMUSCULAR | Status: DC | PRN

## 2014-02-20 MED ORDER — KETOROLAC TROMETHAMINE 30 MG/ML IJ SOLN: 30.0000 mg | Freq: Four times a day (QID) | INTRAMUSCULAR | Status: AC | PRN

## 2014-02-20 MED ORDER — DIPHENHYDRAMINE HCL 50 MG/ML IJ SOLN: 12.5000 mg | INTRAMUSCULAR | Status: DC | PRN

## 2014-02-20 MED ORDER — LANOLIN HYDROUS EX OINT: 1.0000 "application " | TOPICAL_OINTMENT | CUTANEOUS | Status: DC | PRN

## 2014-02-20 MED ORDER — ONDANSETRON HCL 4 MG/2ML IJ SOLN
INTRAMUSCULAR | Status: DC | PRN
  Administered 2014-02-20: 4 mg via INTRAVENOUS

## 2014-02-20 MED ORDER — MORPHINE SULFATE (PF) 0.5 MG/ML IJ SOLN
INTRAMUSCULAR | Status: DC | PRN
  Administered 2014-02-20: 1.5 ug via INTRAVENOUS
  Administered 2014-02-20: 3.5 ug via EPIDURAL

## 2014-02-20 MED ORDER — DIPHENHYDRAMINE HCL 25 MG PO CAPS
25.0000 mg | ORAL_CAPSULE | ORAL | Status: DC | PRN
  Administered 2014-02-20 – 2014-02-21 (×2): 25 mg via ORAL
  Filled 2014-02-20 (×2): qty 1

## 2014-02-20 MED ORDER — ONDANSETRON HCL 4 MG PO TABS: 4.0000 mg | ORAL_TABLET | ORAL | Status: DC | PRN

## 2014-02-20 MED ORDER — SCOPOLAMINE 1 MG/3DAYS TD PT72
1.0000 | MEDICATED_PATCH | Freq: Once | TRANSDERMAL | Status: AC
  Administered 2014-02-20: 1.5 mg via TRANSDERMAL

## 2014-02-20 MED ORDER — DIPHENHYDRAMINE HCL 25 MG PO CAPS
25.0000 mg | ORAL_CAPSULE | Freq: Four times a day (QID) | ORAL | Status: DC | PRN
  Administered 2014-02-20: 25 mg via ORAL
  Filled 2014-02-20 (×2): qty 1

## 2014-02-20 MED ORDER — MORPHINE SULFATE 0.5 MG/ML IJ SOLN
INTRAMUSCULAR | Status: AC
  Filled 2014-02-20: qty 10

## 2014-02-20 MED ORDER — OXYTOCIN 10 UNIT/ML IJ SOLN
INTRAMUSCULAR | Status: AC
  Filled 2014-02-20: qty 4

## 2014-02-20 MED ORDER — OXYTOCIN 40 UNITS IN LACTATED RINGERS INFUSION - SIMPLE MED: 62.5000 mL/h | INTRAVENOUS | Status: AC

## 2014-02-20 MED ORDER — IBUPROFEN 600 MG PO TABS
600.0000 mg | ORAL_TABLET | Freq: Four times a day (QID) | ORAL | Status: DC
  Administered 2014-02-20 – 2014-02-24 (×15): 600 mg via ORAL
  Filled 2014-02-20 (×16): qty 1

## 2014-02-20 MED ORDER — METOCLOPRAMIDE HCL 5 MG/ML IJ SOLN: 10.0000 mg | Freq: Three times a day (TID) | INTRAMUSCULAR | Status: DC | PRN

## 2014-02-20 MED ORDER — INSULIN DETEMIR 100 UNIT/ML ~~LOC~~ SOLN
25.0000 [IU] | Freq: Every day | SUBCUTANEOUS | Status: DC
  Administered 2014-02-20 – 2014-02-21 (×2): 25 [IU] via SUBCUTANEOUS
  Filled 2014-02-20 (×5): qty 0.25

## 2014-02-20 MED ORDER — PRENATAL MULTIVITAMIN CH
1.0000 | ORAL_TABLET | Freq: Every day | ORAL | Status: DC
  Administered 2014-02-20 – 2014-02-23 (×4): 1 via ORAL
  Filled 2014-02-20 (×4): qty 1

## 2014-02-20 MED ORDER — BUPIVACAINE LIPOSOME 1.3 % IJ SUSP
266.0000 mg | Freq: Once | INTRAMUSCULAR | Status: DC
  Filled 2014-02-20: qty 20

## 2014-02-20 MED ORDER — NALOXONE HCL 1 MG/ML IJ SOLN
1.0000 ug/kg/h | INTRAMUSCULAR | Status: DC | PRN
  Filled 2014-02-20: qty 2

## 2014-02-20 MED ORDER — SIMETHICONE 80 MG PO CHEW
80.0000 mg | CHEWABLE_TABLET | ORAL | Status: DC
  Administered 2014-02-20 – 2014-02-24 (×5): 80 mg via ORAL
  Filled 2014-02-20 (×4): qty 1

## 2014-02-20 MED ORDER — KETOROLAC TROMETHAMINE 30 MG/ML IJ SOLN
30.0000 mg | Freq: Four times a day (QID) | INTRAMUSCULAR | Status: AC | PRN
  Administered 2014-02-20: 30 mg via INTRAVENOUS

## 2014-02-20 MED ORDER — SODIUM CHLORIDE 0.9 % IJ SOLN: 3.0000 mL | INTRAMUSCULAR | Status: DC | PRN

## 2014-02-20 MED ORDER — DIBUCAINE 1 % RE OINT: 1.0000 "application " | TOPICAL_OINTMENT | RECTAL | Status: DC | PRN

## 2014-02-20 MED ORDER — SIMETHICONE 80 MG PO CHEW: 80.0000 mg | CHEWABLE_TABLET | ORAL | Status: DC | PRN

## 2014-02-20 MED ORDER — CEFAZOLIN SODIUM-DEXTROSE 2-3 GM-% IV SOLR
INTRAVENOUS | Status: DC | PRN
  Administered 2014-02-20: 2 g via INTRAVENOUS

## 2014-02-20 MED ORDER — LABETALOL HCL 5 MG/ML IV SOLN: 10.0000 mg | INTRAVENOUS | Status: DC | PRN

## 2014-02-20 MED ORDER — ONDANSETRON HCL 4 MG/2ML IJ SOLN: 4.0000 mg | Freq: Three times a day (TID) | INTRAMUSCULAR | Status: DC | PRN

## 2014-02-20 MED ORDER — ASPIRIN EC 81 MG PO TBEC
81.0000 mg | DELAYED_RELEASE_TABLET | Freq: Every day | ORAL | Status: DC
  Administered 2014-02-20 – 2014-02-24 (×5): 81 mg via ORAL
  Filled 2014-02-20 (×7): qty 1

## 2014-02-20 MED ORDER — NALBUPHINE HCL 10 MG/ML IJ SOLN: 5.0000 mg | INTRAMUSCULAR | Status: DC | PRN

## 2014-02-20 MED ORDER — LACTATED RINGERS IV SOLN: INTRAVENOUS | Status: DC

## 2014-02-20 MED ORDER — KETOROLAC TROMETHAMINE 30 MG/ML IJ SOLN
INTRAMUSCULAR | Status: AC
  Filled 2014-02-20: qty 1

## 2014-02-20 MED ORDER — MENTHOL 3 MG MT LOZG: 1.0000 | LOZENGE | OROMUCOSAL | Status: DC | PRN

## 2014-02-20 MED ORDER — SCOPOLAMINE 1 MG/3DAYS TD PT72
MEDICATED_PATCH | TRANSDERMAL | Status: AC
  Filled 2014-02-20: qty 1

## 2014-02-20 MED ORDER — SIMETHICONE 80 MG PO CHEW
80.0000 mg | CHEWABLE_TABLET | Freq: Three times a day (TID) | ORAL | Status: DC
  Administered 2014-02-20 – 2014-02-24 (×9): 80 mg via ORAL
  Filled 2014-02-20 (×11): qty 1

## 2014-02-20 MED ORDER — OXYCODONE-ACETAMINOPHEN 5-325 MG PO TABS
1.0000 | ORAL_TABLET | ORAL | Status: DC | PRN
  Administered 2014-02-22 (×2): 1 via ORAL
  Filled 2014-02-20 (×2): qty 1

## 2014-02-20 MED ORDER — TETANUS-DIPHTH-ACELL PERTUSSIS 5-2.5-18.5 LF-MCG/0.5 IM SUSP
0.5000 mL | Freq: Once | INTRAMUSCULAR | Status: DC
  Filled 2014-02-20: qty 0.5

## 2014-02-20 MED ORDER — FENTANYL CITRATE 0.05 MG/ML IJ SOLN
25.0000 ug | INTRAMUSCULAR | Status: DC | PRN
  Administered 2014-02-20 (×2): 50 ug via INTRAVENOUS

## 2014-02-20 MED ORDER — SENNOSIDES-DOCUSATE SODIUM 8.6-50 MG PO TABS
2.0000 | ORAL_TABLET | ORAL | Status: DC
  Administered 2014-02-20 – 2014-02-24 (×4): 2 via ORAL
  Filled 2014-02-20 (×4): qty 2

## 2014-02-20 MED ORDER — OXYCODONE-ACETAMINOPHEN 5-325 MG PO TABS
2.0000 | ORAL_TABLET | ORAL | Status: DC | PRN
  Administered 2014-02-21 – 2014-02-22 (×3): 2 via ORAL
  Administered 2014-02-22: 1 via ORAL
  Administered 2014-02-22 – 2014-02-24 (×5): 2 via ORAL
  Filled 2014-02-20 (×9): qty 2

## 2014-02-20 MED ORDER — LACTATED RINGERS IV SOLN
INTRAVENOUS | Status: DC
  Administered 2014-02-20 (×2): via INTRAUTERINE

## 2014-02-20 MED ORDER — LIDOCAINE-EPINEPHRINE (PF) 2 %-1:200000 IJ SOLN
INTRAMUSCULAR | Status: AC
  Filled 2014-02-20: qty 20

## 2014-02-20 MED ORDER — NALOXONE HCL 0.4 MG/ML IJ SOLN: 0.4000 mg | INTRAMUSCULAR | Status: DC | PRN

## 2014-02-20 MED ORDER — MEPERIDINE HCL 25 MG/ML IJ SOLN: 6.2500 mg | INTRAMUSCULAR | Status: DC | PRN

## 2014-02-20 MED ORDER — METFORMIN HCL 500 MG PO TABS
1000.0000 mg | ORAL_TABLET | Freq: Two times a day (BID) | ORAL | Status: DC
  Administered 2014-02-20 – 2014-02-23 (×8): 1000 mg via ORAL
  Filled 2014-02-20 (×14): qty 2

## 2014-02-20 MED ORDER — WITCH HAZEL-GLYCERIN EX PADS: 1.0000 "application " | MEDICATED_PAD | CUTANEOUS | Status: DC | PRN

## 2014-02-20 MED ORDER — FENTANYL CITRATE 0.05 MG/ML IJ SOLN
INTRAMUSCULAR | Status: AC
  Filled 2014-02-20: qty 2

## 2014-02-20 SURGICAL SUPPLY — 37 items
BLADE SURG 10 STRL SS (BLADE) ×4 IMPLANT
CLAMP CORD UMBIL (MISCELLANEOUS) IMPLANT
CLOTH BEACON ORANGE TIMEOUT ST (SAFETY) ×2 IMPLANT
DERMABOND ADVANCED (GAUZE/BANDAGES/DRESSINGS) ×2
DERMABOND ADVANCED .7 DNX12 (GAUZE/BANDAGES/DRESSINGS) ×2 IMPLANT
DRAPE LG THREE QUARTER DISP (DRAPES) IMPLANT
DRSG OPSITE POSTOP 4X10 (GAUZE/BANDAGES/DRESSINGS) ×2 IMPLANT
DURAPREP 26ML APPLICATOR (WOUND CARE) ×4 IMPLANT
ELECT REM PT RETURN 9FT ADLT (ELECTROSURGICAL) ×2
ELECTRODE REM PT RTRN 9FT ADLT (ELECTROSURGICAL) ×1 IMPLANT
EXTRACTOR VACUUM BELL STYLE (SUCTIONS) IMPLANT
GLOVE BIOGEL PI IND STRL 8 (GLOVE) ×1 IMPLANT
GLOVE BIOGEL PI INDICATOR 8 (GLOVE) ×1
GLOVE ECLIPSE 8.0 STRL XLNG CF (GLOVE) ×2 IMPLANT
GOWN STRL REUS W/TWL LRG LVL3 (GOWN DISPOSABLE) ×4 IMPLANT
KIT ABG SYR 3ML LUER SLIP (SYRINGE) ×2 IMPLANT
NEEDLE HYPO 18GX1.5 BLUNT FILL (NEEDLE) ×2 IMPLANT
NEEDLE HYPO 22GX1.5 SAFETY (NEEDLE) ×2 IMPLANT
NEEDLE HYPO 25X5/8 SAFETYGLIDE (NEEDLE) ×2 IMPLANT
NS IRRIG 1000ML POUR BTL (IV SOLUTION) ×2 IMPLANT
PACK C SECTION WH (CUSTOM PROCEDURE TRAY) ×2 IMPLANT
PAD OB MATERNITY 4.3X12.25 (PERSONAL CARE ITEMS) ×2 IMPLANT
RTRCTR C-SECT PINK 25CM LRG (MISCELLANEOUS) IMPLANT
STAPLER VISISTAT 35W (STAPLE) IMPLANT
SUT CHROMIC 0 CT 1 (SUTURE) ×2 IMPLANT
SUT MNCRL 0 VIOLET CTX 36 (SUTURE) ×2 IMPLANT
SUT MONOCRYL 0 CTX 36 (SUTURE) ×2
SUT PLAIN 2 0 (SUTURE)
SUT PLAIN 2 0 XLH (SUTURE) IMPLANT
SUT PLAIN ABS 2-0 CT1 27XMFL (SUTURE) IMPLANT
SUT VIC AB 0 CTX 36 (SUTURE) ×1
SUT VIC AB 0 CTX36XBRD ANBCTRL (SUTURE) ×1 IMPLANT
SUT VIC AB 4-0 KS 27 (SUTURE) IMPLANT
SYRINGE 20CC LL (MISCELLANEOUS) ×4 IMPLANT
TOWEL OR 17X24 6PK STRL BLUE (TOWEL DISPOSABLE) ×2 IMPLANT
TRAY FOLEY CATH 14FR (SET/KITS/TRAYS/PACK) IMPLANT
WATER STERILE IRR 1000ML POUR (IV SOLUTION) ×2 IMPLANT

## 2014-02-20 NOTE — Addendum Note (Signed)
Addendum created 02/20/14 1528 by Renford Dills, CRNA   Modules edited: Notes Section   Notes Section:  File: [id number]

## 2014-02-20 NOTE — Transfer of Care (Signed)
Immediate Anesthesia Transfer of Care Note  Patient: Joyce Green  Procedure(s) Performed: Procedure(s): CESAREAN SECTION (N/A)  Patient Location: PACU  Anesthesia Type:Epidural  Level of Consciousness: awake, alert  and oriented  Airway & Oxygen Therapy: Patient Spontanous Breathing  Post-op Assessment: Report given to PACU RN and Post -op Vital signs reviewed and stable  Post vital signs: Reviewed and stable  Complications: No apparent anesthesia complications

## 2014-02-20 NOTE — Anesthesia Postprocedure Evaluation (Signed)
  Anesthesia Post-op Note  Patient: Joyce Green  Procedure(s) Performed: Procedure(s): CESAREAN SECTION (N/A)  Patient Location: PACU  Anesthesia Type:Epidural  Level of Consciousness: awake and alert   Airway and Oxygen Therapy: Patient Spontanous Breathing  Post-op Pain: mild  Post-op Assessment: Post-op Vital signs reviewed, Patient's Cardiovascular Status Stable, Respiratory Function Stable, Patent Airway, No signs of Nausea or vomiting and Pain level controlled  Post-op Vital Signs: Reviewed and stable  Last Vitals:  Filed Vitals:   02/20/14 0645  BP: 152/95  Pulse: 99  Temp:   Resp: 16    Complications: No apparent anesthesia complications

## 2014-02-20 NOTE — Anesthesia Postprocedure Evaluation (Signed)
  Anesthesia Post-op Note  Patient: Joyce Green  Procedure(s) Performed: Procedure(s): CESAREAN SECTION (N/A)  Patient Location: [facility] Unit  Anesthesia Type:Epidural  Level of Consciousness: awake  Airway and Oxygen Therapy: Patient Spontanous Breathing  Post-op Pain: mild  Post-op Assessment: Patient's Cardiovascular Status Stable and Respiratory Function Stable  Post-op Vital Signs: stable  Last Vitals:  Filed Vitals:   02/20/14 1351  BP: 152/95  Pulse: 100  Temp: 36.8 C  Resp: 16    Complications: No apparent anesthesia complications

## 2014-02-20 NOTE — Op Note (Signed)
Preoperative diagnosis:  1.  Intrauterine pregnancy at [redacted]w[redacted]d  weeks gestation                                         2.  Class B diabetes                                         3.  Gestational hypertension                                         4.  Fetal intolerance of labor                                         5.  Secondary arrest of labor   Postoperative diagnosis:  Same as above plus   Procedure:  Primary cesarean section  Surgeon:  Lazaro Arms MD  Assistant:    Anesthesia: Spinal  Findings:  .    Over a low transverse incision was delivered a viable female with Apgars of 1 and 3 and 4, cord pH 7.02 weighing pending lbs.  oz. Uterus, tubes and ovaries were all normal.  There were no other significant findings  Description of operation:  Patient was taken to the operating room and placed in the sitting position where she underwent a spinal anesthetic. She was then placed in the supine position with tilt to the left side. When adequate anesthetic level was obtained she was prepped and draped in usual sterile fashion and a Foley catheter was placed. A Pfannenstiel skin incision was made and carried down sharply to the rectus fascia which was scored in the midline extended laterally. The fascia was taken off the muscles both superiorly and without difficulty. The muscles were divided.  The peritoneal cavity was entered.  Bladder blade was placed, no bladder flap was created.  A low transverse hysterotomy incision was made and delivered a viable female  infant at 50 with Apgars of 1 and 3 and 4weighingpending lbs  oz.  Cord pH was obtained and was 7.02. The uterus was exteriorized. It was closed in 2 layers, the first being a running interlocking layer and the second being an imbricating layer using 0 monocryl on a CTX needle. There was good resulting hemostasis. The uterus tubes and ovaries were all normal. Peritoneal cavity was irrigated vigorously. The muscles and peritoneum were  reapproximated loosely. The fascia was closed using 0 Vicryl in running fashion. Subcutaneous tissue was made hemostatic and irrigated. The skin was closed using 4-0 Vicryl on a Keith needle in a subcuticular fashion.  Dermabond was placed for additional wound integrity and to serve as a barrier. Blood loss for the procedure was 800 cc. The patient received a gram of Ancef prophylactically. The patient was taken to the recovery room in good stable condition with all counts being correct x3.  EBL 800 cc  Declyn Delsol H 02/20/2014 4:58 AM

## 2014-02-20 NOTE — Lactation Note (Signed)
This note was copied from the chart of Joyce Benard Halsted. Lactation Consultation Note  Patient Name: Joyce Green [id number] Date: 02/20/2014 Reason for consult: Initial assessment NICU baby 11 hours of life. Mom states that she has already used DEBP once. Enc mom to massage breasts and hand express, then use DEBP, followed by hand expression every 3 hours. Mom return-demonstrated hand expression with just a drop of colostrum. Reviewed NICU booklet with mom and enc to read. Discussed DEBP for home and enc mom to call insurance company, mom states she will be calling WIC as well. Discussed with mom about hospital rental/loaner if needed as well. Mom aware of OP/BFSG and LC phone line services. Enc mom to call for assistance with using pump as needed.   Maternal Data Has patient been taught Hand Expression?: Yes Does the patient have breastfeeding experience prior to this delivery?: No  Feeding    LATCH Score/Interventions                      Lactation Tools Discussed/Used Pump Review: Setup, frequency, and cleaning;Milk Storage Date initiated:: 02/20/14   Consult Status Consult Status: Follow-up Date: 02/21/14 Follow-up type: In-patient    Geralynn Ochs 02/20/2014, 3:55 PM

## 2014-02-20 NOTE — Progress Notes (Signed)
  Joyce Green is a 28 y.o. G1P0 at [redacted]w[redacted]d by LMP c/w 18wk sono admitted for induction of labor due to gestational HTN and Class B DM.     Subjective: Pt sitting upright in bed, comfortable.RN notified us of recurrent variables.  Objective: BP 153/96  Pulse 109  Temp(Src) 97.9 F (36.6 C) (Oral)  Resp 16  Ht  (1.778 m)  Wt 113.853 kg (251 lb)  BMI 36.01 kg/m2  SpO2 100%  LMP 05/21/2013 I/O last 3 completed shifts: In: -  Out: 250 [Emesis/NG output:250] Total I/O In: -  Out: 1000 [Urine:1000]   FHT:  FHR: 145 bpm, variability: moderate,  accelerations:  Present,  decelerations:  Present variable, recurrent UC:   regular, every 3-4 minutes SVE:   7-8/100/0 Labs: Lab Results  Component Value Date   WBC 13.2* 02/19/2014   HGB 11.9* 02/19/2014   HCT 34.9* 02/19/2014   MCV 79.9 02/19/2014   PLT 174 02/19/2014   Assessment/Plan Induction of labor due to GHTN and Class B DM.  Labor: Hold pitocin for now given recurrent variables. Preeclampsia: Last few BPs 150s-160s/90s-100s. No signs or symptoms of toxicity. Will start prn labetalol.  Fetal Wellbeing: Category II, start amnioinfusion due to recurrent variables Pain Control: Epidural  I/D: PCN  Anticipated MOD: NSVD though guarded given FHR intolerance of increasing pitocin. She was verbally consented on possible need for cesarean delivery and provided verbal understanding and agreement to this.   Pt was seen by and discussed with Philipp Deputy, CNM.  Tomma Rakers, MD, PGY3 02/20/2014, 2:05 AM

## 2014-02-21 LAB — GLUCOSE, CAPILLARY
Glucose-Capillary: 99 mg/dL (ref 70–99)
Glucose-Capillary: 105 mg/dL — ABNORMAL HIGH (ref 70–99)
Glucose-Capillary: 107 mg/dL — ABNORMAL HIGH (ref 70–99)

## 2014-02-21 LAB — CBC
HEMATOCRIT: 28.6 % — AB (ref 36.0–46.0)
Hemoglobin: 9.5 g/dL — ABNORMAL LOW (ref 12.0–15.0)
MCH: 26.7 pg (ref 26.0–34.0)
MCHC: 33.2 g/dL (ref 30.0–36.0)
MCV: 80.3 fL (ref 78.0–100.0)
Platelets: 154 10*3/uL (ref 150–400)
RBC: 3.56 MIL/uL — ABNORMAL LOW (ref 3.87–5.11)
RDW: 14.6 % (ref 11.5–15.5)
WBC: 13.8 10*3/uL — ABNORMAL HIGH (ref 4.0–10.5)

## 2014-02-21 NOTE — Progress Notes (Signed)
Subjective: Postpartum Day 1: Cesarean Delivery at [redacted]w[redacted]d weeks gestation, Class B diabetes, CHTN, fetal intolerance of labor and scondary arrest of labor  Patient reports tolerating PO and no problems voiding.  OOB.  No flatus yet.  Breastfeeding, baby is doing well in NICU.  Objective: Vital signs in last 24 hours: Temp:  [97.8 F (36.6 C)-99.5 F (37.5 C)] 98.2 F (36.8 C) (09/13 0558) Pulse Rate:  [92-100] 93 (09/13 0558) Resp:  [16-18] 16 (09/13 0558) BP: (130-155)/(82-101) 133/92 mmHg (09/13 0558) SpO2:  [96 %-100 %] 100 % (09/13 0558)   Recent Labs  02/20/14 1241 02/20/14 2211 02/21/14 0556  GLUCAP 108* 88 99    Physical Exam:  General: alert and no distress Lochia: appropriate Uterine Fundus: firm Incision: healing well, no significant drainage, no significant erythema DVT Evaluation: No evidence of DVT seen on physical exam. Negative Homan's sign.  CBC Latest Ref Rng 02/21/2014 02/20/2014 02/19/2014  WBC 4.0 - 10.5 K/uL 13.8(H) 20.0(H) 13.2(H)  Hemoglobin 12.0 - 15.0 g/dL 1.6(X) 10.7(L) 11.9(L)  Hematocrit 36.0 - 46.0 % 28.6(L) 31.7(L) 34.9(L)  Platelets 150 - 400 K/uL 154 166 174   Assessment/Plan: Status post Cesarean section. Doing well postoperatively.  Stable CBGs, no intervention needed for now Breastfeeding, desires Mirena Continue current care.  Tereso Newcomer, MD 02/21/2014, 7:30 AM

## 2014-02-22 ENCOUNTER — Encounter (HOSPITAL_COMMUNITY): Payer: Self-pay | Admitting: *Deleted

## 2014-02-22 LAB — GLUCOSE, CAPILLARY
GLUCOSE-CAPILLARY: 60 mg/dL — AB (ref 70–99)
GLUCOSE-CAPILLARY: 82 mg/dL (ref 70–99)
GLUCOSE-CAPILLARY: 67 mg/dL — AB (ref 70–99)
Glucose-Capillary: 69 mg/dL — ABNORMAL LOW (ref 70–99)
Glucose-Capillary: 74 mg/dL (ref 70–99)
Glucose-Capillary: 75 mg/dL (ref 70–99)
Glucose-Capillary: 80 mg/dL (ref 70–99)
Glucose-Capillary: 88 mg/dL (ref 70–99)
Glucose-Capillary: 96 mg/dL (ref 70–99)

## 2014-02-22 MED ORDER — INSULIN DETEMIR 100 UNIT/ML ~~LOC~~ SOLN
20.0000 [IU] | Freq: Every day | SUBCUTANEOUS | Status: DC
Start: 2014-02-22 — End: 2014-02-23
  Administered 2014-02-22: 20 [IU] via SUBCUTANEOUS
  Filled 2014-02-22 (×2): qty 0.2

## 2014-02-22 NOTE — Progress Notes (Signed)
Hypoglycemic Event  CBG: 69  Treatment: 15 GM carbohydrate snack  Symptoms: None  Follow-up CBG: Time:1454 CBG Result:75  Possible Reasons for Event: Unknown  Comments/MD notified:resident notified at 1850 after another episode occurred.    Sheryn Bison  Remember to initiate Hypoglycemia Order Set & complete

## 2014-02-22 NOTE — Progress Notes (Signed)
Hypoglycemic Event  CBG: 60  Treatment: 15 GM carbohydrate snack  Symptoms: None  Follow-up CBG: Time:0620 CBG Result: 82  Possible Reasons for Event:  Levemer 25 units at 2400 9/14  Comments/MD notified: Zerita Boers CNM    Clista Bernhardt Arell  Remember to initiate Hypoglycemia Order Set & completeAdult (Non-Pregnant) Hypoglycemia Protocol Treatment Guidelines  1.  RN shall initiate Hypoglycemia Protocol emergency measures immediately when:            w Routine or STAT CBG and/or a lab glucose indicates hypoglycemia (CBG < 70 mg/dl)  2.  Treat the patient according to ability to take PO's and severity of hypoglycemia.   3.  If patient is on GlucoStabilizer, follow directions provided by the [facility] for hypoglycemic events.  4.  If patient on insulin pump, follow Hypoglycemia Protocol.  If patient requires more than one treatment have patient place pump in SUSPEND and notify MD.  DO NOT leave pump in SUSPEND for greater than 30 minutes unless ordered by MD.  A.  Treatment for Mild or Moderate-Patient cooperative and able to swallow    1.  Patient taking PO's and can cooperate   a.  Give one of the following 15 gram CHO options:                           w 1 tube oral dextrose gel                           w 3-4 Glucose tablets                           w 4 oz. Juice                           w 4 oz. regular soda                                    ESRD patients:  clear, regular soda                           w 8 oz. skim milk    b.  Recheck CBG in 15 minutes after treatment                            w If CBG < 70 mg/dl, repeat treatment and recheck until hypoglycemia is resolved                            w If CBG > 70 mg/dl and next meal is more than 1 hour away, give additional 15 grams CHO   2.  Patient NPO-Patient cooperative and no altered mental status    a.  Give 25 ml of D50 IV.   b.  Recheck CBG in 15 minutes after treatment.          w If CBG is less than 70 mg/dl, repeat treatment and recheck until hypoglycemia is resolved.   c.  Notify MD for further orders.             SPECIAL CONSIDERATIONS:    a.  If no IV access,  w Start IV of D5W at [facility]                             w Give 25 ml of D50 IV.    b.  If unable to gain IV access                             w Give Glucagon IM:    i.  1 mg if patient weighs more than 45.5 kg    ii.  0.5 mg if patient weighs less than 45.5 kg   c.  Notify MD for further orders  B.  Treatment for Severe-- Patient unconscious or unable to take PO's safely    1.  Position patient on side   2.  Give 50 ml D50 IV   3.  Recheck CBG in 15 minutes.                    w If CBG is less than 70 mg/dl, repeat treatment and recheck until hypoglycemia is resolved.   4.  Notify MD for further orders.    SPECIAL CONSIDERATIONS:    a.  If no IV access                              w Give Glucagon IM                                        i.  1 mg if patient weighs more than 45.5 kg                                       ii.  0.5 mg if patient weighs less than 45.5 kg                              w Start IV of D5W at 50 ml/hr and give 50 ml D50 IV   b.  If no IV access and active seizure                               w Call Rapid Response   c.  If unable to gain IV access, give Glucagon IM:                              w 1 mg if patient weighs more than 45.5 kg                              w 0.5 mg if patient weighs less than 45.5 kg   d.  Notify MD for further orders.  C.  Complete smart text progress note to document intervention and follow-up CBG   1.  In [facility] patient chart, click on Notes (left side of screen)   2.  Create Progress Note   3.  Click on [employer].  In the Match box type "hypo" and enter    4.  Double click on [facility] IP HYPOGLYCEMIC EVENT and enter data   5.  MD must be notified if patient is NPO or experienced severe  hypoglycemia

## 2014-02-22 NOTE — Progress Notes (Signed)
Hypoglycemic Event  CBG: 67  Treatment: 15 GM carbohydrate snack  Symptoms: None  Follow-up CBG: Time:1851 CBG Result:80  Possible Reasons for Event: Unknown  Comments/MD notified:Resident made aware, ordered to give metformin, but she will adjust levemir dosage for tonight.    Joyce Green  Remember to initiate Hypoglycemia Order Set & complete

## 2014-02-22 NOTE — Progress Notes (Signed)
Ur chart review completed.  

## 2014-02-22 NOTE — Progress Notes (Signed)
Patient seen and examined.  Discussed patient with team.  Agree with above note.  Levie Heritage, DO 02/22/2014 9:11 AM

## 2014-02-22 NOTE — H&P (Signed)
Attestation of Attending Supervision of Advanced Practitioner: Evaluation and management procedures were performed by the PA/NP/CNM/OB Fellow under my supervision/collaboration. Chart reviewed and agree with management and plan.  Tilda Burrow 02/22/2014 4:53 PM  Attestation of Attending Supervision of Advanced Practitioner: Evaluation and management procedures were performed by the PA/NP/CNM/OB Fellow under my supervision/collaboration. Chart reviewed and agree with management and plan.  Jarah Pember V 02/22/2014 4:53 PM

## 2014-02-22 NOTE — Progress Notes (Signed)
Post OP Day 2 LTCS due to NRFHTs Subjective: no complaints, up ad lib, voiding, tolerating PO, + flatus and +BM, Daughter still in NICU but off ventilator  Objective: Blood pressure 145/71, pulse 97, temperature 98.4 F (36.9 C), temperature source Oral, resp. rate 20, height  (1.778 m), weight 113.853 kg (251 lb), last menstrual period 05/21/2013, SpO2 100.00%, unknown if currently breastfeeding.  Physical Exam:  General: alert, cooperative and no distress Lochia: appropriate Uterine Fundus: firm Incision: healing well, no significant drainage, no significant erythema DVT Evaluation: No evidence of DVT seen on physical exam. Negative Homan's sign. No cords or calf tenderness.   Recent Labs  02/20/14 0610 02/21/14 0545  HGB 10.7* 9.5*  HCT 31.7* 28.6*    Assessment/Plan: Plan for discharge tomorrow and Contraception Mirena, Currently pumping as baby is in the NICU, continue metformin and insulin   LOS: 5 days   Joyce Green 02/22/2014, 7:58 AM

## 2014-02-23 LAB — GLUCOSE, CAPILLARY
GLUCOSE-CAPILLARY: 69 mg/dL — AB (ref 70–99)
GLUCOSE-CAPILLARY: 75 mg/dL (ref 70–99)
Glucose-Capillary: 74 mg/dL (ref 70–99)

## 2014-02-23 MED ORDER — INSULIN DETEMIR 100 UNIT/ML ~~LOC~~ SOLN
17.0000 [IU] | Freq: Every day | SUBCUTANEOUS | Status: DC
Start: 2014-02-23 — End: 2014-02-24
  Filled 2014-02-23 (×2): qty 0.17

## 2014-02-23 MED ORDER — FUROSEMIDE 40 MG PO TABS
40.0000 mg | ORAL_TABLET | Freq: Every day | ORAL | Status: DC
  Administered 2014-02-23 – 2014-02-24 (×2): 40 mg via ORAL
  Filled 2014-02-23 (×3): qty 1

## 2014-02-23 MED ORDER — HYDROCHLOROTHIAZIDE 25 MG PO TABS
25.0000 mg | ORAL_TABLET | Freq: Every day | ORAL | Status: DC
  Administered 2014-02-23 – 2014-02-24 (×2): 25 mg via ORAL
  Filled 2014-02-23 (×3): qty 1

## 2014-02-23 NOTE — Progress Notes (Addendum)
Inpatient Diabetes Program Recommendations  AACE/ADA: New Consensus Statement on Inpatient Glycemic Control (2013)  Target Ranges:  Prepandial:   less than 140 mg/dL      Peak postprandial:   less than 180 mg/dL (1-2 hours)      Critically ill patients:  140 - 180 mg/dL   Results for Joyce Green, Joyce Green (MRN [id number]) as of 02/23/2014 08:46  Ref. Range 02/22/2014 11:45 02/22/2014 14:16 02/22/2014 14:54 02/22/2014 18:31 02/22/2014 18:51 02/22/2014 23:44 02/23/2014 05:31  Glucose-Capillary Latest Range: 70-99 mg/dL 74 69 (L) 75 67 (L) 80 96 69 (L)   Diabetes history: DM2 Current orders for Inpatient glycemic control: Levemir 20 units QHS, Metformin 1000 mg BID  Inpatient Diabetes Program Recommendations Insulin - Basal: Noted Levemir was decreased to 20 units and patient received 20 units of Levemir last night. Fasting glucose this morning 69 mg/dl. Please consider decreasing Levemir to 17 units QHS.   Note: Spoke with patient about diabetes and potential for low blood glucose post delivery and with breastfeeding.  Patient reports that she had type 2 diabetes prior to pregnancy and that she was on Levemir 50 units QHS (prior to pregnancy). Discussed impact of being post-delivery and breastfeeding on diabetes control. Discussed how glucose has trending since delivery and inquired about feeling symptomatic with CBGs in the 60's mg/dl. Patient reports that she has not felt any symptoms of low glucose with CBG of 67 or 69 mg/dl. Patient states that to her knowledge she has never experienced any symptoms of hypoglycemia. Reviewed symptoms of hypoglycemia with the patient along with treatment. Encouraged patient to be sure to check her glucose 3-4 times per day and any other time she felt any symptoms of hypoglycemia or hyperglycemia and if she notices that she is running low or high consistently to call her doctor for recommendations for insulin adjustments. In talking with the patient she reports that during  her hospital stay her lancet device was lost so she does not have a lancet device to prick her finger. Unsure if lancet device can be purchased separate from a new glucometer kit. Informed patient I would request MD provide her with a prescription for a new glucometer and testing supplies. Patient verbalized understanding of information discussed and she states that she has no further questions at this time related to diabetes.   Thanks, Barnie Alderman, RN, MSN, CCRN Diabetes Coordinator Inpatient Diabetes Program [phone] (Team Pager) [phone] ([place] office) [phone] [facility] office)    Thanks, Barnie Alderman, RN, MSN, [place] Diabetes Coordinator Inpatient Diabetes Program [phone] (Team Pager) [phone] ([place] office) [phone] [facility] office)

## 2014-02-23 NOTE — Lactation Note (Signed)
This note was copied from the chart of Girl Benard Halsted. Lactation Consultation Note    Follow up consult with this mom and baby, now bak with mom after beng in NICU, and on a bili blanket. Mom is formula feeding, but states she wants to breast feed, but baby not latching. i explained that the baby si probably sleepy from increased bili, but that mom needs to pump every 3 hours to protect her milk suply. Mom said she would resume pumping. She has an appointment for [facility], paper work given to mom for a [facility] loaner DEP  Patient Name: Girl Jonae Renshaw QIONG'E Date: 02/23/2014 Reason for consult: Follow-up assessment   Maternal Data    Feeding    LATCH Score/Interventions                      Lactation Tools Discussed/Used WIC Program: Yes (mom has appointmetn for next week with ?WIC, and will nedd a laoner DEP on discharge 9/16)   Consult Status Consult Status: Follow-up Date: 02/24/14 Follow-up type: In-patient    Alfred Levins 02/23/2014, 6:25 PM

## 2014-02-23 NOTE — Lactation Note (Signed)
This note was copied from the chart of Joyce Benard Halsted. Lactation Consultation Note        Follow up consult with this mom of a term NICU baby, now 77 hours old. Mom is an insulin dependent diabeitic, and I told ehr ti may take her a day or two longer for her milk to transition in. Mom is pumping, but is also fine with the baby getting formula. Mom knows to call for questions/cocnerns.  Patient Name: Joyce Green [id number] Date: 02/23/2014     Maternal Data    Feeding Feeding Type: Breast Milk with Formula added Nipple Type: Regular Length of feed: 20 min  LATCH Score/Interventions                      Lactation Tools Discussed/Used     Consult Status      Alfred Levins 02/23/2014, 4:57 PM

## 2014-02-23 NOTE — Progress Notes (Signed)
Post OP Day 3 LTCS due to NRFHTs Subjective: no complaints, up ad lib, voiding, tolerating PO, + flatus and +BM, Daughter still in NICU but doing well; breast fed yesterday. Denies HAs, blurry vision.  Objective: Blood pressure 153/99, pulse 81, temperature 98 F (36.7 C), temperature source Oral, resp. rate 18, height  (1.778 m), weight 251 lb (113.853 kg), last menstrual period 05/21/2013, SpO2 100.00%, unknown if currently breastfeeding.  Physical Exam:  General: alert, cooperative and no distress Lochia: appropriate Uterine Fundus: firm Incision: healing well, no significant drainage, no significant erythema DVT Evaluation: No evidence of DVT seen on physical exam. Negative Homan's sign. No cords or calf tenderness.2+ LE edema    Recent Labs  02/21/14 0545  HGB 9.5*  HCT 28.6*    Assessment/Plan: Plan for discharge tomorrow due to ongoing stage II HTN (140s-60s/80-100s). Will attempt BP control and diuresis with lasix 40 mg PO q day. No s/sx pre-eclampsia currently. S/p mag x 24 hrs. Good UOP.  Several hypoglycemic episodes, continue metformin and dec levemir from 24 u to 20 u. Contraception Nexplanon, Currently pumping as baby is in the NICU   LOS: 6 days   Elita Boone 02/23/2014, 8:44 AM

## 2014-02-24 LAB — GLUCOSE, CAPILLARY: Glucose-Capillary: 129 mg/dL — ABNORMAL HIGH (ref 70–99)

## 2014-02-24 MED ORDER — ACCU-CHEK FASTCLIX LANCETS MISC
1.0000 [IU] | Freq: Four times a day (QID) | Status: DC
Start: 2014-02-24 — End: 2016-09-21

## 2014-02-24 MED ORDER — ENALAPRIL MALEATE 10 MG PO TABS: 10.0000 mg | ORAL_TABLET | Freq: Every day | ORAL | Status: DC

## 2014-02-24 MED ORDER — INSULIN DETEMIR 100 UNIT/ML ~~LOC~~ SOLN: 10.0000 [IU] | Freq: Every day | SUBCUTANEOUS | Status: DC

## 2014-02-24 MED ORDER — OXYCODONE-ACETAMINOPHEN 5-325 MG PO TABS: 1.0000 | ORAL_TABLET | ORAL | Status: DC | PRN

## 2014-02-24 NOTE — Discharge Instructions (Signed)
Etonogestrel implant °What is this medicine? °ETONOGESTREL (et oh noe JES trel) is a contraceptive (birth control) device. It is used to prevent pregnancy. It can be used for up to 3 years. °This medicine may be used for other purposes; ask your health care provider or pharmacist if you have questions. °COMMON BRAND NAME(S): Implanon, Nexplanon °What should I tell my health care provider before I take this medicine? °They need to know if you have any of these conditions: °-abnormal vaginal bleeding °-blood vessel disease or blood clots °-cancer of the breast, cervix, or liver °-depression °-diabetes °-gallbladder disease °-headaches °-heart disease or recent heart attack °-high blood pressure °-high cholesterol °-kidney disease °-liver disease °-renal disease °-seizures °-tobacco smoker °-an unusual or allergic reaction to etonogestrel, other hormones, anesthetics or antiseptics, medicines, foods, dyes, or preservatives °-pregnant or trying to get pregnant °-breast-feeding °How should I use this medicine? °This device is inserted just under the skin on the inner side of your upper arm by a health care professional. °Talk to your pediatrician regarding the use of this medicine in children. Special care may be needed. °Overdosage: If you think you've taken too much of this medicine contact a poison control center or emergency room at once. °Overdosage: If you think you have taken too much of this medicine contact a poison control center or emergency room at once. °NOTE: This medicine is only for you. Do not share this medicine with others. °What if I miss a dose? °This does not apply. °What may interact with this medicine? °Do not take this medicine with any of the following medications: °-amprenavir °-bosentan °-fosamprenavir °This medicine may also interact with the following medications: °-barbiturate medicines for inducing sleep or treating seizures °-certain medicines for fungal infections like ketoconazole and  itraconazole °-griseofulvin °-medicines to treat seizures like carbamazepine, felbamate, oxcarbazepine, phenytoin, topiramate °-modafinil °-phenylbutazone °-rifampin °-some medicines to treat HIV infection like atazanavir, indinavir, lopinavir, nelfinavir, tipranavir, ritonavir °-St. John's wort °This list may not describe all possible interactions. Give your health care provider a list of all the medicines, herbs, non-prescription drugs, or dietary supplements you use. Also tell them if you smoke, drink alcohol, or use illegal drugs. Some items may interact with your medicine. °What should I watch for while using this medicine? °This product does not protect you against HIV infection (AIDS) or other sexually transmitted diseases. °You should be able to feel the implant by pressing your fingertips over the skin where it was inserted. Tell your doctor if you cannot feel the implant. °What side effects may I notice from receiving this medicine? °Side effects that you should report to your doctor or health care professional as soon as possible: °-allergic reactions like skin rash, itching or hives, swelling of the face, lips, or tongue °-breast lumps °-changes in vision °-confusion, trouble speaking or understanding °-dark urine °-depressed mood °-general ill feeling or flu-like symptoms °-light-colored stools °-loss of appetite, nausea °-right upper belly pain °-severe headaches °-severe pain, swelling, or tenderness in the abdomen °-shortness of breath, chest pain, swelling in a leg °-signs of pregnancy °-sudden numbness or weakness of the face, arm or leg °-trouble walking, dizziness, loss of balance or coordination °-unusual vaginal bleeding, discharge °-unusually weak or tired °-yellowing of the eyes or skin °Side effects that usually do not require medical attention (Report these to your doctor or health care professional if they continue or are bothersome.): °-acne °-breast pain °-changes in  weight °-cough °-fever or chills °-headache °-irregular menstrual bleeding °-itching, burning, and   vaginal discharge -pain or difficulty passing urine -sore throat This list may not describe all possible side effects. Call your doctor for medical advice about side effects. You may report side effects to FDA at 1-800-FDA-1088. Where should I keep my medicine? This drug is given in a hospital or clinic and will not be stored at home. NOTE: This sheet is a summary. It may not cover all possible information. If you have questions about this medicine, talk to your doctor, pharmacist, or health care provider.  2015, Elsevier/Gold Standard. (2011-12-03 15:37:45) Cesarean Delivery, Care After Refer to this sheet in the next few weeks. These instructions provide you with information on caring for yourself after your procedure. Your health care provider may also give you specific instructions. Your treatment has been planned according to current medical practices, but problems sometimes occur. Call your health care provider if you have any problems or questions after you go home. HOME CARE INSTRUCTIONS  Only take over-the-counter or prescription medications as directed by your health care provider.  Do not drink alcohol, especially if you are breastfeeding or taking medication to relieve pain.  Do not chew or smoke tobacco.  Continue to use good perineal care. Good perineal care includes:  Wiping your perineum from front to back.  Keeping your perineum clean.  Check your surgical cut (incision) daily for increased redness, drainage, swelling, or separation of skin.  Clean your incision gently with soap and water every day, and then pat it dry. If your health care provider says it is okay, leave the incision uncovered. Use a bandage (dressing) if the incision is draining fluid or appears irritated. If the adhesive strips across the incision do not fall off within 7 days, carefully peel them  off.  Hug a pillow when coughing or sneezing until your incision is healed. This helps to relieve pain.  Do not use tampons or douche until your health care provider says it is okay.  Shower, wash your hair, and take tub baths as directed by your health care provider.  Wear a well-fitting bra that provides breast support.  Limit wearing support panties or control-top hose.  Drink enough fluids to keep your urine clear or pale yellow.  Eat high-fiber foods such as whole grain cereals and breads, brown rice, beans, and fresh fruits and vegetables every day. These foods may help prevent or relieve constipation.  Resume activities such as climbing stairs, driving, lifting, exercising, or traveling as directed by your health care provider.  Talk to your health care provider about resuming sexual activities. This is dependent upon your risk of infection, your rate of healing, and your comfort and desire to resume sexual activity.  Try to have someone help you with your household activities and your newborn for at least a few days after you leave the hospital.  Rest as much as possible. Try to rest or take a nap when your newborn is sleeping.  Increase your activities gradually.  Keep all of your scheduled postpartum appointments. It is very important to keep your scheduled follow-up appointments. At these appointments, your health care provider will be checking to make sure that you are healing physically and emotionally. SEEK MEDICAL CARE IF:   You are passing large clots from your vagina. Save any clots to show your health care provider.  You have a foul smelling discharge from your vagina.  You have trouble urinating.  You are urinating frequently.  You have pain when you urinate.  You have a change  in your bowel movements.  You have increasing redness, pain, or swelling near your incision.  You have pus draining from your incision.  Your incision is separating.  You have  painful, hard, or reddened breasts.  You have a severe headache.  You have blurred vision or see spots.  You feel sad or depressed.  You have thoughts of hurting yourself or your newborn.  You have questions about your care, the care of your newborn, or medications.  You are dizzy or light-headed.  You have a rash.  You have pain, redness, or swelling at the site of the removed intravenous access (IV) tube.  You have nausea or vomiting.  You stopped breastfeeding and have not had a menstrual period within 12 weeks of stopping.  You are not breastfeeding and have not had a menstrual period within 12 weeks of delivery.  You have a fever. SEEK IMMEDIATE MEDICAL CARE IF:  You have persistent pain.  You have chest pain.  You have shortness of breath.  You faint.  You have leg pain.  You have stomach pain.  Your vaginal bleeding saturates 2 or more sanitary pads in 1 hour. MAKE SURE YOU:   Understand these instructions.  Will watch your condition.  Will get help right away if you are not doing well or get worse. Document Released: 02/17/2002 Document Revised: 10/12/2013 Document Reviewed: 01/23/2012 [facility] Patient Information 2015 [employer], [place]. This information is not intended to replace advice given to you by your health care provider. Make sure you discuss any questions you have with your health care provider.

## 2014-02-24 NOTE — Progress Notes (Signed)
Patient and baby discharged home. Patient given discharge instructions and follow up care. Pt verbalized understanding. Vitals stable, See Chart. Escorted outside.   Rosita Fire RN

## 2014-02-24 NOTE — Discharge Summary (Signed)
Obstetric Discharge Summary Reason for Admission: induction of labor Prenatal Procedures: ultrasound Intrapartum Procedures: cesarean: low cervical, transverse Postpartum Procedures: none and antibiotics Complications-Operative and Postpartum: post partum elevated blood pressures and hypoglycemia Hemoglobin  Date Value Ref Range Status  02/21/2014 9.5* 12.0 - 15.0 g/dL Final  1/61/0960 45.4   Final     HCT  Date Value Ref Range Status  02/21/2014 28.6* 36.0 - 46.0 % Final  08/20/2013 36   Final   Hospital Course:  Joyce Green is a 28 y.o. G1P0 at [redacted]w[redacted]d by LMP c/w 18wk sono admitted for induction of labor due to gestational HTN and Class B DM. On 02/20/14, she underwent primary LTCS due to fetal intolerance of labor with secondary arrest of labor. Blood loss for the procedure was 800 cc. The patient received a gram of Ancef prophylactically. The patient was taken to the recovery room in good stable condition with all counts being correct x3. Infants apgars were initially 1,3,4 and was intubated after birth and admitted to the NICU. She eventually transitioned to room air and has been with mother. Patient has been breast feeding. She desires Nexplanon for contraception.  Blood pressures in the post partum period initially in the severe range, but did not remain persistently elevated therefore did not require magnesium sulfate. She required several doses of diuretics for blood pressure management and was stable at 140s/80s on day of discharge. Labs within normal limits. Pt remained asymptomatic and labs within normal limits. She will be discharged on enalapril 10 mg daily at discharge given history of DM. She will follow up with RN at [facility] for repeat BPs in 1 week.  Pt previously taking levemir 50 u QHS prior to pregnancy given h/o type II DM along with metformin. On POD #3 she had episodes of hypoglycemia despite decreasing this dose but blood sugars remained stable 24 hours leading up to  discharge. She will continue levemir at discharge but at decreased dose of 10 units daily along with metformin. She will establish care with an endocrinologist. She was given instruction on blood sugar monitoring and when to notify MD.  Physical Exam:  General: alert, cooperative and no distress Lochia: appropriate Uterine Fundus: firm Incision: healing well DVT Evaluation: Calf/Ankle edema is present.  Discharge Diagnoses: Term Pregnancy-delivered  Discharge Information: Date: 02/24/2014 Activity: unrestricted and pelvic rest Diet: routine and consistent carbohydrate diet Medications: Percocet and enalapril, metformin, levemir Condition: stable Instructions: refer to practice specific booklet Discharge to: home   Newborn Data: Live born female  Birth Weight: 6 lb 1 oz (2750 g) APGAR: 1, 3  Home with mother.  Christianne Borrow A,MD, PGY3  Evaluation and management procedures were performed by Resident physician under my supervision/collaboration. Chart reviewed, patient examined by me and I agree with management and plan. 02/24/2014, 10:02 AM Danae Orleans, CNM 02/24/2014 10:46 AM

## 2014-02-24 NOTE — Lactation Note (Signed)
This note was copied from the chart of Joyce Benard Halsted. Lactation Consultation Note  Mom is pumping and obtaining a few mls of transitional milk.  She state she also latches the baby some and she latches well.  Reinforced importance of pumping to protect milk supply.  Outpatient lactation services encouraged.  WIC pump loaner completed.  Patient Name: Joyce Green [id number] Date: 02/24/2014     Maternal Data    Feeding    LATCH Score/Interventions                      Lactation Tools Discussed/Used     Consult Status      Huston Foley 02/24/2014, 11:20 AM

## 2014-03-01 ENCOUNTER — Telehealth: Payer: Self-pay | Admitting: *Deleted

## 2014-03-01 NOTE — Discharge Summary (Signed)
Pt needs short term follow up with her primary care MD for diabetes management.  Attestation of Attending Supervision of Advanced Practitioner (CNM/NP): Evaluation and management procedures were performed by the Advanced Practitioner under my supervision and collaboration. I have reviewed the Advanced Practitioner's note and chart, and I agree with the management and plan.  Guillermina Shaft H. 10:16 AM

## 2014-03-01 NOTE — Telephone Encounter (Signed)
Called and left message for patient on her voicemail regarding her status with having a primary care physician to follow her for her diabetes.  Currently awaiting a call back and will make appropriate referrals if they are necessary.

## 2014-03-01 NOTE — Telephone Encounter (Signed)
Message copied by Barbara Cower on Mon Mar 01, 2014 12:15 PM ------      Message from: Lesly Dukes      Created: Mon Mar 01, 2014 10:15 AM       Does this patient have good follow up for her diabetes???  I don't see an appt for her scheduled.  I might be looking in the wrong place. ------

## 2014-03-04 ENCOUNTER — Ambulatory Visit (INDEPENDENT_AMBULATORY_CARE_PROVIDER_SITE_OTHER): Payer: No Typology Code available for payment source | Admitting: Obstetrics & Gynecology

## 2014-03-04 ENCOUNTER — Encounter: Payer: Self-pay | Admitting: Obstetrics & Gynecology

## 2014-03-04 VITALS — BP 151/99 | HR 93 | Ht 70.0 in | Wt 228.8 lb

## 2014-03-04 DIAGNOSIS — Z9889 Other specified postprocedural states: Secondary | ICD-10-CM

## 2014-03-04 NOTE — Progress Notes (Signed)
Subjective:     Patient ID: Joyce Green, female   DOB: [birth year], 28 y.o.   MRN: [id number]  HPI Pt presents for 2 weeks post op check. Pt with complaints of HA that she thinks may be controlled by NSAIDS. She reports  That she is questioning her diabetes diagnosis however, she is currently on Insulin and Metformin with no episodes of hypoglycemia and normal glucose values per pt.  (pt does not have log with her).  Pt is tolerating a regular diet and voiding and passing stools with no problem.  Current Outpatient Prescriptions on File Prior to Visit  Medication Sig Dispense Refill  . ACCU-CHEK FASTCLIX LANCETS MISC 1 Units by Percutaneous route 4 (four) times daily.  100 each  12  . enalapril (VASOTEC) 10 MG tablet Take 1 tablet (10 mg total) by mouth daily.  30 tablet  3  . insulin detemir (LEVEMIR) 100 UNIT/ML injection Inject 0.1 mLs (10 Units total) into the skin daily.  10 mL  11  . metFORMIN (GLUCOPHAGE) 1000 MG tablet Take 1 tablet (1,000 mg total) by mouth 2 (two) times daily with a meal.  180 tablet  3  . oxyCODONE-acetaminophen (PERCOCET/ROXICET) 5-325 MG per tablet Take 1 tablet by mouth every 4 (four) hours as needed (for pain scale less than 7).  30 tablet  0  . Prenatal Multivit-Min-Fe-FA (PRENATAL VITAMINS PO) Take 1 tablet by mouth daily.        No current facility-administered medications on file prior to visit.     Review of Systems     Objective:   Physical Exam Pt in NAD BP 151/99  Pulse 93  Ht  (1.778 m)  Wt 228 lb 12.8 oz (103.783 kg)  BMI 32.83 kg/m2  LMP 05/21/2013  Breastfeeding? Yes Abd: incision C/D/I.  Dermabond glue noted- some removed. Soft, NT/ ND       Assessment:     2 week post op check H/o Class B DM H/o gestational HTN     Plan:     Keep current meds Excedrin or NSIADS prn HA Pt counseled to get a BP cuff from home HgbA1C at 6 week visit

## 2014-03-04 NOTE — Progress Notes (Signed)
Follow up from C-section.  Patient needs a new Pen called in to check her blood sugars, hers got thrown away at the hospital.

## 2014-03-04 NOTE — Patient Instructions (Signed)

## 2014-04-01 ENCOUNTER — Ambulatory Visit: Payer: No Typology Code available for payment source | Admitting: Obstetrics & Gynecology

## 2014-04-07 ENCOUNTER — Encounter (HOSPITAL_COMMUNITY): Payer: Self-pay | Admitting: Emergency Medicine

## 2014-04-07 DIAGNOSIS — E119 Type 2 diabetes mellitus without complications: Secondary | ICD-10-CM | POA: Diagnosis not present

## 2014-04-07 DIAGNOSIS — Z9889 Other specified postprocedural states: Secondary | ICD-10-CM | POA: Insufficient documentation

## 2014-04-07 DIAGNOSIS — K5909 Other constipation: Secondary | ICD-10-CM | POA: Insufficient documentation

## 2014-04-07 DIAGNOSIS — Z87891 Personal history of nicotine dependence: Secondary | ICD-10-CM | POA: Diagnosis not present

## 2014-04-07 DIAGNOSIS — Z794 Long term (current) use of insulin: Secondary | ICD-10-CM | POA: Insufficient documentation

## 2014-04-07 DIAGNOSIS — Z79899 Other long term (current) drug therapy: Secondary | ICD-10-CM | POA: Diagnosis not present

## 2014-04-07 DIAGNOSIS — I10 Essential (primary) hypertension: Secondary | ICD-10-CM | POA: Insufficient documentation

## 2014-04-07 DIAGNOSIS — R109 Unspecified abdominal pain: Secondary | ICD-10-CM | POA: Diagnosis present

## 2014-04-07 LAB — LIPASE, BLOOD: LIPASE: 17 U/L (ref 11–59)

## 2014-04-07 LAB — URINALYSIS, ROUTINE W REFLEX MICROSCOPIC
Bilirubin Urine: NEGATIVE
GLUCOSE, UA: NEGATIVE mg/dL
KETONES UR: 15 mg/dL — AB
LEUKOCYTES UA: NEGATIVE
NITRITE: NEGATIVE
Protein, ur: NEGATIVE mg/dL
Specific Gravity, Urine: 1.024 (ref 1.005–1.030)
Urobilinogen, UA: 1 mg/dL (ref 0.0–1.0)
pH: 5.5 (ref 5.0–8.0)

## 2014-04-07 LAB — CBC WITH DIFFERENTIAL/PLATELET
BASOS ABS: 0 10*3/uL (ref 0.0–0.1)
Basophils Relative: 0 % (ref 0–1)
EOS PCT: 3 % (ref 0–5)
Eosinophils Absolute: 0.2 10*3/uL (ref 0.0–0.7)
HCT: 35 % — ABNORMAL LOW (ref 36.0–46.0)
Hemoglobin: 11.3 g/dL — ABNORMAL LOW (ref 12.0–15.0)
LYMPHS PCT: 18 % (ref 12–46)
Lymphs Abs: 1.3 10*3/uL (ref 0.7–4.0)
MCH: 24.7 pg — ABNORMAL LOW (ref 26.0–34.0)
MCHC: 32.3 g/dL (ref 30.0–36.0)
MCV: 76.6 fL — AB (ref 78.0–100.0)
Monocytes Absolute: 0.4 10*3/uL (ref 0.1–1.0)
Monocytes Relative: 6 % (ref 3–12)
Neutro Abs: 5.4 10*3/uL (ref 1.7–7.7)
Neutrophils Relative %: 73 % (ref 43–77)
PLATELETS: 220 10*3/uL (ref 150–400)
RBC: 4.57 MIL/uL (ref 3.87–5.11)
RDW: 13.4 % (ref 11.5–15.5)
WBC: 7.3 10*3/uL (ref 4.0–10.5)

## 2014-04-07 LAB — URINE MICROSCOPIC-ADD ON

## 2014-04-07 LAB — COMPREHENSIVE METABOLIC PANEL
ALT: 9 U/L (ref 0–35)
AST: 13 U/L (ref 0–37)
Albumin: 3.5 g/dL (ref 3.5–5.2)
Alkaline Phosphatase: 112 U/L (ref 39–117)
Anion gap: 12 (ref 5–15)
BUN: 16 mg/dL (ref 6–23)
CALCIUM: 8.9 mg/dL (ref 8.4–10.5)
CO2: 23 meq/L (ref 19–32)
CREATININE: 0.72 mg/dL (ref 0.50–1.10)
Chloride: 104 mEq/L (ref 96–112)
GFR calc Af Amer: >90 mL/min (ref >90)
Glucose, Bld: 145 mg/dL — ABNORMAL HIGH (ref 70–99)
Potassium: 3.8 mEq/L (ref 3.7–5.3)
Sodium: 139 mEq/L (ref 137–147)
TOTAL PROTEIN: 7.4 g/dL (ref 6.0–8.3)
Total Bilirubin: 0.3 mg/dL (ref 0.3–1.2)

## 2014-04-07 NOTE — ED Notes (Signed)
Pt. reports intermittent RUQ pain radiating to back , denies nausea or vomitting , no diarrhea , denies fever or chills .

## 2014-04-07 NOTE — ED Notes (Signed)
No answer when called to recheck vitals 

## 2014-04-08 ENCOUNTER — Emergency Department (HOSPITAL_COMMUNITY): Payer: Medicaid Other

## 2014-04-08 ENCOUNTER — Emergency Department (HOSPITAL_COMMUNITY)
Admission: EM | Admit: 2014-04-08 | Discharge: 2014-04-08 | Disposition: A | Discharge status: Home / Self Care | Payer: Medicaid Other | Attending: Emergency Medicine | Admitting: Emergency Medicine

## 2014-04-08 DIAGNOSIS — R109 Unspecified abdominal pain: Secondary | ICD-10-CM

## 2014-04-08 DIAGNOSIS — K5909 Other constipation: Secondary | ICD-10-CM

## 2014-04-08 HISTORY — DX: Essential (primary) hypertension: I10

## 2014-04-08 MED ORDER — POLYETHYLENE GLYCOL 3350 17 GM/SCOOP PO POWD: ORAL | Status: DC

## 2014-04-08 MED ORDER — OXYCODONE-ACETAMINOPHEN 5-325 MG PO TABS
1.0000 | ORAL_TABLET | Freq: Once | ORAL | Status: AC
  Administered 2014-04-08: 1 via ORAL
  Filled 2014-04-08: qty 1

## 2014-04-08 MED ORDER — ONDANSETRON 4 MG PO TBDP
8.0000 mg | ORAL_TABLET | Freq: Once | ORAL | Status: AC
  Administered 2014-04-08: 8 mg via ORAL
  Filled 2014-04-08: qty 2

## 2014-04-08 NOTE — ED Notes (Signed)
Pain med given 

## 2014-04-08 NOTE — Discharge Instructions (Signed)

## 2014-04-08 NOTE — ED Notes (Signed)
Pt alert and oriented at discharge.  Pt ambulatory at discharge to the waiting room by this RN.

## 2014-04-08 NOTE — ED Notes (Signed)
Upset over the wait c/o pain

## 2014-04-08 NOTE — ED Provider Notes (Signed)
CSN: [id number]     Arrival date & time 04/07/14  2123 History   First MD Initiated Contact with Patient 04/08/14 0059     Chief Complaint  Patient presents with  . Abdominal Pain     (Consider location/radiation/quality/duration/timing/severity/associated sxs/prior Treatment) HPI  This is a 28 year old female with history of diabetes, hypertension, and recent cesarean section proximal 7 weeks ago who presents with abdominal pain. Patient reports onset of pain today. She reports intermittent right-sided pain that comes and goes.  She states that it is achy and crampy in nature. Denies any nausea, vomiting, or diarrhea. Does report constipation following cesarean. She states she has had multiple small bowel movements today with some relief of her pain after bowel movement. Pain is not associated with eating. Denies any fevers, dysuria, or hematuria.  Past Medical History  Diagnosis Date  . Palpitations   . Diabetes mellitus without complication   . Hypertension    Past Surgical History  Procedure Laterality Date  . Cesarean section N/A 02/20/2014    Procedure: CESAREAN SECTION;  Surgeon: Lazaro ArmsLuther H Eure, MD;  Location: [facility];  Service: Obstetrics;  Laterality: N/A;   Family History  Problem Relation Age of Onset  . Diabetes Mellitus I Mother   . Hypertension Mother   . Diabetes Mother   . Hypertension Father   . Cancer Neg Hx   . Early death Neg Hx   . Heart disease Neg Hx   . Hyperlipidemia Neg Hx   . Kidney disease Neg Hx   . Stroke Neg Hx    History  Substance Use Topics  . Smoking status: Former Smoker -- 0.00 packs/day for 0 years    Quit date: 06/11/2009  . Smokeless tobacco: Never Used  . Alcohol Use: 2.4 oz/week    2 Glasses of wine, 2 Cans of beer per week     Comment: occasional; stopped after found out she was pregnant   OB History   Grav Para Term Preterm Abortions TAB SAB Ect Mult Living   1 1 1       1      Review of Systems  Constitutional: Negative  for fever.  Respiratory: Negative for chest tightness and shortness of breath.   Cardiovascular: Negative for chest pain.  Gastrointestinal: Positive for abdominal pain and constipation. Negative for nausea, vomiting, diarrhea, blood in stool and abdominal distention.  Genitourinary: Negative for dysuria, hematuria and flank pain.  Musculoskeletal: Negative for back pain.  Neurological: Negative for headaches.  All other systems reviewed and are negative.     Allergies  Review of patient's allergies indicates no known allergies.  Home Medications   Prior to Admission medications   Medication Sig Start Date End Date Taking? Authorizing Provider  enalapril (VASOTEC) 10 MG tablet Take 1 tablet (10 mg total) by mouth daily. 02/25/14  Yes Deirdre C Poe, CNM  insulin detemir (LEVEMIR) 100 UNIT/ML injection Inject 0.1 mLs (10 Units total) into the skin daily. 02/24/14  Yes Deirdre C Poe, CNM  metFORMIN (GLUCOPHAGE) 1000 MG tablet Take 1 tablet (1,000 mg total) by mouth 2 (two) times daily with a meal. 11/06/13  Yes Reva Boresanya S Pratt, MD  naproxen sodium (ANAPROX) 220 MG tablet Take 220 mg by mouth 2 (two) times daily as needed (for pain).   Yes Historical Provider, MD  ACCU-CHEK FASTCLIX LANCETS MISC 1 Units by Percutaneous route 4 (four) times daily. 02/24/14   Deirdre Colin Mulders Poe, CNM  polyethylene glycol powder (MIRALAX) powder Take one  capful by mouth twice daily as directed on bottle until you have loose stools. Then, take one capful by mouth once daily. 04/08/14   Shon Baton, MD   BP 146/96  Pulse 83  Temp(Src) 98.6 F (37 C) (Oral)  Resp 22  Ht 5\' 11"  (1.803 m)  Wt 234 lb 9.6 oz (106.414 kg)  BMI 32.73 kg/m2  SpO2 100% Physical Exam  Nursing note and vitals reviewed. Constitutional: She is oriented to person, place, and time. She appears well-developed and well-nourished. No distress.  HENT:  Head: Normocephalic and atraumatic.  Eyes: Pupils are equal, round, and reactive to light.   Cardiovascular: Normal rate, regular rhythm and normal heart sounds.   No murmur heard. Pulmonary/Chest: Effort normal and breath sounds normal. No respiratory distress. She has no wheezes.  Abdominal: Soft. Bowel sounds are normal. There is no tenderness. There is no rebound and no guarding.  Low horizontal C-section well-healing, incision clean, dry, intact  Neurological: She is alert and oriented to person, place, and time.  Skin: Skin is warm and dry.  Psychiatric: She has a normal mood and affect.    ED Course  Procedures (including critical care time) Labs Review Labs Reviewed  CBC WITH DIFFERENTIAL - Abnormal; Notable for the following:    Hemoglobin 11.3 (*)    HCT 35.0 (*)    MCV 76.6 (*)    MCH 24.7 (*)    All other components within normal limits  COMPREHENSIVE METABOLIC PANEL - Abnormal; Notable for the following:    Glucose, Bld 145 (*)    All other components within normal limits  URINALYSIS, ROUTINE W REFLEX MICROSCOPIC - Abnormal; Notable for the following:    APPearance CLOUDY (*)    Hgb urine dipstick LARGE (*)    Ketones, ur 15 (*)    All other components within normal limits  URINE MICROSCOPIC-ADD ON - Abnormal; Notable for the following:    Squamous Epithelial / LPF MANY (*)    Bacteria, UA FEW (*)    Casts GRANULAR CAST (*)    All other components within normal limits  URINE CULTURE  LIPASE, BLOOD    Imaging Review Dg Abd 1 View  04/08/2014   CLINICAL DATA:  Right abdominal pain.  Recent constipation.  EXAM: ABDOMEN - 1 VIEW  COMPARISON:  None.  FINDINGS: Moderate volume of formed stool, filling the ascending and transverse colon. No concerning intra-abdominal mass effect. Pelvic calcifications are likely phleboliths. No small bowel obstruction.  IMPRESSION: Prominent stool in the proximal colon.  No bowel obstruction.   Electronically Signed   By: Tiburcio Pea M.D.   On: 04/08/2014 02:42     EKG Interpretation None      MDM   Final  diagnoses:  Abdominal pain  Other constipation    Patient presents with abdominal pain. Pain improved with bowel movements. Reports constipation following C-section 7 weeks ago. Lab work is largely reassuring. She does have 3-6 white cells in her urine and few bacteria. Denies dysuria. We'll send for culture. KUB shows prominent stool burden. Suspect abdominal pain secondary to constipation. Discussed with patient aggressive bowel regimen with Miralax twice daily until stools are loose. She has not been taking any stool softeners or laxatives.  Patient instructed to avoid narcotic medications. Patient stated understanding.  After history, exam, and medical workup I feel the patient has been appropriately medically screened and is safe for discharge home. Pertinent diagnoses were discussed with the patient. Patient was given return precautions.  Shon Batonourtney F Dublin Cantero, MD 04/08/14 [phone]

## 2014-04-09 LAB — URINE CULTURE

## 2014-04-12 ENCOUNTER — Encounter (HOSPITAL_COMMUNITY): Payer: Self-pay | Admitting: Emergency Medicine

## 2014-12-01 ENCOUNTER — Emergency Department
Admission: EM | Admit: 2014-12-01 | Discharge: 2014-12-01 | Disposition: A | Discharge status: Home / Self Care | Payer: Medicaid Other | Attending: Emergency Medicine | Admitting: Emergency Medicine

## 2014-12-01 ENCOUNTER — Encounter: Payer: Self-pay | Admitting: Emergency Medicine

## 2014-12-01 DIAGNOSIS — E119 Type 2 diabetes mellitus without complications: Secondary | ICD-10-CM | POA: Insufficient documentation

## 2014-12-01 DIAGNOSIS — Z87891 Personal history of nicotine dependence: Secondary | ICD-10-CM | POA: Insufficient documentation

## 2014-12-01 DIAGNOSIS — J0100 Acute maxillary sinusitis, unspecified: Secondary | ICD-10-CM | POA: Diagnosis not present

## 2014-12-01 DIAGNOSIS — R0981 Nasal congestion: Secondary | ICD-10-CM | POA: Diagnosis present

## 2014-12-01 MED ORDER — AMOXICILLIN-POT CLAVULANATE 875-125 MG PO TABS: 1.0000 | ORAL_TABLET | Freq: Two times a day (BID) | ORAL | Status: AC

## 2014-12-01 NOTE — Discharge Instructions (Signed)
Take medication as prescribed. Drink plenty of water at home. Rest. Take over-the-counter Tylenol or ibuprofen as needed for pain or fever. Follow-up with her primary care physician or the above as needed.  Return to the ER for new or worsening concerns.  Sinusitis Sinusitis is redness, soreness, and inflammation of the paranasal sinuses. Paranasal sinuses are air pockets within the bones of your face (beneath the eyes, the middle of the forehead, or above the eyes). In healthy paranasal sinuses, mucus is able to drain out, and air is able to circulate through them by way of your nose. However, when your paranasal sinuses are inflamed, mucus and air can become trapped. This can allow bacteria and other germs to grow and cause infection. Sinusitis can develop quickly and last only a short time (acute) or continue over a long period (chronic). Sinusitis that lasts for more than 12 weeks is considered chronic.  CAUSES  Causes of sinusitis include:  Allergies.  Structural abnormalities, such as displacement of the cartilage that separates your nostrils (deviated septum), which can decrease the air flow through your nose and sinuses and affect sinus drainage.  Functional abnormalities, such as when the small hairs (cilia) that line your sinuses and help remove mucus do not work properly or are not present. SIGNS AND SYMPTOMS  Symptoms of acute and chronic sinusitis are the same. The primary symptoms are pain and pressure around the affected sinuses. Other symptoms include:  Upper toothache.  Earache.  Headache.  Bad breath.  Decreased sense of smell and taste.  A cough, which worsens when you are lying flat.  Fatigue.  Fever.  Thick drainage from your nose, which often is green and may contain pus (purulent).  Swelling and warmth over the affected sinuses. DIAGNOSIS  Your health care provider will perform a physical exam. During the exam, your health care provider may:  Look in  your nose for signs of abnormal growths in your nostrils (nasal polyps).  Tap over the affected sinus to check for signs of infection.  View the inside of your sinuses (endoscopy) using an imaging device that has a light attached (endoscope). If your health care provider suspects that you have chronic sinusitis, one or more of the following tests may be recommended:  Allergy tests.  Nasal culture. A sample of mucus is taken from your nose, sent to a lab, and screened for bacteria.  Nasal cytology. A sample of mucus is taken from your nose and examined by your health care provider to determine if your sinusitis is related to an allergy. TREATMENT  Most cases of acute sinusitis are related to a viral infection and will resolve on their own within 10 days. Sometimes medicines are prescribed to help relieve symptoms (pain medicine, decongestants, nasal steroid sprays, or saline sprays).  However, for sinusitis related to a bacterial infection, your health care provider will prescribe antibiotic medicines. These are medicines that will help kill the bacteria causing the infection.  Rarely, sinusitis is caused by a fungal infection. In theses cases, your health care provider will prescribe antifungal medicine. For some cases of chronic sinusitis, surgery is needed. Generally, these are cases in which sinusitis recurs more than 3 times per year, despite other treatments. HOME CARE INSTRUCTIONS   Drink plenty of water. Water helps thin the mucus so your sinuses can drain more easily.  Use a humidifier.  Inhale steam 3 to 4 times a day (for example, sit in the bathroom with the shower running).  Apply a  warm, moist washcloth to your face 3 to 4 times a day, or as directed by your health care provider.  Use saline nasal sprays to help moisten and clean your sinuses.  Take medicines only as directed by your health care provider.  If you were prescribed either an antibiotic or antifungal medicine,  finish it all even if you start to feel better. SEEK IMMEDIATE MEDICAL CARE IF:  You have increasing pain or severe headaches.  You have nausea, vomiting, or drowsiness.  You have swelling around your face.  You have vision problems.  You have a stiff neck.  You have difficulty breathing. MAKE SURE YOU:   Understand these instructions.  Will watch your condition.  Will get help right away if you are not doing well or get worse. Document Released: 05/28/2005 Document Revised: 10/12/2013 Document Reviewed: 06/12/2011 [facility] Patient Information 2015 [place], [place]. This information is not intended to replace advice given to you by your health care provider. Make sure you discuss any questions you have with your health care provider.

## 2014-12-01 NOTE — ED Notes (Signed)
Patient states she has been sick with cold symptoms for a week and states "its been too long".

## 2014-12-01 NOTE — ED Provider Notes (Signed)
[facility] Emergency Department Provider Note  ____________________________________________  Time seen: Approximately 8:04 PM  I have reviewed the triage vital signs and the nursing notes.   HISTORY  Chief Complaint URI   HPI Joyce Green is a 29 y.o. female presents to the ER for complaints of 1-2 weeks of runny nose and congestion. Patient states that she has some cough but states it is only at night. Patient states that she is frequently getting greenish mucus from nose. As well as complains of facial pressure with intermittent headache.  Denies chest pain, shortness of breath, abdominal pain or fever. Reports continues to eat injury well. Reports that is not relieved with over-the-counter mucinex or theraflu. Denies pain at this time.    Past Medical History  Diagnosis Date  . Palpitations             Patient Active Problem List   Diagnosis Date Noted  . S/P cesarean section 02/20/2014  . Gestational hypertension 02/17/2014  . Group B Streptococcus carrier, +RV culture, currently pregnant 02/04/2014  . ? of Benign essential hypertension antepartum 01/25/2014  . Supervision of high-risk pregnancy 09/28/2013  . Tobacco use disorder 01/08/2013  . Obesity (BMI 30-39.9) 01/08/2013  . Type II or unspecified type diabetes mellitus without mention of complication, uncontrolled 01/07/2013  . Preexisting diabetes complicating pregnancy, antepartum 11/07/2012    Past Surgical History  Procedure Laterality Date  . Cesarean section N/A 02/20/2014    Procedure: CESAREAN SECTION;  Surgeon: Lazaro Arms, MD;  Location: [facility];  Service: Obstetrics;  Laterality: N/A;    Current Outpatient Rx  Name  Route  Sig  Dispense  Refill  .           .           .           .           .           .             Allergies Review of patient's allergies indicates no known allergies.  Family History  Problem Relation Age of Onset  . Diabetes Mellitus I  Mother   . Hypertension Mother   . Diabetes Mother   . Hypertension Father   . Cancer Neg Hx   . Early death Neg Hx   . Heart disease Neg Hx   . Hyperlipidemia Neg Hx   . Kidney disease Neg Hx   . Stroke Neg Hx     Social History History  Substance Use Topics  . Smoking status: Former Smoker -- 0.00 packs/day for 0 years    Quit date: 06/11/2009  . Smokeless tobacco: Never Used  . Alcohol Use: 2.4 oz/week    2 Glasses of wine, 2 Cans of beer per week     Comment: occasional; stopped after found out she was pregnant    Review of Systems Constitutional: No fever/chills Eyes: No visual changes. ENT: No sore throat. Positive for congestion.  Cardiovascular: Denies chest pain. Respiratory: Denies shortness of breath. Gastrointestinal: No abdominal pain.  No nausea, no vomiting.  No diarrhea.  No constipation. Genitourinary: Negative for dysuria. Musculoskeletal: Negative for back pain. Skin: Negative for rash. Neurological: Negative for headaches, focal weakness or numbness.  10-point ROS otherwise negative.  ____________________________________________   PHYSICAL EXAM:  VITAL SIGNS: ED Triage Vitals  Enc Vitals Group     BP 12/01/14 1748 128/98 mmHg  Pulse Rate 12/01/14 1748 88     Resp 12/01/14 1748 18     Temp 12/01/14 1748 98.3 F (36.8 C)     Temp Source 12/01/14 1748 Oral     SpO2 12/01/14 1748 99 %     Weight --      Height --      Head Cir --      Peak Flow --      Pain Score --      Pain Loc --      Pain Edu? --      Excl. in GC? --     Constitutional: Alert and oriented. Well appearing and in no acute distress. Eyes: Conjunctivae are normal. PERRL. EOMI. Head: [email or web address] to mod TTP maxillary sinus Ears: no erythema, normal TMs.  Nose: congestion and mucosal inflammation.  Mouth/Throat: Mucous membranes are moist.  Oropharynx non-erythematous. Neck: No stridor.  No cervical spine tenderness to  palpation. Hematological/Lymphatic/Immunilogical: No cervical lymphadenopathy. Cardiovascular: Normal rate, regular rhythm. Grossly normal heart sounds.  Good peripheral circulation. Respiratory: Normal respiratory effort.  No retractions. Lungs CTAB. Gastrointestinal: Soft and nontender. No distention.  Musculoskeletal: No lower extremity tenderness nor edema.  No joint effusions. Neurologic:  Normal speech and language. No gross focal neurologic deficits are appreciated. Speech is normal. No gait instability. Skin:  Skin is warm, dry and intact. No rash noted. Psychiatric: Mood and affect are normal. Speech and behavior are normal.  ____________________________________________   LABS (all labs ordered are listed, but only abnormal results are displayed)  Labs Reviewed - No data to display ____________________________________________ ________________________________________   INITIAL IMPRESSION / ASSESSMENT AND PLAN / ED COURSE  Pertinent labs & imaging results that were available during my care of the patient were reviewed by me and considered in my medical decision making (see chart for details).  No acute distress very well-appearing patient. Continues to eat and drink well. Presents to the ER complaining of 1-2 weeks of runny nose, cough and congestion. Patient exam consistent with maxillary sinusitis. Will treat patient with oral Augmentin. Discussed follow-up and return parameters. Patient agrees to plan. ____________________________________________   FINAL CLINICAL IMPRESSION(S) / ED DIAGNOSES  Final diagnoses:  Acute maxillary sinusitis, recurrence not specified      Renford Dills, NP 12/01/14 2013  Myrna Blazer, MD 12/01/14 2306

## 2014-12-01 NOTE — ED Notes (Signed)
States she has had cold sx's for about 2 weeks..nasal congestion and occasional prod cough

## 2015-02-24 ENCOUNTER — Encounter: Payer: Self-pay | Admitting: Emergency Medicine

## 2015-02-24 ENCOUNTER — Emergency Department
Admission: EM | Admit: 2015-02-24 | Discharge: 2015-02-24 | Disposition: A | Discharge status: Home / Self Care | Payer: Medicaid Other | Attending: Student | Admitting: Student

## 2015-02-24 DIAGNOSIS — Z87891 Personal history of nicotine dependence: Secondary | ICD-10-CM | POA: Diagnosis not present

## 2015-02-24 DIAGNOSIS — N61 Inflammatory disorders of breast: Secondary | ICD-10-CM | POA: Diagnosis not present

## 2015-02-24 DIAGNOSIS — N644 Mastodynia: Secondary | ICD-10-CM | POA: Diagnosis present

## 2015-02-24 DIAGNOSIS — Z794 Long term (current) use of insulin: Secondary | ICD-10-CM | POA: Diagnosis not present

## 2015-02-24 DIAGNOSIS — R030 Elevated blood-pressure reading, without diagnosis of hypertension: Secondary | ICD-10-CM

## 2015-02-24 DIAGNOSIS — Z79899 Other long term (current) drug therapy: Secondary | ICD-10-CM | POA: Insufficient documentation

## 2015-02-24 DIAGNOSIS — I10 Essential (primary) hypertension: Secondary | ICD-10-CM | POA: Diagnosis not present

## 2015-02-24 DIAGNOSIS — E119 Type 2 diabetes mellitus without complications: Secondary | ICD-10-CM | POA: Diagnosis not present

## 2015-02-24 DIAGNOSIS — IMO0001 Reserved for inherently not codable concepts without codable children: Secondary | ICD-10-CM

## 2015-02-24 MED ORDER — HYDROCODONE-ACETAMINOPHEN 5-325 MG PO TABS: 1.0000 | ORAL_TABLET | ORAL | Status: DC | PRN

## 2015-02-24 MED ORDER — HYDROCHLOROTHIAZIDE 25 MG PO TABS
25.0000 mg | ORAL_TABLET | Freq: Every day | ORAL | Status: DC
Start: 2015-02-24 — End: 2016-01-24

## 2015-02-24 MED ORDER — SULFAMETHOXAZOLE-TRIMETHOPRIM 800-160 MG PO TABS: 1.0000 | ORAL_TABLET | Freq: Two times a day (BID) | ORAL | Status: DC

## 2015-02-24 NOTE — ED Notes (Signed)
Possible abscess area under left breast   States she noticed it on sat.

## 2015-02-24 NOTE — Discharge Instructions (Signed)

## 2015-02-24 NOTE — ED Provider Notes (Signed)
[facility] Emergency Department Provider Note  ____________________________________________  Time seen: Approximately 4:27 PM  I have reviewed the triage vital signs and the nursing notes.   HISTORY  Chief Complaint Abscess   HPI Joyce Green is a 29 y.o. female is here today with complaint of questionable abscess to her left breast since days ago. Patient denies any fever or chills. She has not had any similar area such as this. There is been no drainage that she's been aware of. Currently she rates her pain as a 3 out of 10.Patient is on metformin for diabetes and levemir.     Past Medical History  Diagnosis Date  . Palpitations   . Diabetes mellitus without complication   . Hypertension     Patient Active Problem List   Diagnosis Date Noted  . S/P cesarean section 02/20/2014  . Gestational hypertension 02/17/2014  . Group B Streptococcus carrier, +RV culture, currently pregnant 02/04/2014  . ? of Benign essential hypertension antepartum 01/25/2014  . Supervision of high-risk pregnancy 09/28/2013  . Tobacco use disorder 01/08/2013  . Obesity (BMI 30-39.9) 01/08/2013  . Type II or unspecified type diabetes mellitus without mention of complication, uncontrolled 01/07/2013  . Preexisting diabetes complicating pregnancy, antepartum 11/07/2012    Past Surgical History  Procedure Laterality Date  . Cesarean section N/A 02/20/2014    Procedure: CESAREAN SECTION;  Surgeon: Lazaro Arms, MD;  Location: [facility];  Service: Obstetrics;  Laterality: N/A;    Current Outpatient Rx  Name  Route  Sig  Dispense  Refill  . ACCU-CHEK FASTCLIX LANCETS MISC   Percutaneous   1 Units by Percutaneous route 4 (four) times daily.   100 each   12   . enalapril (VASOTEC) 10 MG tablet   Oral   Take 1 tablet (10 mg total) by mouth daily.   30 tablet   3   . hydrochlorothiazide (HYDRODIURIL) 25 MG tablet   Oral   Take 1 tablet (25 mg total) by mouth daily.    30 tablet   1   . HYDROcodone-acetaminophen (NORCO/VICODIN) 5-325 MG per tablet   Oral   Take 1 tablet by mouth every 4 (four) hours as needed for moderate pain.   20 tablet   0   . insulin detemir (LEVEMIR) 100 UNIT/ML injection   Subcutaneous   Inject 0.1 mLs (10 Units total) into the skin daily.   10 mL   11   . metFORMIN (GLUCOPHAGE) 1000 MG tablet   Oral   Take 1 tablet (1,000 mg total) by mouth 2 (two) times daily with a meal.   180 tablet   3   . naproxen sodium (ANAPROX) 220 MG tablet   Oral   Take 220 mg by mouth 2 (two) times daily as needed (for pain).         . polyethylene glycol powder (MIRALAX) powder      Take one capful by mouth twice daily as directed on bottle until you have loose stools. Then, take one capful by mouth once daily.   255 g   0   . sulfamethoxazole-trimethoprim (BACTRIM DS,SEPTRA DS) 800-160 MG per tablet   Oral   Take 1 tablet by mouth 2 (two) times daily.   20 tablet   0     Allergies Review of patient's allergies indicates no known allergies.  Family History  Problem Relation Age of Onset  . Diabetes Mellitus I Mother   . Hypertension Mother   .  Diabetes Mother   . Hypertension Father   . Cancer Neg Hx   . Early death Neg Hx   . Heart disease Neg Hx   . Hyperlipidemia Neg Hx   . Kidney disease Neg Hx   . Stroke Neg Hx     Social History Social History  Substance Use Topics  . Smoking status: Former Smoker -- 0.00 packs/day for 0 years    Quit date: 06/11/2009  . Smokeless tobacco: Never Used  . Alcohol Use: 2.4 oz/week    2 Glasses of wine, 2 Cans of beer per week     Comment: occasional; stopped after found out she was pregnant    Review of Systems Constitutional: No fever/chills Cardiovascular: Denies chest pain. Respiratory: Denies shortness of breath. Gastrointestinal: .  No nausea, no vomiting. Genitourinary: Negative for dysuria. Musculoskeletal: Negative for back pain. Skin: Positive redness left  breast Neurological: Negative for headaches, focal weakness or numbness.  10-point ROS otherwise negative.  ____________________________________________   PHYSICAL EXAM:  VITAL SIGNS: ED Triage Vitals  Enc Vitals Group     BP 02/24/15 1559 173/105 mmHg     Pulse Rate 02/24/15 1559 89     Resp --      Temp 02/24/15 1559 98.2 F (36.8 C)     Temp Source 02/24/15 1559 Oral     SpO2 02/24/15 1559 100 %     Weight 02/24/15 1559 230 lb (104.327 kg)     Height 02/24/15 1559 5\' 11"  (1.803 m)     Head Cir --      Peak Flow --      Pain Score 02/24/15 1559 3     Pain Loc --      Pain Edu? --      Excl. in GC? --     Constitutional: Alert and oriented. Well appearing and in no acute distress. Eyes: Conjunctivae are normal. PERRL. EOMI. Head: Atraumatic. Nose: No congestion/rhinnorhea. Neck: No stridor.   Cardiovascular: Normal rate, regular rhythm. Grossly normal heart sounds.  Good peripheral circulation. Respiratory: Normal respiratory effort.  No retractions. Lungs CTAB. Gastrointestinal: Soft and nontender. No distention. No abdominal bruits. No CVA tenderness. Musculoskeletal: No lower extremity tenderness nor edema.  No joint effusions. Neurologic:  Normal speech and language. No gross focal neurologic deficits are appreciated. No gait instability. Skin:  Skin is warm, dry and intact. No rash noted. Left breast at the nipple area medially there is a approximately 2 cm area of redness without drainage. There is one area that looks like skin has peeled back and his dry skin. There is some slight warmth to the area and mild tenderness. No abscess formation was identified. Psychiatric: Mood and affect are normal. Speech and behavior are normal.  ____________________________________________   LABS (all labs ordered are listed, but only abnormal results are displayed)  Labs Reviewed - No data to display  PROCEDURES  Procedure(s) performed: None  Critical Care performed:  No  ____________________________________________   INITIAL IMPRESSION / ASSESSMENT AND PLAN / ED COURSE  Pertinent labs & imaging results that were available during my care of the patient were reviewed by me and considered in my medical decision making (see chart for details).  Patient was placed on Bactrim for 10 days and Norco as needed for pain. She is encouraged to use warm compresses to the area and to return to the emergency room if any severe worsening of her symptoms. ____________________________________________   FINAL CLINICAL IMPRESSION(S) / ED DIAGNOSES  Final  diagnoses:  Cellulitis of female breast  Elevated blood pressure      Tommi Rumps, PA-C 02/25/15 0009  Gayla Doss, MD 02/28/15 [phone]

## 2015-02-24 NOTE — ED Notes (Signed)
Pt to ED with c/o painful abscess to left breast area since Saturday

## 2015-10-10 ENCOUNTER — Emergency Department
Admission: EM | Admit: 2015-10-10 | Discharge: 2015-10-10 | Disposition: A | Discharge status: Home / Self Care | Payer: Medicaid Other | Attending: Emergency Medicine | Admitting: Emergency Medicine

## 2015-10-10 ENCOUNTER — Encounter: Payer: Self-pay | Admitting: Emergency Medicine

## 2015-10-10 DIAGNOSIS — Z79899 Other long term (current) drug therapy: Secondary | ICD-10-CM | POA: Diagnosis not present

## 2015-10-10 DIAGNOSIS — Z794 Long term (current) use of insulin: Secondary | ICD-10-CM | POA: Insufficient documentation

## 2015-10-10 DIAGNOSIS — E669 Obesity, unspecified: Secondary | ICD-10-CM | POA: Diagnosis not present

## 2015-10-10 DIAGNOSIS — E1165 Type 2 diabetes mellitus with hyperglycemia: Secondary | ICD-10-CM | POA: Diagnosis not present

## 2015-10-10 DIAGNOSIS — K0889 Other specified disorders of teeth and supporting structures: Secondary | ICD-10-CM | POA: Insufficient documentation

## 2015-10-10 DIAGNOSIS — I10 Essential (primary) hypertension: Secondary | ICD-10-CM | POA: Insufficient documentation

## 2015-10-10 DIAGNOSIS — R51 Headache: Secondary | ICD-10-CM | POA: Diagnosis present

## 2015-10-10 DIAGNOSIS — Z87891 Personal history of nicotine dependence: Secondary | ICD-10-CM | POA: Diagnosis not present

## 2015-10-10 LAB — BASIC METABOLIC PANEL
Anion gap: 8 (ref 5–15)
BUN: 15 mg/dL (ref 6–20)
CHLORIDE: 103 mmol/L (ref 101–111)
CO2: 24 mmol/L (ref 22–32)
Calcium: 9 mg/dL (ref 8.9–10.3)
Creatinine, Ser: 0.7 mg/dL (ref 0.44–1.00)
GFR calc non Af Amer: >60 mL/min (ref >60)
Glucose, Bld: 334 mg/dL — ABNORMAL HIGH (ref 65–99)
POTASSIUM: 4.1 mmol/L (ref 3.5–5.1)
SODIUM: 135 mmol/L (ref 135–145)

## 2015-10-10 LAB — URINALYSIS COMPLETE WITH MICROSCOPIC (ARMC ONLY)
Bacteria, UA: NONE SEEN
Bilirubin Urine: NEGATIVE
Ketones, ur: NEGATIVE mg/dL
Leukocytes, UA: NEGATIVE
NITRITE: NEGATIVE
PROTEIN: NEGATIVE mg/dL
SPECIFIC GRAVITY, URINE: 1.016 (ref 1.005–1.030)
Squamous Epithelial / LPF: NONE SEEN
pH: 5 (ref 5.0–8.0)

## 2015-10-10 LAB — CBC
HCT: 38.9 % (ref 35.0–47.0)
HEMOGLOBIN: 12.8 g/dL (ref 12.0–16.0)
MCH: 25.8 pg — ABNORMAL LOW (ref 26.0–34.0)
MCHC: 33 g/dL (ref 32.0–36.0)
MCV: 78.2 fL — ABNORMAL LOW (ref 80.0–100.0)
Platelets: 200 10*3/uL (ref 150–440)
RBC: 4.98 MIL/uL (ref 3.80–5.20)
RDW: 13.6 % (ref 11.5–14.5)
WBC: 6.7 10*3/uL (ref 3.6–11.0)

## 2015-10-10 LAB — GLUCOSE, CAPILLARY: GLUCOSE-CAPILLARY: 324 mg/dL — AB (ref 65–99)

## 2015-10-10 LAB — POCT PREGNANCY, URINE: Preg Test, Ur: NEGATIVE

## 2015-10-10 MED ORDER — METFORMIN HCL 500 MG PO TABS
1000.0000 mg | ORAL_TABLET | ORAL | Status: AC
  Administered 2015-10-10: 1000 mg via ORAL
  Filled 2015-10-10: qty 2

## 2015-10-10 MED ORDER — METFORMIN HCL 500 MG PO TABS
ORAL_TABLET | ORAL | Status: AC
  Filled 2015-10-10: qty 2

## 2015-10-10 MED ORDER — METFORMIN HCL 1000 MG PO TABS: 1000.0000 mg | ORAL_TABLET | Freq: Two times a day (BID) | ORAL | Status: DC

## 2015-10-10 NOTE — ED Notes (Signed)
Says she has headache right side.  Has had this before.   Also had some teeth removed about a week ago from right side mouth.

## 2015-10-10 NOTE — ED Notes (Signed)
Spoke with Dr Scotty CourtStafford on pt's condition and hx of diabetes; pt given f/u clinic information. Verbal order for CBC, BMP and UA given.

## 2015-10-10 NOTE — ED Notes (Signed)
Pt reports right sided headache x2 weeks; reports she had 2 wisdom teeth and a molar removed 3 weeks ago. Pt is also diabetic, would like to have CBG checked.

## 2015-10-10 NOTE — ED Notes (Signed)
Pt's CBG 325; reports she hasnt been taking her [facility] medications [facility]. Pt given information on medication management clinic, [facility] and [facility].

## 2015-10-10 NOTE — ED Provider Notes (Signed)
[facility] Emergency Department Provider Note  ____________________________________________  Time seen: Approximately 2:31 PM  I have reviewed the triage vital signs and the nursing notes.   HISTORY  Chief Complaint Headache    HPI Joyce Green is a 30 y.o. female presents for evaluation of intermittent headache pain that seemed to start in her right upper jaw for the last 3 weeks. This started just after having a tooth pulled from the right upper jaw. She reports that she is an achy sharp pain that will come and go and starts in the right jaw and radiates up over the face. No pain in her eye, ear, trouble hearing, weakness patient droop. She reports the pain goes away with Tylenol and ibuprofen. In addition she was told to come here today to have her blood sugar checked as she is trying to enroll with primary care, but is not typically able to set up an appointment so nurse advised her to come to the ER to be "checked out".  She was previously on metformin and insulin, but stopped after pregnancy believing that she may have improved because she lost weight and had a better diet. She was lost to follow-up.  Denies any weakness, nausea, vomiting, frequent urination, abdominal pain chest pain or other concerns.   Past Medical History  Diagnosis Date  . Palpitations   . Diabetes mellitus without complication (HCC)   . Hypertension     Patient Active Problem List   Diagnosis Date Noted  . S/P cesarean section 02/20/2014  . Gestational hypertension 02/17/2014  . Group B Streptococcus carrier, +RV culture, currently pregnant 02/04/2014  . ? of Benign essential hypertension antepartum 01/25/2014  . Supervision of high-risk pregnancy 09/28/2013  . Tobacco use disorder 01/08/2013  . Obesity (BMI 30-39.9) 01/08/2013  . Type II or unspecified type diabetes mellitus without mention of complication, uncontrolled 01/07/2013  . Preexisting diabetes complicating  pregnancy, antepartum 11/07/2012    Past Surgical History  Procedure Laterality Date  . Cesarean section N/A 02/20/2014    Procedure: CESAREAN SECTION;  Surgeon: Lazaro Arms, MD;  Location: [facility];  Service: Obstetrics;  Laterality: N/A;    Current Outpatient Rx  Name  Route  Sig  Dispense  Refill  currently only taking ibuprofen and Tylenol occasionally for headache.                                                                                                     Allergies Review of patient's allergies indicates no known allergies.  Family History  Problem Relation Age of Onset  . Diabetes Mellitus I Mother   . Hypertension Mother   . Diabetes Mother   . Hypertension Father   . Cancer Neg Hx   . Early death Neg Hx   . Heart disease Neg Hx   . Hyperlipidemia Neg Hx   . Kidney disease Neg Hx   . Stroke Neg Hx     Social History Social History  Substance Use Topics  . Smoking status: Former Smoker -- 0.00 packs/day for 0 years  Quit date: 06/11/2009  . Smokeless tobacco: Never Used  . Alcohol Use: 2.4 oz/week    2 Glasses of wine, 2 Cans of beer per week     Comment: occasional; stopped after found out she was pregnant    Review of Systems Constitutional: No fever/chills Eyes: No visual changes. ENT: No sore throat. pain in the right upper jaw off-and-on, occasional and sharp. Denies trouble eating. No facial swelling. No trouble swallowing. No redness of the face Cardiovascular: Denies chest pain. Respiratory: Denies shortness of breath. Gastrointestinal: No abdominal pain.  No nausea, no vomiting.  No diarrhea.  No constipation. Genitourinary: Negative for dysuria. Musculoskeletal: Negative for back pain. Skin: Negative for rash. Neurological: Negative for headaches, focal weakness or numbness.  10-point ROS otherwise negative.  ____________________________________________   PHYSICAL EXAM:  VITAL SIGNS: ED Triage Vitals  Enc Vitals Group      BP 10/10/15 1021 154/106 mmHg     Pulse Rate 10/10/15 1021 93     Resp 10/10/15 1021 16     Temp 10/10/15 1021 98.3 F (36.8 C)     Temp Source 10/10/15 1021 Oral     SpO2 10/10/15 1021 95 %     Weight --      Height --      Head Cir --      Peak Flow --      Pain Score 10/10/15 1024 8     Pain Loc --      Pain Edu? --      Excl. in GC? --    Constitutional: Alert and oriented. Well appearing and in no acute distress. Eyes: Conjunctivae are normal. PERRL. EOMI. Head: Atraumatic. Nose: No congestion/rhinnorhea. Mouth/Throat: Mucous membranes are moist.  Oropharynx non-erythematous. no evidence of any erythema, edema or lesions in the mouth. The maxilla is normal without any swelling or erythema. No swelling erythema or tenderness of the jaw. She does have some minimal tenderness over what appears to be an old tooth extraction site, which she reports was done 3 weeks ago in the back right mandible in the area of what would be about the wisdom teeth. There is however no evidence of edema, fistula tract, or gum erythema to suggest infection. Neck: No stridor.  no meningismus. Cardiovascular: Normal rate, regular rhythm. Grossly normal heart sounds.  Good peripheral circulation. Respiratory: Normal respiratory effort.  No retractions. Lungs CTAB. Gastrointestinal: Soft and nontender. No distention. No abdominal bruits. No CVA tenderness. Musculoskeletal: No lower extremity tenderness nor edema.  Neurologic:  Normal speech and language. No gross focal neurologic deficits are appreciated. No gait instability. Skin:  Skin is warm, dry and intact. No rash noted. Psychiatric: Mood and affect are normal. Speech and behavior are normal.  ____________________________________________   LABS (all labs ordered are listed, but only abnormal results are displayed)  Labs Reviewed  GLUCOSE, CAPILLARY - Abnormal; Notable for the following:    Glucose-Capillary 324 (*)    All other components within  normal limits  CBC - Abnormal; Notable for the following:    MCV 78.2 (*)    MCH 25.8 (*)    All other components within normal limits  BASIC METABOLIC PANEL - Abnormal; Notable for the following:    Glucose, Bld 334 (*)    All other components within normal limits  URINALYSIS COMPLETEWITH MICROSCOPIC ([facility] ONLY) - Abnormal; Notable for the following:    Color, Urine STRAW (*)    APPearance CLEAR (*)    Glucose, UA >500 (*)  Hgb urine dipstick 3+ (*)    All other components within normal limits  POCT PREGNANCY, URINE   ____________________________________________  EKG   ____________________________________________  RADIOLOGY   ____________________________________________   PROCEDURES  Procedure(s) performed: None  Critical Care performed: No  ____________________________________________   INITIAL IMPRESSION / ASSESSMENT AND PLAN / ED COURSE  Pertinent labs & imaging results that were available during my care of the patient were reviewed by me and considered in my medical decision making (see chart for details).  Patient presents for evaluation of 3 weeks of occasional sharp headache. Seems to be radiating up from area where she had a recent dental extraction. There is no evidence of infection at this time, and I question if she could've had a slight dry socket or potential small exposed root. I discussed with her and recommended she follow up with her dentist again, and she is agreeable with that. I will not treat her with any antibiotics. She reports her symptoms are better with over-the-counter medication. She has no evidence of neurologic deficits, no neurologic symptoms, no fever, neck stiffness or pain to suggest a concerning etiology for her headache at this time.  In addition her blood sugar is notably elevated, normal anion gap. She was previously on 1 g metformin twice a day plus insulin while pregnant, we will restart her on her metformin 1000 mg twice a day  and she will arrange follow-up with a Medicaid provider whom she is selected but is awaiting on final cart approval. In the meantime she has been given and clinic information for the medication assistance program, as well as knows that she can return to the ER anytime. We discussed come back to ER right away if she develops fever, severe headache, nausea and vomiting, any confusion, weakness, frequent urination, sweats, or other new concerns arise. She is also agreeable to trying a glucometer and checking her blood sugar in the morning and before going to bed each night. ____________________________________________   FINAL CLINICAL IMPRESSION(S) / ED DIAGNOSES  Final diagnoses:  Pain, dental  Type 2 diabetes mellitus with hyperglycemia, without long-term current use of insulin (HCC)      Sharyn Creamer, MD 10/10/15 1439

## 2015-10-10 NOTE — Discharge Instructions (Signed)
You have been seen in the Emergency Department (ED) today for dental pain.   You should also take over-the-counter pain medication such as ibuprofen according to the label instructions unless a doctor has previously told you to avoid this type of medication (due to stomach ulcers, for example).  Please see you dentist as soon as possible; only a dentist will be able to fix your problem(s).  Please see below for dental follow up options.  Return to the ED if you develop worsening pain, fever, pus/drainage, difficulty breathing, or other symptoms that concern you.  OPTIONS FOR DENTAL FOLLOW UP CARE  [place] Department of Health and Human Services - Local Safety Net Dental Clinics [email or web address]   [facility] [phone])  [facility] [phone])  [place] [phone])  [facility] [phone])  [facility] [phone]) This clinic caters to the indigent population and is on a lottery system. Location: [employer] [facility], [employer], [address] [place] [facility] Hours: Wednesdays from 6pm - 9pm, patients seen by a lottery system. For dates, call or go to [email or web address] Services: Cleanings, fillings and simple extractions. Payment Options: DENTAL WORK IS FREE OF CHARGE. Bring proof of income or support. Best way to get seen: Arrive at 5:15 pm - this is a lottery, NOT first come/first serve, so arriving earlier will not increase your chances of being seen.     [facility] [phone] Select option 1 for emergencies   Location: [name], [place], [address], [place] [facility] Hours: No walk-ins accepted - call the day before to schedule an appointment. Check in times are 9:30 am and 1:30 pm. Services: Simple extractions, temporary fillings, pulpectomy/pulp  debridement, uncomplicated abscess drainage. Payment Options: PAYMENT IS DUE AT THE TIME OF SERVICE.  Fee is usually $100-200, additional surgical procedures (e.g. abscess drainage) may be extra. Cash, checks, Visa/MasterCard accepted.  Can file Medicaid if patient is covered for dental - patient should call case worker to check. No discount for [facility] patients. Best way to get seen: MUST call the day before and get onto the schedule. Can usually be seen the next 1-2 days. No walk-ins accepted.     [facility] [phone]   Location: [facility], [address], [place] Clinic Hours: M, W, Th, F 8am or 1:30pm, Tues 9a or 1:30 - first come/first served. Services: Simple extractions, temporary fillings, uncomplicated abscess drainage.  You do not need to be an [facility] resident. Payment Options: PAYMENT IS DUE AT THE TIME OF SERVICE. Dental insurance, otherwise sliding scale - bring proof of income or support. Depending on income and treatment needed, cost is usually $50-200. Best way to get seen: Arrive early as it is first come/first served.     [facility] [phone]   Location: [address] Clinic Hours: Mon-Thu 8a-5p Services: Most basic dental services including extractions and fillings. Payment Options: PAYMENT IS DUE AT THE TIME OF SERVICE. Sliding scale, up to 50% off - bring proof if income or support. Medicaid with dental option accepted. Best way to get seen: Call to schedule an appointment, can usually be seen within 2 weeks OR they will try to see walk-ins - show up at 8a or 2p (you may have to wait).     [facility] [phone] [place] RESIDENTS ONLY   Location: [facility], [address], [place], [place] [id number] Clinic Hours: By appointment only. Monday - Thursday 8am-5pm, Friday 8am-12pm Services: Cleanings,  fillings, extractions. Payment Options: PAYMENT IS DUE AT THE TIME OF SERVICE. Cash, Visa or MasterCard. Sliding scale - $30 minimum per service. Best way to get seen: Come in to office, complete packet and make an appointment - need proof of income or support monies for each household member and proof of [place] residence. Usually takes about a month to get in.     [facility] [phone]   Location: [address] [facility] Hours: Walk-in Urgent Care Dental Services are offered Monday-Friday mornings only. The numbers of emergencies accepted daily is limited to the number of providers available. Maximum 15 - Mondays, Wednesdays & Thursdays Maximum 10 - Tuesdays & Fridays Services: You do not need to be a [place] resident to be seen for a dental emergency. Emergencies are defined as pain, swelling, abnormal bleeding, or dental trauma. Walkins will receive x-rays if needed. NOTE: Dental cleaning is not an emergency. Payment Options: PAYMENT IS DUE AT THE TIME OF SERVICE. Minimum co-pay is $40.00 for uninsured patients. Minimum co-pay is $3.00 for Medicaid with dental coverage. Dental Insurance is accepted and must be presented at time of visit. Medicare does not cover dental. Forms of payment: Cash, credit card, checks. Best way to get seen: If not previously registered with the clinic, walk-in dental registration begins at 7:15 am and is on a first come/first serve basis. If previously registered with the clinic, call to make an appointment.     [facility] [phone] [place] RESIDENTS ONLY   Location: [address], [place], [place] Clinic Hours: Mon-Thu 10a-2p Services: Extractions only! Payment Options: FREE (donations accepted) - bring proof of income or support Best way to get seen: Call and schedule an appointment OR come at 8am on the 1st Monday of every month (except for holidays) when it is  first come/first served.     [facility] [phone]   Location: [address], [place] [facility] Hours: Friday mornings Services, Payment Options, Best way to get seen: Call for info     Type 2 Diabetes Mellitus, Adult Type 2 diabetes mellitus, often simply referred to as type 2 diabetes, is a long-lasting (chronic) disease. In type 2 diabetes, the pancreas does not make enough insulin (a hormone), the cells are less responsive to the insulin that is made (insulin resistance), or both. Normally, insulin moves sugars from food into the tissue cells. The tissue cells use the sugars for energy. The lack of insulin or the lack of normal response to insulin causes excess sugars to build up in the blood instead of going into the tissue cells. As a result, high blood sugar (hyperglycemia) develops. The effect of high sugar (glucose) levels can cause many complications. Type 2 diabetes was also previously called adult-onset diabetes, but it can occur at any age.  RISK FACTORS  A person is predisposed to developing type 2 diabetes if someone in the family has the disease and also has one or more of the following primary risk factors:  Weight gain, or being overweight or obese.  An inactive lifestyle.  A history of consistently eating high-calorie foods. Maintaining a normal weight and regular physical activity can reduce the chance of developing type 2 diabetes. SYMPTOMS  A person with type 2 diabetes may not show symptoms initially. The symptoms of type 2 diabetes appear slowly. The symptoms include:  Increased thirst (polydipsia).  Increased urination (polyuria).  Increased urination during the night (nocturia).  Sudden or unexplained weight changes.  Frequent, recurring infections.  Tiredness (fatigue).  Weakness.  Vision changes, such as blurred vision.  Fruity smell to your breath.  Abdominal pain.  Nausea or vomiting.  Cuts or bruises which are slow to  heal.  Tingling or numbness in the hands or feet.  An open skin wound (ulcer). DIAGNOSIS Type 2 diabetes is frequently not diagnosed until complications of diabetes are present. Type 2 diabetes is diagnosed when symptoms or complications are present and when blood glucose levels are increased. Your blood glucose level may be checked by one or more of the following blood tests:  A fasting blood glucose test. You will not be allowed to eat for at least 8 hours before a blood sample is taken.  A random blood glucose test. Your blood glucose is checked at any time of the day regardless of when you ate.  A hemoglobin A1c blood glucose test. A hemoglobin A1c test provides information about blood glucose control over the previous 3 months.  An oral glucose tolerance test (OGTT). Your blood glucose is measured after you have not eaten (fasted) for 2 hours and then after you drink a glucose-containing beverage. TREATMENT   You may need to take insulin or diabetes medicine daily to keep blood glucose levels in the desired range.  If you use insulin, you may need to adjust the dosage depending on the carbohydrates that you eat with each meal or snack.  Lifestyle changes are recommended as part of your treatment. These may include:  Following an individualized diet plan developed by a nutritionist or dietitian.  Exercising daily. Your health care providers will set individualized treatment goals for you based on your age, your medicines, how long you have had diabetes, and any other medical conditions you have. Generally, the goal of treatment is to maintain the following blood glucose levels:  Before meals (preprandial): 80-130 mg/dL.  After meals (postprandial): below 180 mg/dL.  A1c: less than 6.5-7%. HOME CARE INSTRUCTIONS   Have your hemoglobin A1c level checked twice a year.  Perform daily blood glucose monitoring as directed by your health care provider.  Monitor urine ketones when  you are ill and as directed by your health care provider.  Take your diabetes medicine or insulin as directed by your health care provider to maintain your blood glucose levels in the desired range.  Never run out of diabetes medicine or insulin. It is needed every day.  If you are using insulin, you may need to adjust the amount of insulin given based on your intake of carbohydrates. Carbohydrates can raise blood glucose levels but need to be included in your diet. Carbohydrates provide vitamins, minerals, and fiber which are an essential part of a healthy diet. Carbohydrates are found in fruits, vegetables, whole grains, dairy products, legumes, and foods containing added sugars.  Eat healthy foods. You should make an appointment to see a registered dietitian to help you create an eating plan that is right for you.  Lose weight if you are overweight.  Carry a medical alert card or wear your medical alert jewelry.  Carry a 15-gram carbohydrate snack with you at all times to treat low blood glucose (hypoglycemia). Some examples of 15-gram carbohydrate snacks include:  Glucose tablets, 3 or 4.  Glucose gel, 15-gram tube.  Raisins, 2 tablespoons (24 grams).  Jelly beans, 6.  Animal crackers, 8.  Regular pop,  4 ounces (120 mL).  Gummy treats, 9.  Recognize hypoglycemia. Hypoglycemia occurs with blood glucose levels of 70 mg/dL and below. The risk for hypoglycemia increases when fasting or skipping meals, during or after intense exercise, and during sleep. Hypoglycemia symptoms can include:  Tremors or shakes.  Decreased ability to concentrate.  Sweating.  Increased heart rate.  Headache.  Dry mouth.  Hunger.  Irritability.  Anxiety.  Restless sleep.  Altered speech or coordination.  Confusion.  Treat hypoglycemia promptly. If you are alert and able to safely swallow, follow the 15:15 rule:  Take 15-20 grams of rapid-acting glucose or carbohydrate. Rapid-acting  options include glucose gel, glucose tablets, or 4 ounces (120 mL) of fruit juice, regular soda, or low-fat milk.  Check your blood glucose level 15 minutes after taking the glucose.  Take 15-20 grams more of glucose if the repeat blood glucose level is still 70 mg/dL or below.  Eat a meal or snack within 1 hour once blood glucose levels return to normal.  Be alert to feeling very thirsty and urinating more frequently than usual, which are early signs of hyperglycemia. An early awareness of hyperglycemia allows for prompt treatment. Treat hyperglycemia as directed by your health care provider.  Engage in at least 150 minutes of moderate-intensity physical activity a week, spread over at least 3 days of the week or as directed by your health care provider. In addition, you should engage in resistance exercise at least 2 times a week or as directed by your health care provider. Try to spend no more than 90 minutes at one time inactive.  Adjust your medicine and food intake as needed if you start a new exercise or sport.  Follow your sick-day plan anytime you are unable to eat or drink as usual.  Do not use any tobacco products including cigarettes, chewing tobacco, or electronic cigarettes. If you need help quitting, ask your health care provider.  Limit alcohol intake to no more than 1 drink per day for nonpregnant women and 2 drinks per day for men. You should drink alcohol only when you are also eating food. Talk with your health care provider whether alcohol is safe for you. Tell your health care provider if you drink alcohol several times a week.  Keep all follow-up visits as directed by your health care provider. This is important.  Schedule an eye exam soon after the diagnosis of type 2 diabetes and then annually.  Perform daily skin and foot care. Examine your skin and feet daily for cuts, bruises, redness, nail problems, bleeding, blisters, or sores. A foot exam by a health care  provider should be done annually.  Brush your teeth and gums at least twice a day and floss at least once a day. Follow up with your dentist regularly.  Share your diabetes management plan with your workplace or school.  Keep your immunizations up to date. It is recommended that you receive a flu (influenza) vaccine every year. It is also recommended that you receive a pneumonia (pneumococcal) vaccine. If you are 30 years of age or older and have never received a pneumonia vaccine, this vaccine may be given as a series of two separate shots. Ask your health care provider which additional vaccines may be recommended.  Learn to manage stress.  Obtain ongoing diabetes education and support as needed.  Participate in or seek rehabilitation as needed to maintain or improve independence and quality of life. Request a physical or occupational therapy referral if you  are having foot or hand numbness, or difficulties with grooming, dressing, eating, or physical activity. SEEK MEDICAL CARE IF:   You are unable to eat food or drink fluids for more than 6 hours.  You have nausea and vomiting for more than 6 hours.  Your blood glucose level is over 240 mg/dL.  There is a change in mental status.  You develop an additional serious illness.  You have diarrhea for more than 6 hours.  You have been sick or have had a fever for a couple of days and are not getting better.  You have pain during any physical activity.  SEEK IMMEDIATE MEDICAL CARE IF:  You have difficulty breathing.  You have moderate to large ketone levels.   This information is not intended to replace advice given to you by your health care provider. Make sure you discuss any questions you have with your health care provider.   Document Released: 05/28/2005 Document Revised: 02/16/2015 Document Reviewed: 12/25/2011 Elsevier Interactive Patient Education [employer]

## 2015-10-10 NOTE — ED Notes (Signed)
Pt informed to return if any life threatening symptoms occur.  

## 2016-01-24 ENCOUNTER — Encounter: Payer: Self-pay | Admitting: Obstetrics & Gynecology

## 2016-01-24 ENCOUNTER — Other Ambulatory Visit (HOSPITAL_COMMUNITY)
Admission: RE | Admit: 2016-01-24 | Discharge: 2016-01-24 | Disposition: A | Discharge status: Home / Self Care | Payer: Medicaid Other | Source: Ambulatory Visit | Attending: Obstetrics & Gynecology | Admitting: Obstetrics & Gynecology

## 2016-01-24 ENCOUNTER — Ambulatory Visit (INDEPENDENT_AMBULATORY_CARE_PROVIDER_SITE_OTHER): Payer: Medicaid Other | Admitting: Obstetrics & Gynecology

## 2016-01-24 VITALS — BP 124/86 | HR 98 | Wt 232.0 lb

## 2016-01-24 DIAGNOSIS — O99210 Obesity complicating pregnancy, unspecified trimester: Secondary | ICD-10-CM | POA: Insufficient documentation

## 2016-01-24 DIAGNOSIS — O34219 Maternal care for unspecified type scar from previous cesarean delivery: Secondary | ICD-10-CM

## 2016-01-24 DIAGNOSIS — O24311 Unspecified pre-existing diabetes mellitus in pregnancy, first trimester: Secondary | ICD-10-CM

## 2016-01-24 DIAGNOSIS — Z113 Encounter for screening for infections with a predominantly sexual mode of transmission: Secondary | ICD-10-CM | POA: Insufficient documentation

## 2016-01-24 DIAGNOSIS — O24911 Unspecified diabetes mellitus in pregnancy, first trimester: Secondary | ICD-10-CM | POA: Diagnosis not present

## 2016-01-24 DIAGNOSIS — Z36 Encounter for antenatal screening of mother: Secondary | ICD-10-CM

## 2016-01-24 DIAGNOSIS — Z3689 Encounter for other specified antenatal screening: Secondary | ICD-10-CM

## 2016-01-24 DIAGNOSIS — O09891 Supervision of other high risk pregnancies, first trimester: Secondary | ICD-10-CM | POA: Diagnosis not present

## 2016-01-24 DIAGNOSIS — O0991 Supervision of high risk pregnancy, unspecified, first trimester: Secondary | ICD-10-CM

## 2016-01-24 DIAGNOSIS — O10911 Unspecified pre-existing hypertension complicating pregnancy, first trimester: Secondary | ICD-10-CM | POA: Diagnosis not present

## 2016-01-24 DIAGNOSIS — O99211 Obesity complicating pregnancy, first trimester: Secondary | ICD-10-CM

## 2016-01-24 DIAGNOSIS — E119 Type 2 diabetes mellitus without complications: Secondary | ICD-10-CM | POA: Insufficient documentation

## 2016-01-24 DIAGNOSIS — E669 Obesity, unspecified: Secondary | ICD-10-CM

## 2016-01-24 MED ORDER — NIFEDIPINE ER OSMOTIC RELEASE 30 MG PO TB24: 30.0000 mg | ORAL_TABLET | Freq: Two times a day (BID) | ORAL | 2 refills | Status: DC

## 2016-01-24 MED ORDER — ACCU-CHEK SOFTCLIX LANCET DEV MISC: 12 refills | Status: DC

## 2016-01-24 MED ORDER — GLUCOSE BLOOD VI STRP: ORAL_STRIP | 12 refills | Status: DC

## 2016-01-24 MED ORDER — ASPIRIN EC 81 MG PO TBEC: 81.0000 mg | DELAYED_RELEASE_TABLET | Freq: Every day | ORAL | 2 refills | Status: DC

## 2016-01-24 MED ORDER — INSULIN GLARGINE 100 UNITS/ML SOLOSTAR PEN: 15.0000 [IU] | PEN_INJECTOR | Freq: Every day | SUBCUTANEOUS | 5 refills | Status: DC

## 2016-01-24 NOTE — Progress Notes (Signed)
Subjective:    Joyce Green is a 30 y.o.G2P1001 at 2035w3d being seen today for her first obstetrical visit.  Her obstetrical history is significant for CHTN, Type II DM, previous cesarean section for NRFHT. Patient does intend to breast feed. Pregnancy history fully reviewed.  Patient reports no complaints.  Vitals:   01/24/16 1056  BP: 124/86  Pulse: 98  Weight: 232 lb (105.2 kg)    HISTORY: OB History  Gravida Para Term Preterm AB Living  2 1 1     1   SAB TAB Ectopic Multiple Live Births          1    # Outcome Date GA Lbr Len/2nd Weight Sex Delivery Anes PTL Lv  2 Current           1 Term 02/20/14 2083w2d 08:40 / 07:36 6 lb 1 oz (2.75 kg) F CS-LTranv EPI  LIV     Complications: Fetal Intolerance     Past Medical History:  Diagnosis Date  . Diabetes mellitus without complication (HCC)   . Hypertension   . Palpitations    Past Surgical History:  Procedure Laterality Date  . CESAREAN SECTION N/A 02/20/2014   Procedure: CESAREAN SECTION;  Surgeon: Lazaro ArmsLuther H Eure, MD;  Location: [facility];  Service: Obstetrics;  Laterality: N/A;   Family History  Problem Relation Age of Onset  . Diabetes Mellitus I Mother   . Hypertension Mother   . Diabetes Mother   . Hypertension Father   . Cancer Neg Hx   . Early death Neg Hx   . Heart disease Neg Hx   . Hyperlipidemia Neg Hx   . Kidney disease Neg Hx   . Stroke Neg Hx      Exam    Uterus:     Pelvic Exam: Deferred  System: Breast:  Deferred   Skin: normal coloration and turgor, no rashes   Neurologic: oriented, normal, negative, normal mood   Extremities: normal strength, tone, and muscle mass   HEENT PERRLA, extra ocular movement intact and sclera clear, anicteric   Mouth/Teeth mucous membranes moist, pharynx normal without lesions and dental hygiene good   Neck supple and no masses   Cardiovascular: regular rate and rhythm   Respiratory:  appears well, vitals normal, no respiratory distress, acyanotic, normal RR,  chest clear, no wheezing, crepitations, rhonchi, normal symmetric air entry   Abdomen: soft, non-tender; bowel sounds normal; no masses,  no organomegaly      Assessment and Plan:    Pregnancy: G2P1001 at 4435w3d  1. Preexisting diabetes complicating pregnancy, antepartum, first trimester Discussed implications of DM in pregnancy, need for optimizing glycemic control to decrease DM associated maternal-fetal morbidity and mortality, need for antenatal testing and frequent ultrasounds/prenatal visits. Will check baseline labs today. - aspirin EC 81 MG tablet; Take 1 tablet (81 mg total) by mouth daily. Take after 12 weeks for prevention of preeclampsia later in pregnancy  Dispense: 300 tablet; Refill: 2 - Comprehensive metabolic panel - Hemoglobin A1c - TSH - Protein / creatinine ratio, urine - Lancet Devices (ACCU-CHEK SOFTCLIX) lancets; Use as instructed  Dispense: 1 each; Refill: 12 - glucose blood test strip; Use as instructed  Dispense: 100 each; Refill: 12 - insulin glargine (LANTUS) 100 unit/mL SOPN; Inject 0.15 mLs (15 Units total) into the skin at bedtime.  Dispense: 15 mL; Refill: 5 - US MFM OB DETAIL +14 WK; Future - US Fetal Echocardigraphy - Referral to Nutrition and Diabetes Services  2. Preexisting hypertension complicating pregnancy,  antepartum, first trimester Discussed implications of CHTN in pregnancy, need for antenatal testing and frequent ultrasounds/prenatal visits, need for optimizing BP control to decrease CHTN/preeclampsia associated maternal-fetal morbidity and mortality. Discontinued her Lisinopril and HCTZ, replaced with Procardia. - NIFEdipine (PROCARDIA-XL/ADALAT-CC/NIFEDICAL-XL) 30 MG 24 hr tablet; Take 1 tablet (30 mg total) by mouth 2 (two) times daily.  Dispense: 60 tablet; Refill: 2 - aspirin EC 81 MG tablet; Take 1 tablet (81 mg total) by mouth daily. Take after 12 weeks for prevention of preeclampsia later in pregnancy  Dispense: 300 tablet; Refill: 2 -  Comprehensive metabolic panel - Hemoglobin A1c - TSH - Protein / creatinine ratio, urine - US MFM OB DETAIL +14 WK; Future  3. Obesity in pregnancy, antepartum, first trimester - Referral to Nutrition and Diabetes Services  4. Encounter for fetal anatomic survey Anatomy scan ordered - US MFM OB DETAIL +14 WK; Future  5. Previous cesarean delivery for fetal intolerance of labor, antepartum Leaning towards TOLAC; form given to her to review at home.  6. Supervision of high-risk pregnancy, first trimester - Prenatal Vit-Fe Fumarate-FA (PRENATAL COMPLETE PO); Take 1 tablet by mouth daily. - Prenatal Profile - Pain Mgmt, Profile 6 Conf w/o mM, U - GC/Chlamydia probe amp ([place] at [facility] - Hemoglobinopathy Evaluation - Culture, OB Urine - US bedside; Future Problem list reviewed and updated. Genetic Screening discussed Quad Screen: to be ordered next visit. Ultrasound discussed; fetal survey: ordered. Routine obstetric precautions reviewed. Follow up in 4 weeks.  Tereso NewcomerANYANWU,Jaretzy Lhommedieu A, MD 01/24/2016

## 2016-01-24 NOTE — Patient Instructions (Signed)
Thank you for enrolling in MyChart. Please follow the instructions below to securely access your online medical record. MyChart allows you to send messages to your doctor, view your test results, manage appointments, and more.   How Do I Sign Up? 1. In your Internet browser, go to [employer] and enter [email or web address]. 2. Click on the Sign Up Now link in the Sign In box. You will see the New Member Sign Up page. 3. Enter your MyChart Access Code exactly as it appears below. You will not need to use this code after you've completed the sign-up process. If you do not sign up before the expiration date, you must request a new code.  MyChart Access Code: [email or web address] Expires: 02/12/2016  2:53 PM  4. Enter your Social Security Number ([id number]) and Date of Birth (mm/dd/yyyy) as indicated and click Submit. You will be taken to the next sign-up page. 5. Create a MyChart ID. This will be your MyChart login ID and cannot be changed, so think of one that is secure and easy to remember. 6. Create a MyChart password. You can change your password at any time. 7. Enter your Password Reset Question and Answer. This can be used at a later time if you forget your password.  8. Enter your e-mail address. You will receive e-mail notification when new information is available in MyChart. 9. Click Sign Up. You can now view your medical record.   Additional Information Remember, MyChart is NOT to be used for urgent needs. For medical emergencies, dial 911.     Type 1 or Type 2 Diabetes Mellitus During Pregnancy Diabetes mellitus, often simply referred to as diabetes, is a long-term (chronic) disease. Type 1 diabetes occurs when the islet cells, which are in the pancreas and make the hormone insulin, are destroyed and can no longer make insulin. Type 2 diabetes occurs when the pancreas does not make enough insulin, the cells are less responsive to the insulin that is made (insulin  resistance), or both. Insulin is needed to move sugars from food into the tissue cells. The tissue cells use the sugars for energy. The lack of insulin or the lack of normal response to insulin causes excess sugars to build up in the blood instead of going into the tissue cells. As a result, high blood sugar (hyperglycemia) develops.  If blood glucose levels are kept in the normal range both before and during pregnancy, women can have a healthy pregnancy. If your blood glucose levels are not well controlled, there may be risks to you, your unborn baby, and your labor and delivery. Also, there may be risks to your baby once he or she is born.  RISK FACTORS  You are predisposed to developing type 1 diabetes if someone in your family has diabetes and you are exposed to certain environmental triggers.  You have an increased chance of developing type 2 diabetes if you have a family history of diabetes and also have one or more of the following risk factors:  Being overweight.  Having an inactive lifestyle.  Having a history of consistently eating high-calorie foods. SYMPTOMS  Increased thirst (polydipsia).  Increased urination (polyuria).  Increased urination during the night (nocturia).  Weight loss. This weight loss may be rapid.  Frequent, recurring infections.  Tiredness (fatigue).  Weakness.  Vision changes, such as blurred vision.  Fruity smell to your breath.  Abdominal pain.  Nausea or vomiting. DIAGNOSIS  If you have risk factors for diabetes, you may be screened  for undiagnosed type 2 diabetes at your first prenatal visit. If you have previously given birth and you had gestational diabetes, you should be screened. The screening should be performed 6-12 weeks after the child is born and repeated every 1-3 years after the first test. Diabetes is diagnosed when blood glucose levels are increased. Your blood glucose level may be checked by one or more of the following blood  tests:  A fasting blood glucose test. You will not be allowed to eat for at least 8 hours before a blood sample is taken.  A random blood glucose test. Your blood glucose is checked at any time of the day regardless of when you ate.  A hemoglobin A1c blood glucose test. A hemoglobin A1c test provides information about blood glucose control over the previous 3 months.  An oral glucose tolerance test (OGTT). Your blood glucose is measured after you have not eaten (fasted) for 1-3 hours and then after you drink a glucose-containing beverage. An OGTT is usually performed during weeks 24-28 of your pregnancy. TREATMENT   You will need to take diabetes medicine or insulin daily to keep blood glucose levels in the desired range.  You will need to match insulin dosing with exercise and healthy food choices. If you have type 1 or type 2 diabetes, your treatment goal is to maintain the following blood glucose levels during pregnancy:  Before meals (preprandial), at bedtime, and overnight: 60-99 mg/dL.  After meals (postprandial): peak of 100-129 mg/dL.  A1c: less than 7%. HOME CARE INSTRUCTIONS   Have your hemoglobin A1c level checked twice a year.  Perform daily blood glucose monitoring as directed by your health care provider. It is common to perform frequent blood glucose monitoring.  Monitor urine ketones when you are sick and as directed by your health care provider.  Take your diabetes medicine and insulin as directed by your health care provider to maintain your blood glucose level in the desired range.  Never run out of diabetes medicine or insulin. It is needed every day.  Adjust insulin based on your intake of carbohydrates. Carbohydrates can raise blood glucose levels but need to be included in your diet. Carbohydrates provide vitamins, minerals, and fiber, which are an essential part of a healthy diet. Carbohydrates are found in fruits, vegetables, whole grains, dairy products,  legumes, and foods containing added sugars.  Eat healthy foods. Alternate 3 meals with 3 snacks.  Maintain a healthy weight gain. The usual total expected weight gain varies according to your prepregnancy body mass index (BMI).  Carry a medical alert card or wear medical alert jewelry.  Carry a 15-gram carbohydrate snack with you at all times to treat low blood sugar (hypoglycemia). Some examples of 15-gram carbohydrate snacks include:  Glucose tablets, 3 or 4.  Glucose gel, 15-gram tube.  Raisins, 2 Tbsp (24 grams).  Jelly beans, 6.  Animal crackers, 8.  Fruit juice, regular soda, or low-fat milk, 4 ounces (120 mL).  Gummy treats, 9.  Recognize hypoglycemia. Hypoglycemia during pregnancy occurs with blood glucose levels of 60 mg/dL and below. The risk for hypoglycemia increases when fasting or skipping meals, during or after intense exercise, and during sleep. Hypoglycemia symptoms can include:  Tremors or shakes.  Decreased ability to concentrate.  Sweating.  Increased heart rate.  Headache.  Dry mouth.  Hunger.  Irritability.  Anxiety.  Restless sleep.  Altered speech or coordination.  Confusion.  Treat hypoglycemia promptly. If you are alert and able to safely swallow,  follow the 15:15 rule:  Take 15-20 grams of rapid-acting glucose or carbohydrate. Rapid-acting options include glucose gel, glucose tablets, or 4 ounces (120 mL) of fruit juice, regular soda, or low-fat milk.  Check your blood glucose level 15 minutes after taking the glucose.  Take an additional 15-20 grams of glucose if the repeat blood glucose level is still 70 mg/dL or below.  Eat a meal or snack within 1 hour once blood glucose levels return to normal.  Engage in at least 30 minutes of physical activity a day or as directed by your health care provider. Ten minutes of physical activity timed 30 minutes after each meal is encouraged to control postprandial blood glucose  levels.  Watch for polyuria (excess urination) and polydipsia (feeling extra thirsty), which are early signs of hyperglycemia. An early awareness of hyperglycemia allows for prompt treatment. Treat hyperglycemia as directed by your health care provider.  Adjust your insulin dosing and food intake, as needed, if you start a new exercise or sport.  Follow your sick-day plan any time you are unable to eat or drink as usual.  Avoid tobacco and alcohol use.  Keep all follow-up visits as directed by your health care provider.  Follow the advice of your health care provider regarding your prenatal and post-delivery (postpartum) appointments, meal planning, exercise, medicines, vitamins, blood tests, other medical tests, and physical activities.  Continue daily skin and foot care. Examine your skin and feet daily for cuts, bruises, redness, nail problems, bleeding, blisters, or sores. A foot exam by a health care provider should be done annually.  Brush your teeth and gums at least twice a day and floss at least once a day. Follow up with your dentist regularly.  Schedule an eye exam during the first trimester of your pregnancy or as directed by your health care provider.  Share your diabetes management plan with your workplace or school.  Stay up-to-date with immunizations.  Learn to manage stress.  Obtain ongoing diabetes education and support as needed.  Your health care provider may recommend that you take one low-dose aspirin (81 mg) each day to help prevent high blood pressure during your pregnancy (preeclampsia or eclampsia). You may be at risk for preeclampsia or eclampsia if:  You had preeclampsia or eclampsia during a previous pregnancy.  Your baby did not grow as expected during a previous pregnancy.  You experienced preterm birth with a previous pregnancy.  You experienced a separation of the placenta from the uterus (placental abruption) during a previous pregnancy.  You  experienced the loss of your baby during a previous pregnancy.  You are pregnant with more than one baby.  You have other medical conditions, such as high blood pressure or autoimmune disease. SEEK MEDICAL CARE IF:   You are unable to eat food or drink fluids for more than 6 hours.  You have nausea and vomiting for more than 6 hours.  You have a blood glucose level of 200 mg/dL and you have ketones in your urine.  There is a change in mental status.  You develop vision problems.  You have a persistent headache.  You have upper abdominal pain or discomfort.  You have an additional serious sickness.  You have diarrhea for more than 6 hours.  You have been sick or have had a fever for 2 days and are not getting better. SEEK IMMEDIATE MEDICAL CARE IF:  You have difficulty breathing.  You no longer feel your baby moving.  You are bleeding  or have discharge from your vagina.  You start having premature contractions or labor. MAKE SURE YOU:  Understand these instructions.  Will watch your condition.  Will get help right away if you are not doing well or get worse.   This information is not intended to replace advice given to you by your health care provider. Make sure you discuss any questions you have with your health care provider.   Document Released: 02/20/2012 Document Revised: 06/18/2014 Document Reviewed: 02/20/2012 Elsevier Interactive Patient Education 2016 [employer].  Hypertension During Pregnancy Hypertension, or high blood pressure, is when there is extra pressure inside your blood vessels that carry blood from the heart to the rest of your body (arteries). It can happen at any time in life, including pregnancy. Hypertension during pregnancy can cause problems for you and your baby. Your baby might not weigh as much as he or she should at birth or might be born early (premature). Very bad cases of hypertension during pregnancy can be life-threatening.   Different types of hypertension can occur during pregnancy. These include:  Chronic hypertension. This happens when a woman has hypertension before pregnancy and it continues during pregnancy.  Gestational hypertension. This is when hypertension develops during pregnancy.  Preeclampsia or toxemia of pregnancy. This is a very serious type of hypertension that develops only during pregnancy. It affects the whole body and can be very dangerous for both mother and baby.  Gestational hypertension and preeclampsia usually go away after your baby is born. Your blood pressure will likely stabilize within 6 weeks. Women who have hypertension during pregnancy have a greater chance of developing hypertension later in life or with future pregnancies. RISK FACTORS There are certain factors that make it more likely for you to develop hypertension during pregnancy. These include:  Having hypertension before pregnancy.  Having hypertension during a previous pregnancy.  Being overweight.  Being older than 40 years.  Being pregnant with more than one baby.  Having diabetes or kidney problems. SIGNS AND SYMPTOMS Chronic and gestational hypertension rarely cause symptoms. Preeclampsia has symptoms, which may include:  Increased protein in your urine. Your health care provider will check for this at every prenatal visit.  Swelling of your hands and face.  Rapid weight gain.  Headaches.  Visual changes.  Being bothered by light.  Abdominal pain, especially in the upper right area.  Chest pain.  Shortness of breath.  Increased reflexes.  Seizures. These occur with a more severe form of preeclampsia, called eclampsia. DIAGNOSIS  You may be diagnosed with hypertension during a regular prenatal exam. At each prenatal visit, you may have:  Your blood pressure checked.  A urine test to check for protein in your urine. The type of hypertension you are diagnosed with depends on when you  developed it. It also depends on your specific blood pressure reading.  Developing hypertension before 20 weeks of pregnancy is consistent with chronic hypertension.  Developing hypertension after 20 weeks of pregnancy is consistent with gestational hypertension.  Hypertension with increased urinary protein is diagnosed as preeclampsia.  Blood pressure measurements that stay above 160 systolic or 110 diastolic are a sign of severe preeclampsia. TREATMENT Treatment for hypertension during pregnancy varies. Treatment depends on the type of hypertension and how serious it is.  If you take medicine for chronic hypertension, you may need to switch medicines.  Medicines called ACE inhibitors should not be taken during pregnancy.  Low-dose aspirin may be suggested for women who have risk factors  for preeclampsia.  If you have gestational hypertension, you may need to take a blood pressure medicine that is safe during pregnancy. Your health care provider will recommend the correct medicine.  If you have severe preeclampsia, you may need to be in the hospital. Health care providers will watch you and your baby very closely. You also may need to take medicine called magnesium sulfate to prevent seizures and lower blood pressure.  Sometimes, an early delivery is needed. This may be the case if the condition worsens. It would be done to protect you and your baby. The only cure for preeclampsia is delivery.  Your health care provider may recommend that you take one low-dose aspirin (81 mg) each day to help prevent high blood pressure during your pregnancy if you are at risk for preeclampsia. You may be at risk for preeclampsia if:  You had preeclampsia or eclampsia during a previous pregnancy.  Your baby did not grow as expected during a previous pregnancy.  You experienced preterm birth with a previous pregnancy.  You experienced a separation of the placenta from the uterus (placental abruption)  during a previous pregnancy.  You experienced the loss of your baby during a previous pregnancy.  You are pregnant with more than one baby.  You have other medical conditions, such as diabetes or an autoimmune disease. HOME CARE INSTRUCTIONS  Schedule and keep all of your regular prenatal care appointments. This is important.  Take medicines only as directed by your health care provider. Tell your health care provider about all medicines you take.  Eat as little salt as possible.  Get regular exercise.  Do not drink alcohol.  Do not use tobacco products.  Do not drink products with caffeine.  Lie on your left side when resting. SEEK IMMEDIATE MEDICAL CARE IF:  You have severe abdominal pain.  You have sudden swelling in your hands, ankles, or face.  You gain 4 pounds (1.8 kg) or more in 1 week.  You vomit repeatedly.  You have vaginal bleeding.  You do not feel your baby moving as much.  You have a headache.  You have blurred or double vision.  You have muscle twitching or spasms.  You have shortness of breath.  You have blue fingernails or lips.  You have blood in your urine. MAKE SURE YOU:  Understand these instructions.  Will watch your condition.  Will get help right away if you are not doing well or get worse.   This information is not intended to replace advice given to you by your health care provider. Make sure you discuss any questions you have with your health care provider.   Document Released: 02/13/2011 Document Revised: 06/18/2014 Document Reviewed: 12/25/2012 Elsevier Interactive Patient Education 2016 [employer]     Second Trimester of Pregnancy The second trimester is from week 13 through week 28, months 4 through 6. The second trimester is often a time when you feel your best. Your body has also adjusted to being pregnant, and you begin to feel better physically. Usually, morning sickness has lessened or quit completely, you may  have more energy, and you may have an increase in appetite. The second trimester is also a time when the fetus is growing rapidly. At the end of the sixth month, the fetus is about 9 inches long and weighs about 1 pounds. You will likely begin to feel the baby move (quickening) between 18 and 20 weeks of the pregnancy. BODY CHANGES Your body goes through  many changes during pregnancy. The changes vary from woman to woman.   Your weight will continue to increase. You will notice your lower abdomen bulging out.  You may begin to get stretch marks on your hips, abdomen, and breasts.  You may develop headaches that can be relieved by medicines approved by your health care provider.  You may urinate more often because the fetus is pressing on your bladder.  You may develop or continue to have heartburn as a result of your pregnancy.  You may develop constipation because certain hormones are causing the muscles that push waste through your intestines to slow down.  You may develop hemorrhoids or swollen, bulging veins (varicose veins).  You may have back pain because of the weight gain and pregnancy hormones relaxing your joints between the bones in your pelvis and as a result of a shift in weight and the muscles that support your balance.  Your breasts will continue to grow and be tender.  Your gums may bleed and may be sensitive to brushing and flossing.  Dark spots or blotches (chloasma, mask of pregnancy) may develop on your face. This will likely fade after the baby is born.  A dark line from your belly button to the pubic area (linea nigra) may appear. This will likely fade after the baby is born.  You may have changes in your hair. These can include thickening of your hair, rapid growth, and changes in texture. Some women also have hair loss during or after pregnancy, or hair that feels dry or thin. Your hair will most likely return to normal after your baby is born. WHAT TO EXPECT AT  YOUR PRENATAL VISITS During a routine prenatal visit:  You will be weighed to make sure you and the fetus are growing normally.  Your blood pressure will be taken.  Your abdomen will be measured to track your baby's growth.  The fetal heartbeat will be listened to.  Any test results from the previous visit will be discussed. Your health care provider may ask you:  How you are feeling.  If you are feeling the baby move.  If you have had any abnormal symptoms, such as leaking fluid, bleeding, severe headaches, or abdominal cramping.  If you are using any tobacco products, including cigarettes, chewing tobacco, and electronic cigarettes.  If you have any questions. Other tests that may be performed during your second trimester include:  Blood tests that check for:  Low iron levels (anemia).  Gestational diabetes (between 24 and 28 weeks).  Rh antibodies.  Urine tests to check for infections, diabetes, or protein in the urine.  An ultrasound to confirm the proper growth and development of the baby.  An amniocentesis to check for possible genetic problems.  Fetal screens for spina bifida and Down syndrome.  HIV (human immunodeficiency virus) testing. Routine prenatal testing includes screening for HIV, unless you choose not to have this test. HOME CARE INSTRUCTIONS   Avoid all smoking, herbs, alcohol, and unprescribed drugs. These chemicals affect the formation and growth of the baby.  Do not use any tobacco products, including cigarettes, chewing tobacco, and electronic cigarettes. If you need help quitting, ask your health care provider. You may receive counseling support and other resources to help you quit.  Follow your health care provider's instructions regarding medicine use. There are medicines that are either safe or unsafe to take during pregnancy.  Exercise only as directed by your health care provider. Experiencing uterine cramps is a good  sign to stop  exercising.  Continue to eat regular, healthy meals.  Wear a good support bra for breast tenderness.  Do not use hot tubs, steam rooms, or saunas.  Wear your seat belt at all times when driving.  Avoid raw meat, uncooked cheese, cat litter boxes, and soil used by cats. These carry germs that can cause birth defects in the baby.  Take your prenatal vitamins.  Take 1500-2000 mg of calcium daily starting at the 20th week of pregnancy until you deliver your baby.  Try taking a stool softener (if your health care provider approves) if you develop constipation. Eat more high-fiber foods, such as fresh vegetables or fruit and whole grains. Drink plenty of fluids to keep your urine clear or pale yellow.  Take warm sitz baths to soothe any pain or discomfort caused by hemorrhoids. Use hemorrhoid cream if your health care provider approves.  If you develop varicose veins, wear support hose. Elevate your feet for 15 minutes, 3-4 times a day. Limit salt in your diet.  Avoid heavy lifting, wear low heel shoes, and practice good posture.  Rest with your legs elevated if you have leg cramps or low back pain.  Visit your dentist if you have not gone yet during your pregnancy. Use a soft toothbrush to brush your teeth and be gentle when you floss.  A sexual relationship may be continued unless your health care provider directs you otherwise.  Continue to go to all your prenatal visits as directed by your health care provider. SEEK MEDICAL CARE IF:   You have dizziness.  You have mild pelvic cramps, pelvic pressure, or nagging pain in the abdominal area.  You have persistent nausea, vomiting, or diarrhea.  You have a bad smelling vaginal discharge.  You have pain with urination. SEEK IMMEDIATE MEDICAL CARE IF:   You have a fever.  You are leaking fluid from your vagina.  You have spotting or bleeding from your vagina.  You have severe abdominal cramping or pain.  You have rapid  weight gain or loss.  You have shortness of breath with chest pain.  You notice sudden or extreme swelling of your face, hands, ankles, feet, or legs.  You have not felt your baby move in over an hour.  You have severe headaches that do not go away with medicine.  You have vision changes.   This information is not intended to replace advice given to you by your health care provider. Make sure you discuss any questions you have with your health care provider.   Document Released: 05/22/2001 Document Revised: 06/18/2014 Document Reviewed: 07/29/2012 Elsevier Interactive Patient Education [employer]

## 2016-01-24 NOTE — Progress Notes (Signed)
Transvaginal US performed at bedside, SIUP noted with + FHR = 156 and HC measuring 12w 3d

## 2016-01-25 ENCOUNTER — Encounter: Payer: Self-pay | Admitting: *Deleted

## 2016-01-25 LAB — PRENATAL PROFILE (SOLSTAS)
ANTIBODY SCREEN: NEGATIVE
Basophils Absolute: 0 cells/uL (ref 0–200)
Basophils Relative: 0 %
EOS PCT: 3 %
Eosinophils Absolute: 195 cells/uL (ref 15–500)
HEMATOCRIT: 37.4 % (ref 35.0–45.0)
HEMOGLOBIN: 12.1 g/dL (ref 11.7–15.5)
HEP B S AG: NEGATIVE
HIV 1&2 Ab, 4th Generation: NONREACTIVE
LYMPHS ABS: 1235 {cells}/uL (ref 850–3900)
Lymphocytes Relative: 19 %
MCH: 26.2 pg — ABNORMAL LOW (ref 27.0–33.0)
MCHC: 32.4 g/dL (ref 32.0–36.0)
MCV: 81.1 fL (ref 80.0–100.0)
MONOS PCT: 5 %
MPV: 10.4 fL (ref 7.5–12.5)
Monocytes Absolute: 325 cells/uL (ref 200–950)
NEUTROS ABS: 4745 {cells}/uL (ref 1500–7800)
NEUTROS PCT: 73 %
Platelets: 197 10*3/uL (ref 140–400)
RBC: 4.61 MIL/uL (ref 3.80–5.10)
RDW: 15.6 % — AB (ref 11.0–15.0)
RUBELLA: 1.32 {index} — AB (ref <0.90)
Rh Type: POSITIVE
WBC: 6.5 10*3/uL (ref 3.8–10.8)

## 2016-01-25 LAB — COMPREHENSIVE METABOLIC PANEL
ALT: 9 U/L (ref 6–29)
AST: 13 U/L (ref 10–30)
Albumin: 4 g/dL (ref 3.6–5.1)
Alkaline Phosphatase: 52 U/L (ref 33–115)
BILIRUBIN TOTAL: 0.4 mg/dL (ref 0.2–1.2)
BUN: 6 mg/dL — AB (ref 7–25)
CHLORIDE: 105 mmol/L (ref 98–110)
CO2: 20 mmol/L (ref 20–31)
CREATININE: 0.58 mg/dL (ref 0.50–1.10)
Calcium: 8.7 mg/dL (ref 8.6–10.2)
GLUCOSE: 112 mg/dL — AB (ref 65–99)
Potassium: 3.9 mmol/L (ref 3.5–5.3)
SODIUM: 134 mmol/L — AB (ref 135–146)
Total Protein: 6.9 g/dL (ref 6.1–8.1)

## 2016-01-25 LAB — PAIN MGMT, PROFILE 6 CONF W/O MM, U
6 ACETYLMORPHINE: NEGATIVE ng/mL (ref <10)
ALCOHOL METABOLITES: NEGATIVE ng/mL (ref <500)
AMPHETAMINES: NEGATIVE ng/mL (ref <500)
BARBITURATES: NEGATIVE ng/mL (ref <300)
Benzodiazepines: NEGATIVE ng/mL (ref <100)
Cocaine Metabolite: NEGATIVE ng/mL (ref <150)
Creatinine: 90.2 mg/dL (ref >=20.0)
MARIJUANA METABOLITE: NEGATIVE ng/mL (ref <20)
Methadone Metabolite: NEGATIVE ng/mL (ref <100)
OPIATES: NEGATIVE ng/mL (ref <100)
OXYCODONE: NEGATIVE ng/mL (ref <100)
Oxidant: NEGATIVE ug/mL (ref <200)
PLEASE NOTE: 0
Phencyclidine: NEGATIVE ng/mL (ref <25)
pH: 6.84 (ref 4.5–9.0)

## 2016-01-25 LAB — HEMOGLOBIN A1C
Hgb A1c MFr Bld: 7.5 % — ABNORMAL HIGH (ref <5.7)
Mean Plasma Glucose: 169 mg/dL

## 2016-01-25 LAB — GC/CHLAMYDIA PROBE AMP (~~LOC~~) NOT AT ARMC
CHLAMYDIA, DNA PROBE: NEGATIVE
NEISSERIA GONORRHEA: NEGATIVE

## 2016-01-25 LAB — PROTEIN / CREATININE RATIO, URINE
Creatinine, Urine: 106 mg/dL (ref 20–320)
PROTEIN CREATININE RATIO: 94 mg/g{creat} (ref 21–161)
TOTAL PROTEIN, URINE: 10 mg/dL (ref 5–24)

## 2016-01-25 LAB — TSH: TSH: 2.74 mIU/L

## 2016-01-26 LAB — HEMOGLOBINOPATHY EVALUATION
HCT: 37.4 % (ref 35.0–45.0)
HGB A: 96.7 % (ref >96.0)
Hemoglobin: 12.1 g/dL (ref 11.7–15.5)
Hgb A2 Quant: 2.3 % (ref 1.8–3.5)
MCH: 26.2 pg — ABNORMAL LOW (ref 27.0–33.0)
MCV: 81.1 fL (ref 80.0–100.0)
RBC: 4.61 MIL/uL (ref 3.80–5.10)
RDW: 15.6 % — ABNORMAL HIGH (ref 11.0–15.0)

## 2016-01-26 LAB — CULTURE, OB URINE: Organism ID, Bacteria: NO GROWTH

## 2016-02-02 ENCOUNTER — Ambulatory Visit: Payer: Medicaid Other | Admitting: *Deleted

## 2016-02-22 ENCOUNTER — Encounter: Payer: Medicaid Other | Admitting: Obstetrics and Gynecology

## 2016-02-24 ENCOUNTER — Ambulatory Visit (INDEPENDENT_AMBULATORY_CARE_PROVIDER_SITE_OTHER): Payer: Medicaid Other | Admitting: Obstetrics and Gynecology

## 2016-02-24 VITALS — BP 126/87 | HR 120 | Wt 237.0 lb

## 2016-02-24 DIAGNOSIS — O10912 Unspecified pre-existing hypertension complicating pregnancy, second trimester: Secondary | ICD-10-CM

## 2016-02-24 DIAGNOSIS — O99212 Obesity complicating pregnancy, second trimester: Secondary | ICD-10-CM

## 2016-02-24 DIAGNOSIS — E669 Obesity, unspecified: Secondary | ICD-10-CM

## 2016-02-24 DIAGNOSIS — O24912 Unspecified diabetes mellitus in pregnancy, second trimester: Secondary | ICD-10-CM

## 2016-02-24 DIAGNOSIS — I10 Essential (primary) hypertension: Secondary | ICD-10-CM

## 2016-02-24 DIAGNOSIS — O0992 Supervision of high risk pregnancy, unspecified, second trimester: Secondary | ICD-10-CM

## 2016-02-24 DIAGNOSIS — Z23 Encounter for immunization: Secondary | ICD-10-CM

## 2016-02-24 DIAGNOSIS — O24312 Unspecified pre-existing diabetes mellitus in pregnancy, second trimester: Secondary | ICD-10-CM

## 2016-02-24 NOTE — Progress Notes (Signed)
   PRENATAL VISIT NOTE  Subjective:  Joyce Green is a 30 y.o. G2P1001 at 8535w6d being seen today for ongoing prenatal care.  She is currently monitored for the following issues for this high-risk pregnancy and has Preexisting diabetes complicating pregnancy, antepartum; Obesity (BMI 30-39.9); Supervision of high-risk pregnancy; Chronic hypertension complicating pregnancy, antepartum; Previous cesarean delivery for fetal intolerance of labor, antepartum; Hypertension; Obesity in pregnancy, antepartum; and Diabetes mellitus without complication (HCC) on her problem list.  Patient reports no complaints.  Contractions: Not present. Vag. Bleeding: None.  Movement: Absent. Denies leaking of fluid.   The following portions of the patient's history were reviewed and updated as appropriate: allergies, current medications, past family history, past medical history, past social history, past surgical history and problem list. Problem list updated.  Objective:   Vitals:   02/24/16 1041  BP: 126/87  Pulse: (!) 120  Weight: 237 lb (107.5 kg)    Fetal Status: Fetal Heart Rate (bpm): 157   Movement: Absent     General:  Alert, oriented and cooperative. Patient is in no acute distress.  Skin: Skin is warm and dry. No rash noted.   Cardiovascular: Normal heart rate noted  Respiratory: Normal respiratory effort, no problems with respiration noted  Abdomen: Soft, gravid, appropriate for gestational age. Pain/Pressure: Present     Pelvic:  Cervical exam deferred        Extremities: Normal range of motion.  Edema: None  Mental Status: Normal mood and affect. Normal behavior. Normal judgment and thought content.   Urinalysis:      Assessment and Plan:  Pregnancy: G2P1001 at 3635w6d  1. Pregnancy, supervision, high-risk, second trimester Patient is doing well without complaints Flu shot and quad screen today - Flu Vaccine QUAD 36+ mos IM (Fluarix, Quad PF) - AFP, Quad Screen  2. Preexisting  diabetes complicating pregnancy, antepartum, second trimester CBG reviewed and all out of range. Pp as high as 224. Some fasting as high as 160 and 202.  Patient admits to not adhering to the diet Increased Lantus to 18 units qHS. Discuss changing it to NPH but patient would like to make changes first Also discussed incorporating regular exercise in her regimen   4. Obesity in pregnancy, antepartum, second trimester   5. Essential hypertension   6. Preexisting hypertension complicating pregnancy, antepartum, second trimester Continue Norvasc and daily ASA  General obstetric precautions including but not limited to vaginal bleeding, contractions, leaking of fluid and fetal movement were reviewed in detail with the patient. Please refer to After Visit Summary for other counseling recommendations.  No Follow-up on file.  Catalina AntiguaPeggy Larin Depaoli, MD

## 2016-02-27 LAB — AFP, QUAD SCREEN
AFP: 20.6 ng/mL
Age Alone: 1:731 {titer}
Curr Gest Age: 16.9 weeks
HCG TOTAL: 48.4 [IU]/mL
INH: 178.3 pg/mL
Interpretation-AFP: NEGATIVE
MOM FOR AFP: 0.83
MOM FOR HCG: 1.8
MoM for INH: 1.31
Open Spina bifida: NEGATIVE
Osb Risk: 1:13900 {titer}
Tri 18 Scr Risk Est: NEGATIVE
uE3 Mom: 0.72
uE3 Value: 0.65 ng/mL

## 2016-03-06 ENCOUNTER — Encounter (HOSPITAL_COMMUNITY): Payer: Self-pay | Admitting: Obstetrics & Gynecology

## 2016-03-07 ENCOUNTER — Ambulatory Visit (INDEPENDENT_AMBULATORY_CARE_PROVIDER_SITE_OTHER): Payer: Medicaid Other | Admitting: Obstetrics & Gynecology

## 2016-03-07 DIAGNOSIS — O24311 Unspecified pre-existing diabetes mellitus in pregnancy, first trimester: Secondary | ICD-10-CM

## 2016-03-07 DIAGNOSIS — O0992 Supervision of high risk pregnancy, unspecified, second trimester: Secondary | ICD-10-CM

## 2016-03-07 DIAGNOSIS — O10912 Unspecified pre-existing hypertension complicating pregnancy, second trimester: Secondary | ICD-10-CM

## 2016-03-07 DIAGNOSIS — O24912 Unspecified diabetes mellitus in pregnancy, second trimester: Secondary | ICD-10-CM

## 2016-03-07 MED ORDER — INSULIN GLARGINE 100 UNITS/ML SOLOSTAR PEN: 17.0000 [IU] | PEN_INJECTOR | Freq: Every day | SUBCUTANEOUS | 5 refills | Status: DC

## 2016-03-07 MED ORDER — INSULIN PEN NEEDLE 31G X 5 MM MISC: 5 refills | Status: DC

## 2016-03-07 NOTE — Patient Instructions (Signed)
Return to clinic for any scheduled appointments or obstetric concerns, or go to [facility] for evaluation  

## 2016-03-07 NOTE — Progress Notes (Signed)
   PRENATAL VISIT NOTE  Subjective:  Joyce Green is a 30 y.o. G2P1001 at 6110w4d being seen today for ongoing prenatal care.  She is currently monitored for the following issues for this high-risk pregnancy and has Preexisting diabetes complicating pregnancy, antepartum; Obesity (BMI 30-39.9); Supervision of high-risk pregnancy; Chronic hypertension complicating pregnancy, antepartum; Previous cesarean delivery for fetal intolerance of labor, antepartum; Hypertension; Obesity in pregnancy, antepartum; and Diabetes mellitus without complication (HCC) on her problem list.  Patient reports fatigue.  Contractions: Not present. Vag. Bleeding: None.  Movement: Absent. Denies leaking of fluid.   The following portions of the patient's history were reviewed and updated as appropriate: allergies, current medications, past family history, past medical history, past social history, past surgical history and problem list. Problem list updated.  Objective:   Vitals:   03/07/16 0941  BP: 128/86  Pulse: (!) 103  Weight: 239 lb (108.4 kg)    Fetal Status: Fetal Heart Rate (bpm): 152   Movement: Absent     General:  Alert, oriented and cooperative. Patient is in no acute distress.  Skin: Skin is warm and dry. No rash noted.   Cardiovascular: Normal heart rate noted  Respiratory: Normal respiratory effort, no problems with respiration noted  Abdomen: Soft, gravid, appropriate for gestational age. Pain/Pressure: Absent     Pelvic:  Cervical exam deferred        Extremities: Normal range of motion.  Edema: None  Mental Status: Normal mood and affect. Normal behavior. Normal judgment and thought content.   Urinalysis: Urine Protein: Trace Urine Glucose: Negative CBGs: Fastings 90-100s, postprandials 110-150s (much improved from last visit).  Assessment and Plan:  Pregnancy: G2P1001 at 5710w4d  1. Preexisting diabetes complicating pregnancy, antepartum, first trimester Already scheduled for anatomy  scan, fetal ECHO also scheduled. Patient to schedule eye exam.  Increased Lantus from 15 to 17 units qhs for tighter glycemic control, continue Metformin. - insulin glargine (LANTUS) 100 unit/mL SOPN; Inject 0.17 mLs (17 Units total) into the skin at bedtime.  Dispense: 15 mL; Refill: 5 - Insulin Pen Needle 31G X 5 MM MISC; Use with Solostar as directed  Dispense: 100 each; Refill: 5  2. Preexisting hypertension complicating pregnancy, antepartum, second trimester Continue Procardia  XL as prescribed and ASA  3. Supervision of high-risk pregnancy, second trimester Advised fatigue is normal in pregnancy; had normal CBC and TSH recently. No other complaints or concerns.  Routine obstetric precautions reviewed. Please refer to After Visit Summary for other counseling recommendations.  Return in about 2 weeks (around 03/21/2016) for OB Visit.  Tereso NewcomerUgonna A Anyanwu, MD

## 2016-03-13 ENCOUNTER — Ambulatory Visit (HOSPITAL_COMMUNITY)
Admission: RE | Admit: 2016-03-13 | Discharge: 2016-03-13 | Disposition: A | Discharge status: Home / Self Care | Payer: Medicaid Other | Source: Ambulatory Visit | Attending: Obstetrics & Gynecology | Admitting: Obstetrics & Gynecology

## 2016-03-13 ENCOUNTER — Encounter (HOSPITAL_COMMUNITY): Payer: Self-pay

## 2016-03-13 DIAGNOSIS — O34219 Maternal care for unspecified type scar from previous cesarean delivery: Secondary | ICD-10-CM | POA: Diagnosis not present

## 2016-03-13 DIAGNOSIS — O24312 Unspecified pre-existing diabetes mellitus in pregnancy, second trimester: Secondary | ICD-10-CM | POA: Insufficient documentation

## 2016-03-13 DIAGNOSIS — O10911 Unspecified pre-existing hypertension complicating pregnancy, first trimester: Secondary | ICD-10-CM

## 2016-03-13 DIAGNOSIS — Z3689 Encounter for other specified antenatal screening: Secondary | ICD-10-CM

## 2016-03-13 DIAGNOSIS — O24311 Unspecified pre-existing diabetes mellitus in pregnancy, first trimester: Secondary | ICD-10-CM

## 2016-03-13 DIAGNOSIS — O10012 Pre-existing essential hypertension complicating pregnancy, second trimester: Secondary | ICD-10-CM | POA: Insufficient documentation

## 2016-03-13 DIAGNOSIS — Z3A19 19 weeks gestation of pregnancy: Secondary | ICD-10-CM | POA: Diagnosis not present

## 2016-03-13 DIAGNOSIS — Z36 Encounter for antenatal screening for chromosomal anomalies: Secondary | ICD-10-CM | POA: Insufficient documentation

## 2016-03-14 ENCOUNTER — Other Ambulatory Visit (HOSPITAL_COMMUNITY): Payer: Self-pay | Admitting: *Deleted

## 2016-03-14 DIAGNOSIS — O10919 Unspecified pre-existing hypertension complicating pregnancy, unspecified trimester: Secondary | ICD-10-CM

## 2016-03-20 ENCOUNTER — Encounter: Payer: Self-pay | Admitting: *Deleted

## 2016-03-21 ENCOUNTER — Encounter: Payer: Medicaid Other | Admitting: Family Medicine

## 2016-03-26 ENCOUNTER — Encounter: Payer: Self-pay | Admitting: *Deleted

## 2016-04-06 ENCOUNTER — Ambulatory Visit (INDEPENDENT_AMBULATORY_CARE_PROVIDER_SITE_OTHER): Payer: Medicaid Other | Admitting: Obstetrics and Gynecology

## 2016-04-06 VITALS — BP 117/82 | HR 105 | Wt 244.0 lb

## 2016-04-06 DIAGNOSIS — E669 Obesity, unspecified: Secondary | ICD-10-CM

## 2016-04-06 DIAGNOSIS — O99210 Obesity complicating pregnancy, unspecified trimester: Secondary | ICD-10-CM

## 2016-04-06 DIAGNOSIS — O0992 Supervision of high risk pregnancy, unspecified, second trimester: Secondary | ICD-10-CM

## 2016-04-06 DIAGNOSIS — O99212 Obesity complicating pregnancy, second trimester: Secondary | ICD-10-CM

## 2016-04-06 DIAGNOSIS — O34219 Maternal care for unspecified type scar from previous cesarean delivery: Secondary | ICD-10-CM

## 2016-04-06 DIAGNOSIS — O10912 Unspecified pre-existing hypertension complicating pregnancy, second trimester: Secondary | ICD-10-CM

## 2016-04-06 DIAGNOSIS — O24312 Unspecified pre-existing diabetes mellitus in pregnancy, second trimester: Secondary | ICD-10-CM

## 2016-04-06 DIAGNOSIS — O24319 Unspecified pre-existing diabetes mellitus in pregnancy, unspecified trimester: Secondary | ICD-10-CM

## 2016-04-06 DIAGNOSIS — O10919 Unspecified pre-existing hypertension complicating pregnancy, unspecified trimester: Secondary | ICD-10-CM

## 2016-04-06 NOTE — Progress Notes (Deleted)
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 G2P1001 at 6547w6d being seen today for ongoing prenatal care.  She is currently monitored for the following issues for this high-risk pregnancy and has Preexisting diabetes complicating pregnancy, antepartum; Obesity (BMI 30-39.9); Supervision of high-risk pregnancy; Chronic hypertension complicating pregnancy, antepartum; Previous cesarean delivery for fetal intolerance of labor, antepartum; and Obesity in pregnancy, antepartum on her problem list.  Patient reports no complaints.   Contractions: Not present. Vag. Bleeding: None.  Movement: Absent. Denies leaking of fluid.   The following portions of the patient's history were reviewed and updated as appropriate: allergies, current medications, past family history, past medical history, past social history, past surgical history and problem list. Problem list updated.  Objective:   Vitals:   04/06/16 1057  BP: 117/82  Pulse: (!) 105  Weight: 244 lb (110.7 kg)    Fetal Status: Fetal Heart Rate (bpm): 145   Movement: Absent     General:  Alert, oriented and cooperative. Patient is in no acute distress.  Skin: Skin is warm and dry. No rash noted.   Cardiovascular: Normal heart rate noted  Respiratory: Normal respiratory effort, no problems with respiration noted  Abdomen: Soft, gravid, appropriate for gestational age. Pain/Pressure: Present     Pelvic:  Cervical exam deferred        Extremities: Normal range of motion.  Edema: None  Mental Status: Normal mood and affect. Normal behavior. Normal judgment and thought content.   Urinalysis:      Assessment and Plan:  Pregnancy: G2P1001 at 1547w6d  1. Supervision of high risk pregnancy in second trimester Routine care  2. Previous cesarean delivery for fetal intolerance of labor, antepartum D/w pt later in pregnancy re: delivery mode  3. Obesity in  pregnancy, antepartum See below.   4. Preexisting diabetes complicating pregnancy, antepartum ***  5. Chronic hypertension complicating pregnancy, antepartum ***  {Blank single:19197::"Term","Preterm"} labor symptoms and general obstetric precautions including but not limited to vaginal bleeding, contractions, leaking of fluid and fetal movement were reviewed in detail with the patient. Please refer to After Visit Summary for other counseling recommendations.  Return in about 2 weeks (around 04/20/2016).   [place] Bingharlie Chinwe Lope, MD

## 2016-04-06 NOTE — Progress Notes (Signed)
Prenatal Visit Note Date: 04/06/2016 [facility]  Subjective:  Joyce Green is a 30 y.o. G2P1001 at 875w6d being seen today for ongoing prenatal care.  She is currently monitored for the following issues for this high-risk pregnancy and has Preexisting diabetes complicating pregnancy, antepartum; Obesity (BMI 30-39.9); Supervision of high-risk pregnancy; Chronic hypertension complicating pregnancy, antepartum; Previous cesarean delivery for fetal intolerance of labor, antepartum; and Obesity in pregnancy, antepartum on her problem list.  Patient reports no complaints.   Contractions: Not present. Vag. Bleeding: None.  Movement: Absent. Denies leaking of fluid.   The following portions of the patient's history were reviewed and updated as appropriate: allergies, current medications, past family history, past medical history, past social history, past surgical history and problem list. Problem list updated.  Objective:   Vitals:   04/06/16 1057  BP: 117/82  Pulse: (!) 105  Weight: 244 lb (110.7 kg)    Fetal Status: Fetal Heart Rate (bpm): 145   Movement: Absent     General:  Alert, oriented and cooperative. Patient is in no acute distress.  Skin: Skin is warm and dry. No rash noted.   Cardiovascular: Normal heart rate noted  Respiratory: Normal respiratory effort, no problems with respiration noted  Abdomen: Soft, gravid, appropriate for gestational age. Pain/Pressure: Present     Pelvic:  Cervical exam deferred        Extremities: Normal range of motion.  Edema: None  Mental Status: Normal mood and affect. Normal behavior. Normal judgment and thought content.   Urinalysis:      Assessment and Plan:  Pregnancy: G2P1001 at 975w6d  1. Supervision of high risk pregnancy in second trimester Routine care. S/p normal anatomy scan  2. Previous cesarean delivery for fetal intolerance of labor, antepartum D/w pt re: delivery mode later in  pregnancy  3. Obesity in pregnancy, antepartum See below  4. Preexisting diabetes complicating pregnancy, antepartum On lantus 18 units qhs. Pt has sporadic BS meter readings. Pt told of importance of good BS control and hard to see if needs to adjust regimen but will leave on current regimen. Pt told to please try best to do am fasting and 2hr PPs.  10/20 2317: 103 1021 1015: 91 10/24 10am: 122 10/25 1050: 111 10/26 0930: 116 2010: 138 -consider a1c next visit if BS log still sporadic -fetal echo scheduled for November (pt had to r/s so husband could be there) -f/u mfm growth scan (already scheduled)  5. Chronic hypertension complicating pregnancy, antepartum Pt hasn't been taking procardia for 4wks and wonders if she has to be on it. She states she was told once in the ER that she need to be on meds but never took any. I told her that BPs can be lower due to pregnancy until about 28-32wks but for now it's fine to hold any meds and keep a close watch. Continue baby ASA.   Start AP testing at 32wks and PRN prior to that.   Preterm labor symptoms and general obstetric precautions including but not limited to vaginal bleeding, contractions, leaking of fluid and fetal movement were reviewed in detail with the patient. Please refer to After Visit Summary for other counseling recommendations.  Return in about 2 weeks (around 04/20/2016).   [place] Bingharlie Neil Errickson, MD

## 2016-04-17 ENCOUNTER — Ambulatory Visit (INDEPENDENT_AMBULATORY_CARE_PROVIDER_SITE_OTHER): Payer: Medicaid Other | Admitting: Obstetrics & Gynecology

## 2016-04-17 VITALS — BP 125/86 | HR 105 | Wt 243.0 lb

## 2016-04-17 DIAGNOSIS — O10919 Unspecified pre-existing hypertension complicating pregnancy, unspecified trimester: Secondary | ICD-10-CM

## 2016-04-17 DIAGNOSIS — O34219 Maternal care for unspecified type scar from previous cesarean delivery: Secondary | ICD-10-CM

## 2016-04-17 DIAGNOSIS — O0992 Supervision of high risk pregnancy, unspecified, second trimester: Secondary | ICD-10-CM

## 2016-04-17 DIAGNOSIS — O24312 Unspecified pre-existing diabetes mellitus in pregnancy, second trimester: Secondary | ICD-10-CM

## 2016-04-17 DIAGNOSIS — O10912 Unspecified pre-existing hypertension complicating pregnancy, second trimester: Secondary | ICD-10-CM

## 2016-04-17 MED ORDER — INSULIN GLARGINE 100 UNITS/ML SOLOSTAR PEN: 18.0000 [IU] | PEN_INJECTOR | Freq: Every day | SUBCUTANEOUS | 5 refills | Status: DC

## 2016-04-17 NOTE — Patient Instructions (Addendum)
Return to clinic for any scheduled appointments or obstetric concerns, or go to [facility] for evaluation  Trial of Labor After Cesarean Delivery Information A trial of labor after cesarean delivery (TOLAC) is when a woman tries to give birth vaginally after a previous cesarean delivery. TOLAC may be a safe and appropriate option for you depending on your medical history and other risk factors. When TOLAC is successful and you are able to have a vaginal delivery, this is called a vaginal birth after cesarean delivery (VBAC).  CANDIDATES FOR TOLAC TOLAC is possible for some women who:  Have undergone one or two prior cesarean deliveries in which the incision of the uterus was horizontal (low transverse).  Are carrying twins and have had one prior low transverse incision during a cesarean delivery.  Do not have a vertical (classical) uterine scar.  Have not had a tear in the wall of their uterus (uterine rupture). TOLAC is also supported for women who meet appropriate criteria and:  Are under the age of 40 years.  Are tall and have a body mass index (BMI) of less than 30.  Have an unknown uterine scar.  Give birth in a facility equipped to handle an emergency cesarean delivery. This team should be able to handle possible complications such as a uterine rupture.  Have thorough counseling about the benefits and risks of TOLAC.  Have discussed future pregnancy plans with their health care provider.  Plan to have several more pregnancies. MOST SUCCESSFUL CANDIDATES FOR TOLAC:  Have had a successful vaginal delivery before or after their cesarean delivery.  Experience labor that begins naturally on or before the due date (40 weeks of gestation).  Do not have a very large (macrosomic) baby.   Had a prior cesarean delivery but are not currently experiencing factors that would prompt a cesarean delivery (such as a breech position).  Had only one prior cesarean delivery.  Had a prior  cesarean delivery that was performed early in labor and not after full cervical dilation. TOLAC may be most appropriate for women who meet the above guidelines and who plan to have more pregnancies. TOLAC is not recommended for home births. LEAST SUCCESSFUL CANDIDATES FOR TOLAC:  Have an induced labor with an unfavorable cervix. An unfavorable cervix is when the cervix is not dilating enough (among other factors).  Have never had a vaginal delivery.  Have had more than two cesarean deliveries.  Have a pregnancy at more than 40 weeks of gestation.  Are pregnant with a baby with a suspected weight greater than 4,000 grams (8 pounds) and who have no prior history of a vaginal delivery.  Have closely spaced pregnancies. SUGGESTED BENEFITS OF TOLAC  You may have a faster recovery time.  You may have a shorter stay in the hospital.  You may have less pain and fewer problems than with a cesarean delivery. Women who have a cesarean delivery have a higher chance of needing blood or getting a fever, an infection, or a blood clot in the legs. SUGGESTED RISKS OF TOLAC The highest risk of complications happens to women who attempt a TOLAC and fail. A failed TOLAC results in an unplanned cesarean delivery. Risks related to TOLAC or repeat cesarean deliveries include:   Blood loss.  Infection.  Blood clot.  Injury to surrounding tissues or organs.  Having to remove the uterus (hysterectomy).  Potential problems with the placenta (such as placenta previa or placenta accreta) in future pregnancies. Although very rare, the main concerns   with TOLAC are:  Rupture of the uterine scar from a past cesarean delivery.  Needing an emergency cesarean delivery.  Having a bad outcome for the baby (perinatal morbidity). FOR MORE INFORMATION American Congress of Obstetricians and Gynecologists: www.acog.org American College of Nurse-Midwives: www.midwife.org   This information is not intended to  replace advice given to you by your health care provider. Make sure you discuss any questions you have with your health care provider.   Document Released: 02/13/2011 Document Revised: 03/18/2013 Document Reviewed: 11/17/2012 Elsevier Interactive Patient Education 2016 [employer] Inc[employer]  

## 2016-04-18 NOTE — Progress Notes (Signed)
   PRENATAL VISIT NOTE  Subjective:  Joyce Green is a 30 y.o. G2P1001 at 4559w4d being seen today for ongoing prenatal care.  She is currently monitored for the following issues for this high-risk pregnancy and has Preexisting diabetes complicating pregnancy, antepartum; Obesity (BMI 30-39.9); Supervision of high-risk pregnancy; Chronic hypertension complicating pregnancy, antepartum; Previous cesarean delivery for fetal intolerance of labor, antepartum; and Obesity in pregnancy, antepartum on her problem list.  Patient reports no complaints.  Contractions: Not present. Vag. Bleeding: None.  Movement: Present. Denies leaking of fluid.   The following portions of the patient's history were reviewed and updated as appropriate: allergies, current medications, past family history, past medical history, past social history, past surgical history and problem list. Problem list updated.  Objective:   Vitals:   04/17/16 1105  BP: 125/86  Pulse: (!) 105  Weight: 243 lb (110.2 kg)    Fetal Status: Fetal Heart Rate (bpm): 153 Fundal Height: 24 cm Movement: Present     General:  Alert, oriented and cooperative. Patient is in no acute distress.  Skin: Skin is warm and dry. No rash noted.   Cardiovascular: Normal heart rate noted  Respiratory: Normal respiratory effort, no problems with respiration noted  Abdomen: Soft, gravid, appropriate for gestational age. Pain/Pressure: Absent     Pelvic:  Cervical exam deferred        Extremities: Normal range of motion.  Edema: None  Mental Status: Normal mood and affect. Normal behavior. Normal judgment and thought content.   Stable CBGs, had two abnormal postprandials. Normal fastings.  Assessment and Plan:  Pregnancy: G2P1001 at 6659w4d  1. Preexisting diabetes complicating pregnancy in second trimester, antepartum Continue Lantus and Metformin 1000 mg po bid.  - insulin glargine (LANTUS) 100 unit/mL SOPN; Inject 0.18 mLs (18 Units total) into the  skin at bedtime.  Dispense: 15 mL; Refill: 5  2. Chronic hypertension complicating pregnancy, antepartum Stable BP, no meds. Continue ASA.  3. Previous cesarean delivery for fetal intolerance of labor, antepartum Discussed TOLAC vs RCS in detail, consent given to her to review at home. Will sign at subsequent visit.  4. Supervision of high risk pregnancy in second trimester Preterm labor symptoms and general obstetric precautions including but not limited to vaginal bleeding, contractions, leaking of fluid and fetal movement were reviewed in detail with the patient. Please refer to After Visit Summary for other counseling recommendations.  Return in about 3 weeks (around 05/08/2016) for OB Visit, 3rd trimester labs, TDap.   Tereso NewcomerUgonna A Anyanwu, MD

## 2016-04-25 ENCOUNTER — Other Ambulatory Visit (HOSPITAL_COMMUNITY): Payer: Self-pay | Admitting: *Deleted

## 2016-04-25 ENCOUNTER — Ambulatory Visit (HOSPITAL_COMMUNITY)
Admission: RE | Admit: 2016-04-25 | Discharge: 2016-04-25 | Disposition: A | Discharge status: Home / Self Care | Payer: Medicaid Other | Source: Ambulatory Visit | Attending: Obstetrics & Gynecology | Admitting: Obstetrics & Gynecology

## 2016-04-25 ENCOUNTER — Encounter (HOSPITAL_COMMUNITY): Payer: Self-pay

## 2016-04-25 DIAGNOSIS — Z3A25 25 weeks gestation of pregnancy: Secondary | ICD-10-CM | POA: Diagnosis not present

## 2016-04-25 DIAGNOSIS — O10919 Unspecified pre-existing hypertension complicating pregnancy, unspecified trimester: Secondary | ICD-10-CM

## 2016-04-25 DIAGNOSIS — O34219 Maternal care for unspecified type scar from previous cesarean delivery: Secondary | ICD-10-CM | POA: Insufficient documentation

## 2016-04-25 DIAGNOSIS — O24312 Unspecified pre-existing diabetes mellitus in pregnancy, second trimester: Secondary | ICD-10-CM | POA: Insufficient documentation

## 2016-04-25 DIAGNOSIS — O10012 Pre-existing essential hypertension complicating pregnancy, second trimester: Secondary | ICD-10-CM | POA: Insufficient documentation

## 2016-05-09 ENCOUNTER — Ambulatory Visit (INDEPENDENT_AMBULATORY_CARE_PROVIDER_SITE_OTHER): Payer: Medicaid Other | Admitting: Obstetrics and Gynecology

## 2016-05-09 VITALS — BP 121/79 | HR 109 | Wt 242.0 lb

## 2016-05-09 DIAGNOSIS — E669 Obesity, unspecified: Secondary | ICD-10-CM

## 2016-05-09 DIAGNOSIS — O24312 Unspecified pre-existing diabetes mellitus in pregnancy, second trimester: Secondary | ICD-10-CM | POA: Diagnosis not present

## 2016-05-09 DIAGNOSIS — O10912 Unspecified pre-existing hypertension complicating pregnancy, second trimester: Secondary | ICD-10-CM | POA: Diagnosis not present

## 2016-05-09 DIAGNOSIS — O99210 Obesity complicating pregnancy, unspecified trimester: Secondary | ICD-10-CM

## 2016-05-09 DIAGNOSIS — O10919 Unspecified pre-existing hypertension complicating pregnancy, unspecified trimester: Secondary | ICD-10-CM

## 2016-05-09 DIAGNOSIS — O99212 Obesity complicating pregnancy, second trimester: Secondary | ICD-10-CM

## 2016-05-09 DIAGNOSIS — O24319 Unspecified pre-existing diabetes mellitus in pregnancy, unspecified trimester: Secondary | ICD-10-CM

## 2016-05-09 DIAGNOSIS — O34219 Maternal care for unspecified type scar from previous cesarean delivery: Secondary | ICD-10-CM

## 2016-05-09 DIAGNOSIS — O0992 Supervision of high risk pregnancy, unspecified, second trimester: Secondary | ICD-10-CM

## 2016-05-09 LAB — CBC
HEMATOCRIT: 32.8 % — AB (ref 35.0–45.0)
Hemoglobin: 10.9 g/dL — ABNORMAL LOW (ref 11.7–15.5)
MCH: 26.5 pg — AB (ref 27.0–33.0)
MCHC: 33.2 g/dL (ref 32.0–36.0)
MCV: 79.8 fL — AB (ref 80.0–100.0)
MPV: 10.6 fL (ref 7.5–12.5)
PLATELETS: 204 10*3/uL (ref 140–400)
RBC: 4.11 MIL/uL (ref 3.80–5.10)
RDW: 14.5 % (ref 11.0–15.0)
WBC: 7.5 10*3/uL (ref 3.8–10.8)

## 2016-05-09 LAB — HEMOGLOBIN A1C
Hgb A1c MFr Bld: 5.9 % — ABNORMAL HIGH (ref <5.7)
Mean Plasma Glucose: 123 mg/dL

## 2016-05-09 MED ORDER — INSULIN GLARGINE 100 UNITS/ML SOLOSTAR PEN: 20.0000 [IU] | PEN_INJECTOR | Freq: Every day | SUBCUTANEOUS | 5 refills | Status: DC

## 2016-05-09 MED ORDER — METFORMIN HCL 1000 MG PO TABS: 1500.0000 mg | ORAL_TABLET | Freq: Two times a day (BID) | ORAL | 0 refills | Status: DC

## 2016-05-09 NOTE — Progress Notes (Signed)
Prenatal Visit Note Date: 05/09/2016 [facility]: [facility]  Subjective:  Joyce Green is a 30 y.o. G2P1001 at 4945w4d being seen today for ongoing prenatal care.  She is currently monitored for the following issues for this high-risk pregnancy and has Preexisting diabetes complicating pregnancy, antepartum; Obesity (BMI 30-39.9); Supervision of high-risk pregnancy; Chronic hypertension complicating pregnancy, antepartum; Previous cesarean delivery for fetal intolerance of labor, antepartum; and Obesity in pregnancy, antepartum on her problem list.  Patient reports no complaints.   Contractions: Not present. Vag. Bleeding: None.  Movement: Present. Denies leaking of fluid.   The following portions of the patient's history were reviewed and updated as appropriate: allergies, current medications, past family history, past medical history, past social history, past surgical history and problem list. Problem list updated.  Objective:   Vitals:   05/09/16 1042  BP: 121/79  Pulse: (!) 109  Weight: 242 lb (109.8 kg)    Fetal Status: Fetal Heart Rate (bpm): 149   Movement: Present     General:  Alert, oriented and cooperative. Patient is in no acute distress.  Skin: Skin is warm and dry. No rash noted.   Cardiovascular: Normal heart rate noted  Respiratory: Normal respiratory effort, no problems with respiration noted  Abdomen: Soft, gravid, appropriate for gestational age. Pain/Pressure: Absent     Pelvic:  Cervical exam deferred        Extremities: Normal range of motion.  Edema: None  Mental Status: Normal mood and affect. Normal behavior. Normal judgment and thought content.   Urinalysis: Urine Protein: Trace Urine Glucose: Negative  Assessment and Plan:  Pregnancy: G2P1001 at 8545w4d  1. Pregnancy, supervision, high-risk, second trimester Routine care - CBC - HIV antibody - RPR - US MFM FETAL BPP W/NONSTRESS; Future   2. Preexisting diabetes complicating  pregnancy in second trimester, antepartum Increased lantus from 18 to 20 units qhs and increased metformin from 1000 bid to 1500 bid for 2hr PP in the 130s-170s (sporadic but >25% of values). Normal growth scan this month. mfm recs ap testing at 30wks so 30-32wk bpps ordered and then can do 2x/week testing here. - insulin glargine (LANTUS) 100 unit/mL SOPN; Inject 0.2 mLs (20 Units total) into the skin at bedtime.  Dispense: 15 mL; Refill: 5 - HgB A1c  3. Chronic hypertension complicating pregnancy, antepartum No meds. Well controlled  6. Previous cesarean delivery for fetal intolerance of labor, antepartum Can d/w pt later in pregnancy re: delivery mode  Preterm labor symptoms and general obstetric precautions including but not limited to vaginal bleeding, contractions, leaking of fluid and fetal movement were reviewed in detail with the patient. Please refer to After Visit Summary for other counseling recommendations.  Return in about 10 days (around 05/19/2016) for 10-14d rob.   [place] Bingharlie Zamier Eggebrecht, MD

## 2016-05-10 LAB — RPR

## 2016-05-10 LAB — HIV ANTIBODY (ROUTINE TESTING W REFLEX): HIV 1&2 Ab, 4th Generation: NONREACTIVE

## 2016-05-21 ENCOUNTER — Ambulatory Visit (INDEPENDENT_AMBULATORY_CARE_PROVIDER_SITE_OTHER): Payer: Medicaid Other | Admitting: Obstetrics and Gynecology

## 2016-05-21 VITALS — BP 139/93 | HR 110 | Wt 247.2 lb

## 2016-05-21 DIAGNOSIS — O34219 Maternal care for unspecified type scar from previous cesarean delivery: Secondary | ICD-10-CM

## 2016-05-21 DIAGNOSIS — O99210 Obesity complicating pregnancy, unspecified trimester: Secondary | ICD-10-CM

## 2016-05-21 DIAGNOSIS — O10913 Unspecified pre-existing hypertension complicating pregnancy, third trimester: Secondary | ICD-10-CM

## 2016-05-21 DIAGNOSIS — O10919 Unspecified pre-existing hypertension complicating pregnancy, unspecified trimester: Secondary | ICD-10-CM

## 2016-05-21 DIAGNOSIS — E669 Obesity, unspecified: Secondary | ICD-10-CM

## 2016-05-21 DIAGNOSIS — Z23 Encounter for immunization: Secondary | ICD-10-CM | POA: Diagnosis not present

## 2016-05-21 DIAGNOSIS — O24313 Unspecified pre-existing diabetes mellitus in pregnancy, third trimester: Secondary | ICD-10-CM

## 2016-05-21 DIAGNOSIS — O0993 Supervision of high risk pregnancy, unspecified, third trimester: Secondary | ICD-10-CM

## 2016-05-21 DIAGNOSIS — O24319 Unspecified pre-existing diabetes mellitus in pregnancy, unspecified trimester: Secondary | ICD-10-CM

## 2016-05-21 DIAGNOSIS — O99213 Obesity complicating pregnancy, third trimester: Secondary | ICD-10-CM

## 2016-05-21 NOTE — Progress Notes (Signed)
Prenatal Visit Note Date: 05/21/2016 Clinic: [facility]  Subjective:  Joyce Green is a 30 y.o. G2P1001 at 7023w2d being seen today for ongoing prenatal care.  She is currently monitored for the following issues for this high-risk pregnancy and has Preexisting diabetes complicating pregnancy, antepartum; Obesity (BMI 30-39.9); Supervision of high-risk pregnancy; Chronic hypertension complicating pregnancy, antepartum; Previous cesarean delivery for fetal intolerance of labor, antepartum; and Obesity in pregnancy, antepartum on her problem list.  Patient reports no complaints.   Contractions: Not present. Vag. Bleeding: None.  Movement: Present. Denies leaking of fluid.   The following portions of the patient's history were reviewed and updated as appropriate: allergies, current medications, past family history, past medical history, past social history, past surgical history and problem list. Problem list updated.  Objective:   Vitals:   05/21/16 1623  BP: 122/75  Pulse: (!) 106  Weight: 243 lb (110.2 kg)    Fetal Status: Fetal Heart Rate (bpm): 141   Movement: Present     General:  Alert, oriented and cooperative. Patient is in no acute distress.  Skin: Skin is warm and dry. No rash noted.   Cardiovascular: Normal heart rate noted  Respiratory: Normal respiratory effort, no problems with respiration noted  Abdomen: Soft, gravid, appropriate for gestational age. Pain/Pressure: Present     Pelvic:  Cervical exam deferred        Extremities: Normal range of motion.  Edema: None  Mental Status: Normal mood and affect. Normal behavior. Normal judgment and thought content.   Urinalysis: Urine Protein: Trace Urine Glucose: Negative  Assessment and Plan:  Pregnancy: G2P1001 at 1523w2d  1. Supervision of high risk pregnancy in third trimester Routine care. D/w pt nv re: birth control.   2. Previous cesarean delivery for fetal intolerance of labor, antepartum D/w  pt re: prior c-section and r/b of tolac vs rpt c-section. Has tolac consent already and will d/w pt more nv.  3. Chronic hypertension complicating pregnancy, antepartum Doing well on no meds  4. Preexisting diabetes complicating pregnancy, antepartum Normal BS log except during thanksgiving on NPH 18 and metformin 1000/1000 (pt didn't increase insulin and PO meds) but since normal values, can keep on current regimen. Follow up repeat growth scan later this week. Will set up qwk BPPs starting next week through 34wks.   5. Obesity in pregnancy, antepartum See above  Preterm labor symptoms and general obstetric precautions including but not limited to vaginal bleeding, contractions, leaking of fluid and fetal movement were reviewed in detail with the patient. Please refer to After Visit Summary for other counseling recommendations.  Return in about 2 weeks (around 06/04/2016).   [place] Bingharlie Javia Dillow, MD

## 2016-05-25 ENCOUNTER — Other Ambulatory Visit: Payer: Self-pay | Admitting: Obstetrics and Gynecology

## 2016-05-25 ENCOUNTER — Ambulatory Visit (HOSPITAL_COMMUNITY)
Admission: RE | Admit: 2016-05-25 | Discharge: 2016-05-25 | Disposition: A | Discharge status: Home / Self Care | Payer: Medicaid Other | Source: Ambulatory Visit | Attending: Obstetrics & Gynecology | Admitting: Obstetrics & Gynecology

## 2016-05-25 ENCOUNTER — Other Ambulatory Visit (HOSPITAL_COMMUNITY): Payer: Self-pay | Admitting: *Deleted

## 2016-05-25 ENCOUNTER — Encounter (HOSPITAL_COMMUNITY): Payer: Self-pay

## 2016-05-25 DIAGNOSIS — O0993 Supervision of high risk pregnancy, unspecified, third trimester: Secondary | ICD-10-CM | POA: Diagnosis present

## 2016-05-25 DIAGNOSIS — O24313 Unspecified pre-existing diabetes mellitus in pregnancy, third trimester: Secondary | ICD-10-CM

## 2016-05-25 DIAGNOSIS — O10919 Unspecified pre-existing hypertension complicating pregnancy, unspecified trimester: Secondary | ICD-10-CM

## 2016-05-25 DIAGNOSIS — Z3A29 29 weeks gestation of pregnancy: Secondary | ICD-10-CM | POA: Diagnosis not present

## 2016-05-25 DIAGNOSIS — O10019 Pre-existing essential hypertension complicating pregnancy, unspecified trimester: Secondary | ICD-10-CM | POA: Diagnosis not present

## 2016-05-25 DIAGNOSIS — Z794 Long term (current) use of insulin: Principal | ICD-10-CM

## 2016-05-25 DIAGNOSIS — IMO0001 Reserved for inherently not codable concepts without codable children: Secondary | ICD-10-CM

## 2016-06-01 ENCOUNTER — Ambulatory Visit (HOSPITAL_COMMUNITY): Admission: RE | Admit: 2016-06-01 | Payer: Medicaid Other | Source: Ambulatory Visit

## 2016-06-06 ENCOUNTER — Ambulatory Visit (INDEPENDENT_AMBULATORY_CARE_PROVIDER_SITE_OTHER): Payer: Medicaid Other | Admitting: Obstetrics and Gynecology

## 2016-06-06 VITALS — BP 138/84 | HR 102 | Wt 245.0 lb

## 2016-06-06 DIAGNOSIS — O34219 Maternal care for unspecified type scar from previous cesarean delivery: Secondary | ICD-10-CM

## 2016-06-06 DIAGNOSIS — O99212 Obesity complicating pregnancy, second trimester: Secondary | ICD-10-CM

## 2016-06-06 DIAGNOSIS — E669 Obesity, unspecified: Secondary | ICD-10-CM

## 2016-06-06 DIAGNOSIS — O10912 Unspecified pre-existing hypertension complicating pregnancy, second trimester: Secondary | ICD-10-CM

## 2016-06-06 DIAGNOSIS — O10919 Unspecified pre-existing hypertension complicating pregnancy, unspecified trimester: Secondary | ICD-10-CM

## 2016-06-06 DIAGNOSIS — O0992 Supervision of high risk pregnancy, unspecified, second trimester: Secondary | ICD-10-CM

## 2016-06-06 DIAGNOSIS — O24319 Unspecified pre-existing diabetes mellitus in pregnancy, unspecified trimester: Secondary | ICD-10-CM

## 2016-06-06 DIAGNOSIS — O24312 Unspecified pre-existing diabetes mellitus in pregnancy, second trimester: Secondary | ICD-10-CM

## 2016-06-06 NOTE — Progress Notes (Signed)
Prenatal Visit Note Date: 06/06/2016 Clinic: [facility]  Subjective:  Joyce Green is a 30 y.o. G2P1001 at 6239w4d being seen today for ongoing prenatal care.  She is currently monitored for the following issues for this high-risk pregnancy and has Preexisting diabetes complicating pregnancy, antepartum; Obesity (BMI 30-39.9); Supervision of high-risk pregnancy; Chronic hypertension complicating pregnancy, antepartum; Previous cesarean delivery for fetal intolerance of labor, antepartum; and Obesity in pregnancy, antepartum on her problem list.  Patient reports no complaints.   Contractions: Not present. Vag. Bleeding: None.  Movement: Present. Denies leaking of fluid.   The following portions of the patient's history were reviewed and updated as appropriate: allergies, current medications, past family history, past medical history, past social history, past surgical history and problem list. Problem list updated.  Objective:   Vitals:   06/06/16 1322  BP: 138/84  Pulse: (!) 102  Weight: 245 lb (111.1 kg)    Fetal Status: Fetal Heart Rate (bpm): 145   Movement: Present     General:  Alert, oriented and cooperative. Patient is in no acute distress.  Skin: Skin is warm and dry. No rash noted.   Cardiovascular: Normal heart rate noted  Respiratory: Normal respiratory effort, no problems with respiration noted  Abdomen: Soft, gravid, appropriate for gestational age. Pain/Pressure: Present     Pelvic:  Cervical exam deferred        Extremities: Normal range of motion.  Edema: None  Mental Status: Normal mood and affect. Normal behavior. Normal judgment and thought content.   Urinalysis:      Assessment and Plan:  Pregnancy: G2P1001 at 6439w4d  1. Supervision of high risk pregnancy in third trimester Routine care. D/w pt nv re: birth control.   2. Previous cesarean delivery for fetal intolerance of labor, antepartum D/w pt re: prior c-section and r/b of tolac  vs rpt c-section. Has tolac consent already and will d/w pt more nv.  3. Chronic hypertension complicating pregnancy, antepartum Doing well on no meds  4. Preexisting diabetes complicating pregnancy, antepartum Normal BS log. Continue with metformin 1000/1000 and NPH 18 units qhs. Has BPP later this week (missed last no one on 12/22). Can start 2x/week testing after that.   5. Obesity in pregnancy, antepartum See above  Preterm labor symptoms and general obstetric precautions including but not limited to vaginal bleeding, contractions, leaking of fluid and fetal movement were reviewed in detail with the patient. Please refer to After Visit Summary for other counseling recommendations.  Return in about 9 days (around 06/15/2016) for rob and start 2x/week testing then with NST and AFI.   [place] Bingharlie Alysen Smylie, MD

## 2016-06-08 ENCOUNTER — Other Ambulatory Visit: Payer: Self-pay | Admitting: Obstetrics and Gynecology

## 2016-06-08 ENCOUNTER — Ambulatory Visit (HOSPITAL_COMMUNITY)
Admission: RE | Admit: 2016-06-08 | Discharge: 2016-06-08 | Disposition: A | Discharge status: Home / Self Care | Payer: Medicaid Other | Source: Ambulatory Visit | Attending: Obstetrics and Gynecology | Admitting: Obstetrics and Gynecology

## 2016-06-08 DIAGNOSIS — O24313 Unspecified pre-existing diabetes mellitus in pregnancy, third trimester: Secondary | ICD-10-CM

## 2016-06-08 DIAGNOSIS — O10019 Pre-existing essential hypertension complicating pregnancy, unspecified trimester: Secondary | ICD-10-CM

## 2016-06-08 DIAGNOSIS — Z3A31 31 weeks gestation of pregnancy: Secondary | ICD-10-CM | POA: Diagnosis not present

## 2016-06-08 DIAGNOSIS — O10919 Unspecified pre-existing hypertension complicating pregnancy, unspecified trimester: Secondary | ICD-10-CM

## 2016-06-08 DIAGNOSIS — O169 Unspecified maternal hypertension, unspecified trimester: Secondary | ICD-10-CM

## 2016-06-08 DIAGNOSIS — O10013 Pre-existing essential hypertension complicating pregnancy, third trimester: Secondary | ICD-10-CM | POA: Insufficient documentation

## 2016-06-08 DIAGNOSIS — O0993 Supervision of high risk pregnancy, unspecified, third trimester: Secondary | ICD-10-CM

## 2016-06-14 ENCOUNTER — Ambulatory Visit (INDEPENDENT_AMBULATORY_CARE_PROVIDER_SITE_OTHER): Payer: Medicaid Other | Admitting: Student

## 2016-06-14 VITALS — BP 116/74 | HR 110 | Wt 246.0 lb

## 2016-06-14 DIAGNOSIS — O24313 Unspecified pre-existing diabetes mellitus in pregnancy, third trimester: Secondary | ICD-10-CM | POA: Diagnosis not present

## 2016-06-14 DIAGNOSIS — E669 Obesity, unspecified: Secondary | ICD-10-CM

## 2016-06-14 DIAGNOSIS — O10913 Unspecified pre-existing hypertension complicating pregnancy, third trimester: Secondary | ICD-10-CM | POA: Diagnosis not present

## 2016-06-14 DIAGNOSIS — O99213 Obesity complicating pregnancy, third trimester: Secondary | ICD-10-CM | POA: Diagnosis not present

## 2016-06-14 DIAGNOSIS — O24319 Unspecified pre-existing diabetes mellitus in pregnancy, unspecified trimester: Secondary | ICD-10-CM

## 2016-06-14 DIAGNOSIS — Z3689 Encounter for other specified antenatal screening: Secondary | ICD-10-CM

## 2016-06-14 DIAGNOSIS — O10919 Unspecified pre-existing hypertension complicating pregnancy, unspecified trimester: Secondary | ICD-10-CM

## 2016-06-14 DIAGNOSIS — O34219 Maternal care for unspecified type scar from previous cesarean delivery: Secondary | ICD-10-CM | POA: Diagnosis not present

## 2016-06-14 DIAGNOSIS — O99210 Obesity complicating pregnancy, unspecified trimester: Secondary | ICD-10-CM

## 2016-06-14 DIAGNOSIS — O0992 Supervision of high risk pregnancy, unspecified, second trimester: Secondary | ICD-10-CM

## 2016-06-14 NOTE — Progress Notes (Signed)
   PRENATAL VISIT NOTE  Subjective:  Joyce Green is a 31 y.o. G2P1001 at 7627w5d being seen today for ongoing prenatal care.  She is currently monitored for the following issues for this high-risk pregnancy and has Preexisting diabetes complicating pregnancy, antepartum; Obesity (BMI 30-39.9); Supervision of high-risk pregnancy; Chronic hypertension complicating pregnancy, antepartum; Previous cesarean delivery for fetal intolerance of labor, antepartum; and Obesity in pregnancy, antepartum on her problem list.  Patient reports no complaints.  Contractions: Not present. Vag. Bleeding: None.  Movement: Present. Denies leaking of fluid.   The following portions of the patient's history were reviewed and updated as appropriate: allergies, current medications, past family history, past medical history, past social history, past surgical history and problem list. Problem list updated.  Objective:   Vitals:   06/14/16 1556  BP: 116/74  Pulse: (!) 110  Weight: 111.6 kg (246 lb)    Fetal Status: Fetal Heart Rate (bpm): 138   Movement: Present  Presentation: Vertex  General:  Alert, oriented and cooperative. Patient is in no acute distress.  Skin: Skin is warm and dry. No rash noted.   Cardiovascular: Normal heart rate noted  Respiratory: Normal respiratory effort, no problems with respiration noted  Abdomen: Soft, gravid, appropriate for gestational age. Pain/Pressure: Present     Pelvic:  Cervical exam deferred        Extremities: Normal range of motion.  Edema: None  Mental Status: Normal mood and affect. Normal behavior. Normal judgment and thought content.   Assessment and Plan:  Pregnancy: G2P1001 at 6227w5d  1. Chronic hypertension in pregnancy  - Amniotic fluid index with NST. AFI was 19; NST reactive with [facility] of 135 with accelerations, moderate variability, no decelerations and no contractions.   2. Chronic hypertension complicating pregnancy, antepartum -Still on baby ASA; BP  is 116/74 today  3. Preexisting diabetes complicating pregnancy, antepartum -Did not have her log; reports that her fasting blood sugars are between 88 and high 90s, and post prandials around 130. Still taking her metformin. Advised to bring log NV.   4. Supervision of high risk pregnancy in second trimester -doing well with insulin and metformin, will begin more intensive monitoring for high risk pregnancy and has her visits scheduled through the end of January   5. Previous cesarean delivery for fetal intolerance of labor, antepartum -discussed VBAC vs. Repeat today, patient signed VBAC papers to attempt TOLAC but understands that a c-section may be necessary    Term labor symptoms and general obstetric precautions including but not limited to vaginal bleeding, contractions, leaking of fluid and fetal movement were reviewed in detail with the patient. Please refer to After Visit Summary for other counseling recommendations.  No Follow-up on file.   Marylene LandKathryn Lorraine Kooistra, CNM

## 2016-06-18 ENCOUNTER — Ambulatory Visit (HOSPITAL_COMMUNITY)
Admission: RE | Admit: 2016-06-18 | Discharge: 2016-06-18 | Disposition: A | Discharge status: Home / Self Care | Payer: Medicaid Other | Source: Ambulatory Visit | Attending: Obstetrics and Gynecology | Admitting: Obstetrics and Gynecology

## 2016-06-18 ENCOUNTER — Encounter: Payer: Self-pay | Admitting: *Deleted

## 2016-06-18 ENCOUNTER — Other Ambulatory Visit: Payer: Self-pay | Admitting: Obstetrics and Gynecology

## 2016-06-18 ENCOUNTER — Ambulatory Visit (INDEPENDENT_AMBULATORY_CARE_PROVIDER_SITE_OTHER): Payer: Medicaid Other | Admitting: *Deleted

## 2016-06-18 VITALS — BP 129/66 | HR 110 | Wt 247.0 lb

## 2016-06-18 DIAGNOSIS — O10913 Unspecified pre-existing hypertension complicating pregnancy, third trimester: Secondary | ICD-10-CM

## 2016-06-18 DIAGNOSIS — O0993 Supervision of high risk pregnancy, unspecified, third trimester: Secondary | ICD-10-CM

## 2016-06-18 DIAGNOSIS — Z3A33 33 weeks gestation of pregnancy: Secondary | ICD-10-CM | POA: Diagnosis not present

## 2016-06-18 DIAGNOSIS — O24019 Pre-existing diabetes mellitus, type 1, in pregnancy, unspecified trimester: Secondary | ICD-10-CM

## 2016-06-18 DIAGNOSIS — O10013 Pre-existing essential hypertension complicating pregnancy, third trimester: Secondary | ICD-10-CM | POA: Insufficient documentation

## 2016-06-18 DIAGNOSIS — O289 Unspecified abnormal findings on antenatal screening of mother: Secondary | ICD-10-CM | POA: Diagnosis not present

## 2016-06-18 DIAGNOSIS — O24313 Unspecified pre-existing diabetes mellitus in pregnancy, third trimester: Secondary | ICD-10-CM | POA: Insufficient documentation

## 2016-06-18 DIAGNOSIS — O10919 Unspecified pre-existing hypertension complicating pregnancy, unspecified trimester: Secondary | ICD-10-CM

## 2016-06-18 NOTE — Progress Notes (Signed)
NST nonreactive. Will get BPP.

## 2016-06-21 ENCOUNTER — Ambulatory Visit (INDEPENDENT_AMBULATORY_CARE_PROVIDER_SITE_OTHER): Payer: Medicaid Other | Admitting: Obstetrics & Gynecology

## 2016-06-21 VITALS — BP 121/77 | HR 115 | Wt 251.0 lb

## 2016-06-21 DIAGNOSIS — O0993 Supervision of high risk pregnancy, unspecified, third trimester: Secondary | ICD-10-CM

## 2016-06-21 DIAGNOSIS — O10913 Unspecified pre-existing hypertension complicating pregnancy, third trimester: Secondary | ICD-10-CM | POA: Diagnosis not present

## 2016-06-21 DIAGNOSIS — O34219 Maternal care for unspecified type scar from previous cesarean delivery: Secondary | ICD-10-CM

## 2016-06-21 DIAGNOSIS — O24311 Unspecified pre-existing diabetes mellitus in pregnancy, first trimester: Secondary | ICD-10-CM

## 2016-06-21 MED ORDER — GLUCOSE BLOOD VI STRP: ORAL_STRIP | 12 refills | Status: DC

## 2016-06-21 MED ORDER — METFORMIN HCL 1000 MG PO TABS: 1500.0000 mg | ORAL_TABLET | Freq: Two times a day (BID) | ORAL | 4 refills | Status: DC

## 2016-06-22 NOTE — Progress Notes (Signed)
   PRENATAL VISIT NOTE  Subjective:  Joyce Green is aVallery Green 31 y.o. G2P1001 at 7964w6d being seen today for ongoing prenatal care.  She is currently monitored for the following issues for this high-risk pregnancy and has Preexisting diabetes complicating pregnancy, antepartum; Obesity (BMI 30-39.9); Supervision of high-risk pregnancy; Chronic hypertension complicating pregnancy, antepartum; Previous cesarean delivery for fetal intolerance of labor, antepartum; and Obesity in pregnancy, antepartum on her problem list.  Patient reports no complaints.  Contractions: Not present. Vag. Bleeding: None.  Movement: Present. Denies leaking of fluid.   The following portions of the patient's history were reviewed and updated as appropriate: allergies, current medications, past family history, past medical history, past social history, past surgical history and problem list. Problem list updated.  Objective:   Vitals:   06/21/16 1057  BP: 121/77  Pulse: (!) 115  Weight: 251 lb (113.9 kg)    Fetal Status: Fetal Heart Rate (bpm): 139 Fundal Height: 35 cm Movement: Present     General:  Alert, oriented and cooperative. Patient is in no acute distress.  Skin: Skin is warm and dry. No rash noted.   Cardiovascular: Normal heart rate noted  Respiratory: Normal respiratory effort, no problems with respiration noted  Abdomen: Soft, gravid, appropriate for gestational age. Pain/Pressure: Absent     Pelvic:  Cervical exam deferred        Extremities: Normal range of motion.  Edema: None  Mental Status: Normal mood and affect. Normal behavior. Normal judgment and thought content.   NST performed today was reviewed and was found to be reactive.  Assessment and Plan:  Pregnancy: G2P1001 at 7264w6d  1. Chronic hypertension complicating or reason for care during pregnancy, third trimester Continue recommended antenatal testing and prenatal care. Already scheduled for growth scans.   2. Preexisting diabetes  complicating pregnancy, antepartum, first trimester Forgot blood sugar log. Emphasized importance of bringing log for evaluation to optimize glycemic control. Refilled Metformin as per request.  - metFORMIN (GLUCOPHAGE) 1000 MG tablet; Take 1.5 tablets (1,500 mg total) by mouth 2 (two) times daily with Joyce meal.  Dispense: 90 tablet; Refill: 4 - glucose blood test strip; Use as instructed  Dispense: 100 each; Refill: 12 Continue recommended antenatal testing and prenatal care. Already scheduled for growth scans.   3. Previous cesarean delivery for fetal intolerance of labor, antepartum Desires TOLAC, consent already signed.  4. Supervision of high risk pregnancy in third trimester Preterm labor symptoms and general obstetric precautions including but not limited to vaginal bleeding, contractions, leaking of fluid and fetal movement were reviewed in detail with the patient. Please refer to After Visit Summary for other counseling recommendations.  Return for NST 06-25-16 and OB fu/NST 06-28-16.   Joyce Green Joyce Mariafernanda Hendricksen, MD

## 2016-06-25 ENCOUNTER — Ambulatory Visit (INDEPENDENT_AMBULATORY_CARE_PROVIDER_SITE_OTHER): Payer: Medicaid Other | Admitting: *Deleted

## 2016-06-25 VITALS — BP 126/85 | HR 116 | Wt 249.0 lb

## 2016-06-25 DIAGNOSIS — O10913 Unspecified pre-existing hypertension complicating pregnancy, third trimester: Secondary | ICD-10-CM | POA: Diagnosis not present

## 2016-06-25 NOTE — Progress Notes (Signed)
Pt has growth US on Wednesday 06-27-16 and NST on Thursday 06-28-16, pt states she cannot make the appt on Thursday due to work conflict, will call MFM and add BPP with US on Wednesday.

## 2016-06-25 NOTE — Progress Notes (Signed)
NST Note Date: 06/25/2016 Gestational Age: 31/2 FHT: 140 baseline, positive accelerations, negative, deceleration, moderate variability Toco: quiet Time: 0 minutes  A/P: rNST. Continue with current plan of care.  Cornelia Copaharlie Mj Willis, Jr MD Attending [facility](Faculty Practice)

## 2016-06-26 ENCOUNTER — Telehealth: Payer: Self-pay | Admitting: *Deleted

## 2016-06-26 DIAGNOSIS — O10913 Unspecified pre-existing hypertension complicating pregnancy, third trimester: Secondary | ICD-10-CM

## 2016-06-26 NOTE — Telephone Encounter (Signed)
Order placed for BPP to add on to growth US on 06-27-16.

## 2016-06-27 ENCOUNTER — Ambulatory Visit (HOSPITAL_COMMUNITY)
Admission: RE | Admit: 2016-06-27 | Discharge: 2016-06-27 | Disposition: A | Discharge status: Home / Self Care | Payer: Medicaid Other | Source: Ambulatory Visit | Attending: Obstetrics & Gynecology | Admitting: Obstetrics & Gynecology

## 2016-06-28 ENCOUNTER — Encounter: Payer: Medicaid Other | Admitting: Student

## 2016-06-29 ENCOUNTER — Other Ambulatory Visit (HOSPITAL_COMMUNITY): Payer: Self-pay | Admitting: Maternal and Fetal Medicine

## 2016-06-29 ENCOUNTER — Ambulatory Visit (HOSPITAL_COMMUNITY)
Admission: RE | Admit: 2016-06-29 | Discharge: 2016-06-29 | Disposition: A | Discharge status: Home / Self Care | Payer: Medicaid Other | Source: Ambulatory Visit | Attending: Obstetrics & Gynecology | Admitting: Obstetrics & Gynecology

## 2016-06-29 ENCOUNTER — Encounter (HOSPITAL_COMMUNITY): Payer: Self-pay

## 2016-06-29 DIAGNOSIS — Z794 Long term (current) use of insulin: Secondary | ICD-10-CM | POA: Insufficient documentation

## 2016-06-29 DIAGNOSIS — Z3A34 34 weeks gestation of pregnancy: Secondary | ICD-10-CM

## 2016-06-29 DIAGNOSIS — O163 Unspecified maternal hypertension, third trimester: Secondary | ICD-10-CM

## 2016-06-29 DIAGNOSIS — O10913 Unspecified pre-existing hypertension complicating pregnancy, third trimester: Secondary | ICD-10-CM | POA: Diagnosis present

## 2016-06-29 DIAGNOSIS — O34219 Maternal care for unspecified type scar from previous cesarean delivery: Secondary | ICD-10-CM | POA: Diagnosis not present

## 2016-06-29 DIAGNOSIS — O24313 Unspecified pre-existing diabetes mellitus in pregnancy, third trimester: Principal | ICD-10-CM

## 2016-06-29 DIAGNOSIS — IMO0001 Reserved for inherently not codable concepts without codable children: Secondary | ICD-10-CM

## 2016-07-02 ENCOUNTER — Ambulatory Visit (INDEPENDENT_AMBULATORY_CARE_PROVIDER_SITE_OTHER): Payer: Medicaid Other | Admitting: *Deleted

## 2016-07-02 ENCOUNTER — Other Ambulatory Visit (HOSPITAL_COMMUNITY): Payer: Self-pay | Admitting: *Deleted

## 2016-07-02 VITALS — BP 136/85 | HR 105 | Wt 252.0 lb

## 2016-07-02 DIAGNOSIS — O24313 Unspecified pre-existing diabetes mellitus in pregnancy, third trimester: Secondary | ICD-10-CM | POA: Diagnosis not present

## 2016-07-02 DIAGNOSIS — O3663X0 Maternal care for excessive fetal growth, third trimester, not applicable or unspecified: Secondary | ICD-10-CM

## 2016-07-03 NOTE — Progress Notes (Signed)
NST reviewed and reactive.  

## 2016-07-05 ENCOUNTER — Ambulatory Visit (INDEPENDENT_AMBULATORY_CARE_PROVIDER_SITE_OTHER): Payer: Medicaid Other | Admitting: Obstetrics and Gynecology

## 2016-07-05 ENCOUNTER — Other Ambulatory Visit (HOSPITAL_COMMUNITY)
Admission: RE | Admit: 2016-07-05 | Discharge: 2016-07-05 | Disposition: A | Discharge status: Home / Self Care | Payer: Medicaid Other | Source: Ambulatory Visit | Attending: Obstetrics and Gynecology | Admitting: Obstetrics and Gynecology

## 2016-07-05 VITALS — BP 128/79 | HR 118 | Wt 253.0 lb

## 2016-07-05 DIAGNOSIS — Z3689 Encounter for other specified antenatal screening: Secondary | ICD-10-CM

## 2016-07-05 DIAGNOSIS — O0993 Supervision of high risk pregnancy, unspecified, third trimester: Secondary | ICD-10-CM

## 2016-07-05 DIAGNOSIS — O24313 Unspecified pre-existing diabetes mellitus in pregnancy, third trimester: Secondary | ICD-10-CM

## 2016-07-05 DIAGNOSIS — Z113 Encounter for screening for infections with a predominantly sexual mode of transmission: Secondary | ICD-10-CM | POA: Diagnosis present

## 2016-07-05 DIAGNOSIS — O10913 Unspecified pre-existing hypertension complicating pregnancy, third trimester: Secondary | ICD-10-CM | POA: Diagnosis not present

## 2016-07-05 DIAGNOSIS — O10919 Unspecified pre-existing hypertension complicating pregnancy, unspecified trimester: Secondary | ICD-10-CM

## 2016-07-05 DIAGNOSIS — O34219 Maternal care for unspecified type scar from previous cesarean delivery: Secondary | ICD-10-CM

## 2016-07-05 DIAGNOSIS — O99210 Obesity complicating pregnancy, unspecified trimester: Secondary | ICD-10-CM

## 2016-07-05 DIAGNOSIS — O99213 Obesity complicating pregnancy, third trimester: Secondary | ICD-10-CM

## 2016-07-05 DIAGNOSIS — O24319 Unspecified pre-existing diabetes mellitus in pregnancy, unspecified trimester: Secondary | ICD-10-CM

## 2016-07-05 NOTE — Progress Notes (Addendum)
Prenatal Visit Note Date: 07/05/2016 Clinic: [facility]  Subjective:  Joyce Green is a 31 y.o. G2P1001 at 5470w5d being seen today for ongoing prenatal care.  She is currently monitored for the following issues for this high-risk pregnancy and has Preexisting diabetes complicating pregnancy, antepartum; Obesity (BMI 30-39.9); Supervision of high-risk pregnancy; Chronic hypertension complicating pregnancy, antepartum; Previous cesarean delivery for fetal intolerance of labor, antepartum; and Obesity in pregnancy, antepartum on her problem list.  Patient reports no complaints.   Contractions: Not present. Vag. Bleeding: None.  Movement: Present. Denies leaking of fluid.   The following portions of the patient's history were reviewed and updated as appropriate: allergies, current medications, past family history, past medical history, past social history, past surgical history and problem list. Problem list updated.  Objective:   Vitals:   07/05/16 1055  BP: 128/79  Pulse: (!) 118  Weight: 253 lb (114.8 kg)    Fetal Status: Fetal Heart Rate (bpm): 150   Movement: Present  Presentation: Vertex  General:  Alert, oriented and cooperative. Patient is in no acute distress.  Skin: Skin is warm and dry. No rash noted.   Cardiovascular: Normal heart rate noted  Respiratory: Normal respiratory effort, no problems with respiration noted  Abdomen: Soft, gravid, appropriate for gestational age. Pain/Pressure: Present     Pelvic:  Cervical exam deferred        Extremities: Normal range of motion.  Edema: Trace  Mental Status: Normal mood and affect. Normal behavior. Normal judgment and thought content.   Urinalysis:      Assessment and Plan:  Pregnancy: G2P1001 at 8470w5d  1. Chronic hypertension complicating or reason for care during pregnancy, third trimester Doing well. On no meds. Continue with 2x/week testing. rNST (140 baseline, +accels, no decel, mod variability,  toco + irritability x 3864m). AFI 13 and cephalic today.  -1/19: cephalic, EFW >90% 3170g, AC>97%, AFI 22.5, 8/8 BPP  2. Supervision of high risk pregnancy in third trimester Routine care.  - Culture, beta strep (group b only) - Cervicovaginal ancillary only  3. Previous cesarean delivery for fetal intolerance of labor, antepartum Long d/w pt re: delivery planning. Patient had 02/2014 pLTCS with double layer closure. Labor notes are somewhat confusing but patient states she never got to pushing and talking to her and looking at the notes she got to about 7-8cm. Indication for c/s was FITL and variables were noted. I told her it looks like she had arrest of dilation and the variables; that infant was 2750gm. I d/w her r/b/a. I told her that I'd recommend scheduling a c/s at 39wks but if she goes into labor that it'd be reasonable to try for a TOLAC, pending her f/u growth u/s. Pt to consider options and can d/w her more at next rob.   4. Obesity in pregnancy, antepartum See above  5. Preexisting diabetes complicating pregnancy, antepartum Normal AM fastings and 2hr PP. I encouarged her to do better at keeping her 2hr PP values consistently.  Continue with metformin 1000/1000 and Lantus 18 qhs continue with 2x/week testing. Delivery at 39wks -s/p normal 05/2016 fetal echo.   Preterm labor symptoms and general obstetric precautions including but not limited to vaginal bleeding, contractions, leaking of fluid and fetal movement were reviewed in detail with the patient. Please refer to After Visit Summary for other counseling recommendations.  Return for continue with 2x/week testing. 1wk ROB.   [place] Bingharlie Cellie Dardis, MD

## 2016-07-06 LAB — CERVICOVAGINAL ANCILLARY ONLY
CHLAMYDIA, DNA PROBE: NEGATIVE
NEISSERIA GONORRHEA: NEGATIVE

## 2016-07-09 ENCOUNTER — Ambulatory Visit (INDEPENDENT_AMBULATORY_CARE_PROVIDER_SITE_OTHER): Payer: Medicaid Other | Admitting: *Deleted

## 2016-07-09 VITALS — BP 130/86 | HR 107 | Wt 251.0 lb

## 2016-07-09 DIAGNOSIS — O10919 Unspecified pre-existing hypertension complicating pregnancy, unspecified trimester: Secondary | ICD-10-CM

## 2016-07-09 DIAGNOSIS — O10913 Unspecified pre-existing hypertension complicating pregnancy, third trimester: Secondary | ICD-10-CM | POA: Diagnosis not present

## 2016-07-10 NOTE — Progress Notes (Signed)
NST Note Date: 07/09/2016 Gestational Age: 31/2 FHT: 140 baseline, positive accelerations, negative, deceleration, moderate variability Toco: +irritability Time: 25 minutes  A/P: rNST. Continue with current plan of care.   Cornelia Copaharlie Dajohn Ellender, Jr MD Attending [facility]ulty Practice)

## 2016-07-11 LAB — CULTURE, BETA STREP (GROUP B ONLY): STREP GP B CULTURE: NEGATIVE

## 2016-07-12 ENCOUNTER — Other Ambulatory Visit: Payer: Medicaid Other

## 2016-07-12 ENCOUNTER — Ambulatory Visit (INDEPENDENT_AMBULATORY_CARE_PROVIDER_SITE_OTHER): Payer: Medicaid Other | Admitting: Student

## 2016-07-12 DIAGNOSIS — O10913 Unspecified pre-existing hypertension complicating pregnancy, third trimester: Secondary | ICD-10-CM

## 2016-07-12 DIAGNOSIS — O34219 Maternal care for unspecified type scar from previous cesarean delivery: Secondary | ICD-10-CM

## 2016-07-12 DIAGNOSIS — O24313 Unspecified pre-existing diabetes mellitus in pregnancy, third trimester: Secondary | ICD-10-CM | POA: Diagnosis not present

## 2016-07-12 DIAGNOSIS — O10919 Unspecified pre-existing hypertension complicating pregnancy, unspecified trimester: Secondary | ICD-10-CM

## 2016-07-12 DIAGNOSIS — O24319 Unspecified pre-existing diabetes mellitus in pregnancy, unspecified trimester: Secondary | ICD-10-CM

## 2016-07-12 DIAGNOSIS — O0993 Supervision of high risk pregnancy, unspecified, third trimester: Secondary | ICD-10-CM

## 2016-07-13 NOTE — Progress Notes (Signed)
   PRENATAL VISIT NOTE  Subjective:  Joyce Green is a 31 y.o. G2P1001 at 1840w6d being seen today for ongoing prenatal care.  She is currently monitored for the following issues for this high-risk pregnancy and has Preexisting diabetes complicating pregnancy, antepartum; Obesity (BMI 30-39.9); Supervision of high-risk pregnancy; Chronic hypertension complicating pregnancy, antepartum; Previous cesarean delivery for fetal intolerance of labor, antepartum; and Obesity in pregnancy, antepartum on her problem list.  Patient reports no complaints.  Contractions: Not present. Vag. Bleeding: None.  Movement: Present. Denies leaking of fluid.   Patient reports that her fasting blood sugars are between 75 and high 80s, and that her 2 hour post prandials are consistently in the 130-140s. Patient reports that she is currently taking 1000mg  of Metformin in the morning and in the evening, and 18 units of Lantus at night. [name](Pat was originally told in early January to increased her metforming to 1500 BID but patient did not do that.)   The following portions of the patient's history were reviewed and updated as appropriate: allergies, current medications, past family history, past medical history, past social history, past surgical history and problem list. Problem list updated.  Objective:   Vitals:   07/12/16 1538  BP: 129/88  Pulse: (!) 112  Weight: 252 lb (114.3 kg)    Fetal Status: Fetal Heart Rate (bpm): 137 Fundal Height: 38 cm Movement: Present     General:  Alert, oriented and cooperative. Patient is in no acute distress.  Skin: Skin is warm and dry. No rash noted.   Cardiovascular: Normal heart rate noted  Respiratory: Normal respiratory effort, no problems with respiration noted  Abdomen: Soft, gravid, appropriate for gestational age. Pain/Pressure: Present     Pelvic:  Cervical exam deferred        Extremities: Normal range of motion.  Edema: Trace  Mental Status: Normal mood and affect.  Normal behavior. Normal judgment and thought content.   Assessment and Plan:  Pregnancy: G2P1001 at 7323w5d  1. Chronic hypertension affecting pregnancy  - Fetal nonstress test. Reactive today. Continue twice weekly testing.  -Blood pressure normal today -Patient is not on Procardia; was taken off of it earlier in pregnancy  -Patient informed to stop taking baby aspirin since she is 36 weeks   2. Preexisting diabetes complicating pregnancy, antepartum -Discussed patients blood sugar with Dr. Alysia PennaErvin, who recommends increasing metformin to 1500 BID. Patient verbalized understanding.   -Reviewed the importance of bringing her log to every visit, as we have no way of determining her glycemic control without a review of her blood sugars. Patient says that she will bring her log at the next visit.   3. Supervision of high risk pregnancy in third trimester  4. Previous cesarean delivery for fetal intolerance of labor, antepartum -[facility]; will discuss scheduling her induction at visit on 07/17/2016  5. Chronic hypertension complicating pregnancy, antepartum  Term labor symptoms and general obstetric precautions including but not limited to vaginal bleeding, contractions, leaking of fluid and fetal movement were reviewed in detail with the patient. Please refer to After Visit Summary for other counseling recommendations.  Return in about 1 week (around 07/19/2016) for ROB and NST.   Marylene LandKathryn Lorraine Devondre Guzzetta, CNM

## 2016-07-17 ENCOUNTER — Encounter (HOSPITAL_COMMUNITY): Payer: Self-pay | Admitting: *Deleted

## 2016-07-17 ENCOUNTER — Ambulatory Visit (INDEPENDENT_AMBULATORY_CARE_PROVIDER_SITE_OTHER): Payer: Medicaid Other | Admitting: Obstetrics & Gynecology

## 2016-07-17 ENCOUNTER — Telehealth (HOSPITAL_COMMUNITY): Payer: Self-pay | Admitting: *Deleted

## 2016-07-17 VITALS — BP 119/81 | HR 110

## 2016-07-17 DIAGNOSIS — Z3689 Encounter for other specified antenatal screening: Secondary | ICD-10-CM

## 2016-07-17 DIAGNOSIS — O24313 Unspecified pre-existing diabetes mellitus in pregnancy, third trimester: Secondary | ICD-10-CM

## 2016-07-17 DIAGNOSIS — O10919 Unspecified pre-existing hypertension complicating pregnancy, unspecified trimester: Secondary | ICD-10-CM

## 2016-07-17 DIAGNOSIS — O10913 Unspecified pre-existing hypertension complicating pregnancy, third trimester: Secondary | ICD-10-CM | POA: Diagnosis not present

## 2016-07-17 DIAGNOSIS — O34219 Maternal care for unspecified type scar from previous cesarean delivery: Secondary | ICD-10-CM

## 2016-07-17 DIAGNOSIS — O24319 Unspecified pre-existing diabetes mellitus in pregnancy, unspecified trimester: Secondary | ICD-10-CM

## 2016-07-17 DIAGNOSIS — O0993 Supervision of high risk pregnancy, unspecified, third trimester: Secondary | ICD-10-CM

## 2016-07-17 NOTE — Telephone Encounter (Signed)
Preadmission screen  

## 2016-07-17 NOTE — Patient Instructions (Signed)
Return to clinic for any scheduled appointments or obstetric concerns, or go to [facility] for evaluation  

## 2016-07-17 NOTE — Progress Notes (Signed)
   PRENATAL VISIT NOTE  Subjective:  Joyce Green is a 31 y.o. G2P1001 at 1868w3d being seen today for ongoing prenatal care.  She is currently monitored for the following issues for this high-risk pregnancy and has Preexisting diabetes complicating pregnancy, antepartum; Obesity (BMI 30-39.9); Supervision of high-risk pregnancy; Chronic hypertension complicating pregnancy, antepartum; Previous cesarean delivery for fetal intolerance of labor, antepartum; and Obesity in pregnancy, antepartum on her problem list.  Patient reports no complaints.  Contractions: Irregular. Vag. Bleeding: None.  Movement: Present. Denies leaking of fluid.   The following portions of the patient's history were reviewed and updated as appropriate: allergies, current medications, past family history, past medical history, past social history, past surgical history and problem list. Problem list updated.  Objective:   Vitals:   07/17/16 1536  BP: 119/81  Pulse: (!) 110    Fetal Status: Fetal Heart Rate (bpm): NST Fundal Height: 39 cm Movement: Present     General:  Alert, oriented and cooperative. Patient is in no acute distress.  Skin: Skin is warm and dry. No rash noted.   Cardiovascular: Normal heart rate noted  Respiratory: Normal respiratory effort, no problems with respiration noted  Abdomen: Soft, gravid, appropriate for gestational age. Pain/Pressure: Present     Pelvic:  Cervical exam deferred        Extremities: Normal range of motion.  Edema: Trace  Mental Status: Normal mood and affect. Normal behavior. Normal judgment and thought content.   NST performed today was reviewed and was found to be reactive.  AFI was also normal.  Continue recommended antenatal testing and prenatal care.  Assessment and Plan:  Pregnancy: G2P1001 at 5468w3d  1. Preexisting diabetes complicating pregnancy, antepartum Stable BS.  Continue Metformin and Lantus regimen. - Fetal nonstress test - Amniotic fluid  index  2. Chronic hypertension complicating pregnancy, antepartum Stable BP.  No meds currently.  3. Previous cesarean delivery for fetal intolerance of labor, antepartum Desires TOLAC, will follow up 38 week growth scan.   IOL scheduled at 39 weeks.   4. Supervision of high risk pregnancy in third trimester Term labor symptoms and general obstetric precautions including but not limited to vaginal bleeding, contractions, leaking of fluid and fetal movement were reviewed in detail with the patient. Please refer to After Visit Summary for other counseling recommendations.  Return in about 3 days (around 07/20/2016) for NST only.  07/24/16 ROB/NST/AFI .   Tereso NewcomerUgonna A Maryjo Ragon, MD

## 2016-07-20 ENCOUNTER — Ambulatory Visit (INDEPENDENT_AMBULATORY_CARE_PROVIDER_SITE_OTHER): Payer: Medicaid Other | Admitting: Obstetrics & Gynecology

## 2016-07-20 ENCOUNTER — Encounter: Payer: Self-pay | Admitting: Obstetrics & Gynecology

## 2016-07-20 ENCOUNTER — Encounter (HOSPITAL_COMMUNITY): Payer: Self-pay

## 2016-07-20 ENCOUNTER — Ambulatory Visit (HOSPITAL_COMMUNITY)
Admission: RE | Admit: 2016-07-20 | Discharge: 2016-07-20 | Disposition: A | Discharge status: Home / Self Care | Payer: Medicaid Other | Source: Ambulatory Visit | Attending: Obstetrics & Gynecology | Admitting: Obstetrics & Gynecology

## 2016-07-20 VITALS — BP 139/92 | HR 116 | Wt 252.0 lb

## 2016-07-20 DIAGNOSIS — O34219 Maternal care for unspecified type scar from previous cesarean delivery: Secondary | ICD-10-CM | POA: Insufficient documentation

## 2016-07-20 DIAGNOSIS — O10013 Pre-existing essential hypertension complicating pregnancy, third trimester: Secondary | ICD-10-CM | POA: Insufficient documentation

## 2016-07-20 DIAGNOSIS — O24313 Unspecified pre-existing diabetes mellitus in pregnancy, third trimester: Secondary | ICD-10-CM | POA: Insufficient documentation

## 2016-07-20 DIAGNOSIS — O10919 Unspecified pre-existing hypertension complicating pregnancy, unspecified trimester: Secondary | ICD-10-CM

## 2016-07-20 DIAGNOSIS — O10913 Unspecified pre-existing hypertension complicating pregnancy, third trimester: Secondary | ICD-10-CM

## 2016-07-20 DIAGNOSIS — O3663X0 Maternal care for excessive fetal growth, third trimester, not applicable or unspecified: Secondary | ICD-10-CM | POA: Insufficient documentation

## 2016-07-20 DIAGNOSIS — Z3A37 37 weeks gestation of pregnancy: Secondary | ICD-10-CM | POA: Diagnosis not present

## 2016-07-20 NOTE — Progress Notes (Signed)
   PRENATAL VISIT NOTE   Subjective:  Joyce Green is a 31 y.o. G2P1001 at 7569w6d being seen today for ongoing prenatal care.  She is currently monitored for the following issues for this high-risk pregnancy and has Preexisting diabetes complicating pregnancy, antepartum; Obesity (BMI 30-39.9); Supervision of high-risk pregnancy; Chronic hypertension complicating pregnancy, antepartum; Previous cesarean delivery for fetal intolerance of labor, antepartum; and Obesity in pregnancy, antepartum on her problem list.  Patient reports contractions since 0700.  Contractions: Irregular. Vag. Bleeding: None.  Movement: Present. Denies leaking of fluid.   The following portions of the patient's history were reviewed and updated as appropriate: allergies, current medications, past family history, past medical history, past social history, past surgical history and problem list. Problem list updated.  Objective:  There were no vitals filed for this visit.  Fetal Status: Fetal Heart Rate (bpm): NST   Movement: Present     General:  Alert, oriented and cooperative. Patient is in no acute distress.  Skin: Skin is warm and dry. No rash noted.   Cardiovascular: Normal heart rate noted  Respiratory: Normal respiratory effort, no problems with respiration noted  Abdomen: Soft, gravid, appropriate for gestational age. Pain/Pressure: Present     Pelvic:  Cervical exam performed        Extremities: Normal range of motion.  Edema: Trace  Mental Status: Normal mood and affect. Normal behavior. Normal judgment and thought content.   Assessment and Plan:  Pregnancy: G2P1001 at 8469w6d  1. Chronic hypertension complicating pregnancy, antepartum Reactive NST - Fetal nonstress test  Term labor symptoms and general obstetric precautions including but not limited to vaginal bleeding, contractions, leaking of fluid and fetal movement were reviewed in detail with the patient.US at [facility] today Please refer to After  Visit Summary for other counseling recommendations.  Return in about 4 days (around 07/24/2016) for NST.   Adam PhenixJames G Arnold, MD

## 2016-07-20 NOTE — Patient Instructions (Signed)
Third Trimester of Pregnancy The third trimester is from week 29 through week 40 (months 7 through 9). The third trimester is a time when the unborn baby (fetus) is growing rapidly. At the end of the ninth month, the fetus is about 20 inches in length and weighs 6-10 pounds. Body changes during your third trimester Your body goes through many changes during pregnancy. The changes vary from woman to woman. During the third trimester:  Your weight will continue to increase. You can expect to gain 25-35 pounds (11-16 kg) by the end of the pregnancy.  You may begin to get stretch marks on your hips, abdomen, and breasts.  You may urinate more often because the fetus is moving lower into your pelvis and pressing on your bladder.  You may develop or continue to have heartburn. This is caused by increased hormones that slow down muscles in the digestive tract.  You may develop or continue to have constipation because increased hormones slow digestion and cause the muscles that push waste through your intestines to relax.  You may develop hemorrhoids. These are swollen veins (varicose veins) in the rectum that can itch or be painful.  You may develop swollen, bulging veins (varicose veins) in your legs.  You may have increased body aches in the pelvis, back, or thighs. This is due to weight gain and increased hormones that are relaxing your joints.  You may have changes in your hair. These can include thickening of your hair, rapid growth, and changes in texture. Some women also have hair loss during or after pregnancy, or hair that feels dry or thin. Your hair will most likely return to normal after your baby is born.  Your breasts will continue to grow and they will continue to become tender. A yellow fluid (colostrum) may leak from your breasts. This is the first milk you are producing for your baby.  Your belly button may stick out.  You may notice more swelling in your hands, face, or  ankles.  You may have increased tingling or numbness in your hands, arms, and legs. The skin on your belly may also feel numb.  You may feel short of breath because of your expanding uterus.  You may have more problems sleeping. This can be caused by the size of your belly, increased need to urinate, and an increase in your body's metabolism.  You may notice the fetus "dropping," or moving lower in your abdomen.  You may have increased vaginal discharge.  Your cervix becomes thin and soft (effaced) near your due date. What to expect at prenatal visits You will have prenatal exams every 2 weeks until week 36. Then you will have weekly prenatal exams. During a routine prenatal visit:  You will be weighed to make sure you and the fetus are growing normally.  Your blood pressure will be taken.  Your abdomen will be measured to track your baby's growth.  The fetal heartbeat will be listened to.  Any test results from the previous visit will be discussed.  You may have a cervical check near your due date to see if you have effaced. At around 36 weeks, your health care provider will check your cervix. At the same time, your health care provider will also perform a test on the secretions of the vaginal tissue. This test is to determine if a type of bacteria, Group B streptococcus, is present. Your health care provider will explain this further. Your health care provider may ask you:    What your birth plan is.  How you are feeling.  If you are feeling the baby move.  If you have had any abnormal symptoms, such as leaking fluid, bleeding, severe headaches, or abdominal cramping.  If you are using any tobacco products, including cigarettes, chewing tobacco, and electronic cigarettes.  If you have any questions. Other tests or screenings that may be performed during your third trimester include:  Blood tests that check for low iron levels (anemia).  Fetal testing to check the health,  activity level, and growth of the fetus. Testing is done if you have certain medical conditions or if there are problems during the pregnancy.  Nonstress test (NST). This test checks the health of your baby to make sure there are no signs of problems, such as the baby not getting enough oxygen. During this test, a belt is placed around your belly. The baby is made to move, and its heart rate is monitored during movement. What is false labor? False labor is a condition in which you feel small, irregular tightenings of the muscles in the womb (contractions) that eventually go away. These are called Braxton Hicks contractions. Contractions may last for hours, days, or even weeks before true labor sets in. If contractions come at regular intervals, become more frequent, increase in intensity, or become painful, you should see your health care provider. What are the signs of labor?  Abdominal cramps.  Regular contractions that start at 10 minutes apart and become stronger and more frequent with time.  Contractions that start on the top of the uterus and spread down to the lower abdomen and back.  Increased pelvic pressure and dull back pain.  A watery or bloody mucus discharge that comes from the vagina.  Leaking of amniotic fluid. This is also known as your "water breaking." It could be a slow trickle or a gush. Let your doctor know if it has a color or strange odor. If you have any of these signs, call your health care provider right away, even if it is before your due date. Follow these instructions at home: Eating and drinking  Continue to eat regular, healthy meals.  Do not eat:  Raw meat or meat spreads.  Unpasteurized milk or cheese.  Unpasteurized juice.  Store-made salad.  Refrigerated smoked seafood.  Hot dogs or deli meat, unless they are piping hot.  More than 6 ounces of albacore tuna a week.  Shark, swordfish, king mackerel, or tile fish.  Store-made salads.  Raw  sprouts, such as mung bean or alfalfa sprouts.  Take prenatal vitamins as told by your health care provider.  Take 1000 mg of calcium daily as told by your health care provider.  If you develop constipation:  Take over-the-counter or prescription medicines.  Drink enough fluid to keep your urine clear or pale yellow.  Eat foods that are high in fiber, such as fresh fruits and vegetables, whole grains, and beans.  Limit foods that are high in fat and processed sugars, such as fried and sweet foods. Activity  Exercise only as directed by your health care provider. Healthy pregnant women should aim for 2 hours and 30 minutes of moderate exercise per week. If you experience any pain or discomfort while exercising, stop.  Avoid heavy lifting.  Do not exercise in extreme heat or humidity, or at high altitudes.  Wear low-heel, comfortable shoes.  Practice good posture.  Do not travel far distances unless it is absolutely necessary and only with the approval   of your health care provider.  Wear your seat belt at all times while in a car, on a bus, or on a plane.  Take frequent breaks and rest with your legs elevated if you have leg cramps or low back pain.  Do not use hot tubs, steam rooms, or saunas.  You may continue to have sex unless your health care provider tells you otherwise. Lifestyle  Do not use any products that contain nicotine or tobacco, such as cigarettes and e-cigarettes. If you need help quitting, ask your health care provider.  Do not drink alcohol.  Do not use any medicinal herbs or unprescribed drugs. These chemicals affect the formation and growth of the baby.  If you develop varicose veins:  Wear support pantyhose or compression stockings as told by your healthcare provider.  Elevate your feet for 15 minutes, 3-4 times a day.  Wear a supportive maternity bra to help with breast tenderness. General instructions  Take over-the-counter and prescription  medicines only as told by your health care provider. There are medicines that are either safe or unsafe to take during pregnancy.  Take warm sitz baths to soothe any pain or discomfort caused by hemorrhoids. Use hemorrhoid cream or witch hazel if your health care provider approves.  Avoid cat litter boxes and soil used by cats. These carry germs that can cause birth defects in the baby. If you have a cat, ask someone to clean the litter box for you.  To prepare for the arrival of your baby:  Take prenatal classes to understand, practice, and ask questions about the labor and delivery.  Make a trial run to the hospital.  Visit the hospital and tour the maternity area.  Arrange for maternity or paternity leave through employers.  Arrange for family and friends to take care of pets while you are in the hospital.  Purchase a rear-facing car seat and make sure you know how to install it in your car.  Pack your hospital bag.  Prepare the baby's nursery. Make sure to remove all pillows and stuffed animals from the baby's crib to prevent suffocation.  Visit your dentist if you have not gone during your pregnancy. Use a soft toothbrush to brush your teeth and be gentle when you floss.  Keep all prenatal follow-up visits as told by your health care provider. This is important. Contact a health care provider if:  You are unsure if you are in labor or if your water has broken.  You become dizzy.  You have mild pelvic cramps, pelvic pressure, or nagging pain in your abdominal area.  You have lower back pain.  You have persistent nausea, vomiting, or diarrhea.  You have an unusual or bad smelling vaginal discharge.  You have pain when you urinate. Get help right away if:  You have a fever.  You are leaking fluid from your vagina.  You have spotting or bleeding from your vagina.  You have severe abdominal pain or cramping.  You have rapid weight loss or weight gain.  You have  shortness of breath with chest pain.  You notice sudden or extreme swelling of your face, hands, ankles, feet, or legs.  Your baby makes fewer than 10 movements in 2 hours.  You have severe headaches that do not go away with medicine.  You have vision changes. Summary  The third trimester is from week 29 through week 40, months 7 through 9. The third trimester is a time when the unborn baby (fetus)   is growing rapidly.  During the third trimester, your discomfort may increase as you and your baby continue to gain weight. You may have abdominal, leg, and back pain, sleeping problems, and an increased need to urinate.  During the third trimester your breasts will keep growing and they will continue to become tender. A yellow fluid (colostrum) may leak from your breasts. This is the first milk you are producing for your baby.  False labor is a condition in which you feel small, irregular tightenings of the muscles in the womb (contractions) that eventually go away. These are called Braxton Hicks contractions. Contractions may last for hours, days, or even weeks before true labor sets in.  Signs of labor can include: abdominal cramps; regular contractions that start at 10 minutes apart and become stronger and more frequent with time; watery or bloody mucus discharge that comes from the vagina; increased pelvic pressure and dull back pain; and leaking of amniotic fluid. This information is not intended to replace advice given to you by your health care provider. Make sure you discuss any questions you have with your health care provider. Document Released: 05/22/2001 Document Revised: 11/03/2015 Document Reviewed: 07/29/2012 Elsevier Interactive Patient Education  2017 [employer]  

## 2016-07-24 ENCOUNTER — Encounter (HOSPITAL_COMMUNITY): Payer: Self-pay

## 2016-07-24 ENCOUNTER — Encounter: Payer: Self-pay | Admitting: Family Medicine

## 2016-07-24 ENCOUNTER — Ambulatory Visit (INDEPENDENT_AMBULATORY_CARE_PROVIDER_SITE_OTHER): Payer: Medicaid Other | Admitting: Family Medicine

## 2016-07-24 VITALS — BP 126/84 | HR 118

## 2016-07-24 DIAGNOSIS — O10919 Unspecified pre-existing hypertension complicating pregnancy, unspecified trimester: Secondary | ICD-10-CM

## 2016-07-24 DIAGNOSIS — O24313 Unspecified pre-existing diabetes mellitus in pregnancy, third trimester: Secondary | ICD-10-CM | POA: Diagnosis not present

## 2016-07-24 DIAGNOSIS — O0993 Supervision of high risk pregnancy, unspecified, third trimester: Secondary | ICD-10-CM

## 2016-07-24 DIAGNOSIS — O10913 Unspecified pre-existing hypertension complicating pregnancy, third trimester: Secondary | ICD-10-CM

## 2016-07-24 DIAGNOSIS — O34219 Maternal care for unspecified type scar from previous cesarean delivery: Secondary | ICD-10-CM

## 2016-07-24 DIAGNOSIS — O24319 Unspecified pre-existing diabetes mellitus in pregnancy, unspecified trimester: Secondary | ICD-10-CM

## 2016-07-24 NOTE — Patient Instructions (Signed)
Cesarean Delivery °Cesarean birth, or cesarean delivery, is the surgical delivery of a baby through an incision in the abdomen and the uterus. This may be referred to as a C-section. This procedure may be scheduled ahead of time, or it may be done in an emergency situation. °Tell a health care provider about: °· Any allergies you have. °· All medicines you are taking, including vitamins, herbs, eye drops, creams, and over-the-counter medicines. °· Any problems you or family members have had with anesthetic medicines. °· Any blood disorders you have. °· Any surgeries you have had. °· Any medical conditions you have. °· Whether you or any members of your family have a history of deep vein thrombosis (DVT) or pulmonary embolism (PE). °What are the risks? °Generally, this is a safe procedure. However, problems may occur, including: °· Infection. °· Bleeding. °· Allergic reactions to medicines. °· Damage to other structures or organs. °· Blood clots. °· Injury to your baby. ° °What happens before the procedure? °· Follow instructions from your health care provider about eating or drinking restrictions. °· Follow instructions from your health care provider about bathing before your procedure to help reduce your risk of infection. °· If you know that you are going to have a cesarean delivery, do not shave your pubic area. Shaving before the procedure may increase your risk of infection. °· Ask your health care provider about: °? Changing or stopping your regular medicines. This is especially important if you are taking diabetes medicines or blood thinners. °? Your pain management plan. This is especially important if you plan to breastfeed your baby. °? How long you will be in the hospital after the procedure. °? Any concerns you may have about receiving blood products if you need them during the procedure. °? Cord blood banking, if you plan to collect your baby’s umbilical cord blood. °· You may also want to ask your  health care provider: °? Whether you will be able to hold or breastfeed your baby while you are still in the operating room. °? Whether your baby can stay with you immediately after the procedure and during your recovery. °? Whether a family member or a person of your choice can go with you into the operating room and stay with you during the procedure, immediately after the procedure, and during your recovery. °· Plan to have someone drive you home when you are discharged from the hospital. °What happens during the procedure? °· Fetal monitors will be placed on your abdomen to monitor your heart rate and your baby's heart rate. °· Depending on the reason for your cesarean delivery, you may have a physical exam or additional testing, such as an ultrasound. °· An IV tube will be inserted into one of your veins. °· You may have your blood or urine tested. °· You will be given antibiotic medicine to help prevent infection. °· You may be given a special warming gown to wear to keep your temperature stable. °· Hair may be removed from your pubic area. °· The skin of your pubic area and lower abdomen will be cleaned with a germ-killing solution (antiseptic). °· A catheter may be inserted into your bladder through your urethra. This drains your urine during the procedure. °· You may be given one or more of the following: °? A medicine to numb the area (local anesthetic). °? A medicine to make you fall asleep (general anesthetic). °? A medicine (regional anesthetic) that is injected into your back or through a small   thin tube placed in your back (spinal anesthetic or epidural anesthetic). This numbs everything below the injection site and allows you to stay awake during your procedure. If this makes you feel nauseous, tell your health care provider. Medicines will be available to help reduce any nausea you may feel. °· An incision will be made in your abdomen, and then in your uterus. °· If you are awake during your  procedure, you may feel tugging and pulling in your abdomen, but you should not feel pain. If you feel pain, tell your health care provider immediately. °· Your baby will be removed from your uterus. You may feel more pressure or pushing while this happens. °· Immediately after birth, your baby will be dried and kept warm. You may be able to hold and breastfeed your baby. The umbilical cord may be clamped and cut during this time. °· Your placenta will be removed from your uterus. °· Your incisions will be closed with stitches (sutures). Staples, skin glue, or adhesive strips may also be applied to the incision in your abdomen. °· Bandages (dressings) will be placed over the incision in your abdomen. °The procedure may vary among health care providers and hospitals. °What happens after the procedure? °· Your blood pressure, heart rate, breathing rate, and blood oxygen level will be monitored often until the medicines you were given have worn off. °· You may continue to receive fluids and medicines through an IV tube. °· You will have some pain. Medicines will be available to help control your pain. °· To help prevent blood clots: °? You may be given medicines. °? You may have to wear compression stockings or devices. °? You will be encouraged to walk around when you are able. °· Hospital staff will encourage and support bonding with your baby. Your hospital may allow you and your baby to stay in the same room (rooming in) during your hospital stay to encourage successful breastfeeding. °· You may be encouraged to cough and breathe deeply often. This helps to prevent lung problems. °· If you have a catheter draining your urine, it will be removed as soon as possible after your procedure. °This information is not intended to replace advice given to you by your health care provider. Make sure you discuss any questions you have with your health care provider. °Document Released: 05/28/2005 Document Revised: 11/03/2015  Document Reviewed: 03/08/2015 °Elsevier Interactive Patient Education © 2017 [employer] ° °

## 2016-07-24 NOTE — Progress Notes (Signed)
   PRENATAL VISIT NOTE  Subjective:  Joyce Green is a 31 y.o. G2P1001 at 6144w3d being seen today for ongoing prenatal care.  She is currently monitored for the following issues for this high-risk pregnancy and has Preexisting diabetes complicating pregnancy, antepartum; Obesity (BMI 30-39.9); Supervision of high-risk pregnancy; Chronic hypertension complicating pregnancy, antepartum; Previous cesarean delivery for fetal intolerance of labor, antepartum; and Obesity in pregnancy, antepartum on her problem list.  Patient reports no complaints.  Contractions: Irregular. Vag. Bleeding: None.  Movement: Present. Denies leaking of fluid.   The following portions of the patient's history were reviewed and updated as appropriate: allergies, current medications, past family history, past medical history, past social history, past surgical history and problem list. Problem list updated.  Objective:   Vitals:   07/24/16 1113 07/24/16 1137  BP: (!) 132/94 126/84  Pulse: (!) 118     Fetal Status: Fetal Heart Rate (bpm): NST   Movement: Present     General:  Alert, oriented and cooperative. Patient is in no acute distress.  Skin: Skin is warm and dry. No rash noted.   Cardiovascular: Normal heart rate noted  Respiratory: Normal respiratory effort, no problems with respiration noted  Abdomen: Soft, gravid, appropriate for gestational age. Pain/Pressure: Present     Pelvic:  Cervical exam deferred        Extremities: Normal range of motion.  Edema: Trace  Mental Status: Normal mood and affect. Normal behavior. Normal judgment and thought content.  NST reviewed and reactive. BS are 70s fasting and 120-125 pp  Assessment and Plan:  Pregnancy: G2P1001 at 7944w3d  1. Preexisting diabetes complicating pregnancy, antepartum Continue Lantus and metformin - Fetal nonstress test  2. Chronic hypertension complicating pregnancy, antepartum BP ok on no meds  3. Supervision of high risk pregnancy in  third trimester   4. Previous cesarean delivery for fetal intolerance of labor, antepartum EFW 9 lb 7 oz--advised to have repeat C-section--she desires, will change to C-section. Booked for Saturday with Dr. Jolayne Pantheronstant  Preterm labor symptoms and general obstetric precautions including but not limited to vaginal bleeding, contractions, leaking of fluid and fetal movement were reviewed in detail with the patient. Please refer to After Visit Summary for other counseling recommendations.  Return for NST/AFI only in 3 days.   Reva Boresanya S Katie Faraone, MD

## 2016-07-25 NOTE — Addendum Note (Signed)
Addended by: Reva BoresPRATT, Malcolm Quast S on: 07/25/2016 05:15 PM   Modules accepted: Orders, SmartSet

## 2016-07-26 ENCOUNTER — Encounter (HOSPITAL_COMMUNITY)
Admission: RE | Admit: 2016-07-26 | Discharge: 2016-07-26 | Disposition: A | Discharge status: Home / Self Care | Payer: Medicaid Other | Source: Ambulatory Visit | Attending: Obstetrics & Gynecology | Admitting: Obstetrics & Gynecology

## 2016-07-26 DIAGNOSIS — O99213 Obesity complicating pregnancy, third trimester: Secondary | ICD-10-CM | POA: Insufficient documentation

## 2016-07-26 DIAGNOSIS — O09893 Supervision of other high risk pregnancies, third trimester: Secondary | ICD-10-CM | POA: Diagnosis not present

## 2016-07-26 DIAGNOSIS — O24113 Pre-existing diabetes mellitus, type 2, in pregnancy, third trimester: Secondary | ICD-10-CM | POA: Insufficient documentation

## 2016-07-26 DIAGNOSIS — Z7984 Long term (current) use of oral hypoglycemic drugs: Secondary | ICD-10-CM | POA: Insufficient documentation

## 2016-07-26 DIAGNOSIS — Z01812 Encounter for preprocedural laboratory examination: Secondary | ICD-10-CM | POA: Insufficient documentation

## 2016-07-26 DIAGNOSIS — E119 Type 2 diabetes mellitus without complications: Secondary | ICD-10-CM | POA: Diagnosis not present

## 2016-07-26 DIAGNOSIS — O34219 Maternal care for unspecified type scar from previous cesarean delivery: Secondary | ICD-10-CM | POA: Insufficient documentation

## 2016-07-26 DIAGNOSIS — Z3A38 38 weeks gestation of pregnancy: Secondary | ICD-10-CM | POA: Insufficient documentation

## 2016-07-26 DIAGNOSIS — O10913 Unspecified pre-existing hypertension complicating pregnancy, third trimester: Secondary | ICD-10-CM | POA: Insufficient documentation

## 2016-07-26 LAB — BASIC METABOLIC PANEL
Anion gap: 6 (ref 5–15)
BUN: 10 mg/dL (ref 6–20)
CHLORIDE: 108 mmol/L (ref 101–111)
CO2: 20 mmol/L — AB (ref 22–32)
Calcium: 8.2 mg/dL — ABNORMAL LOW (ref 8.9–10.3)
Creatinine, Ser: 0.57 mg/dL (ref 0.44–1.00)
Glucose, Bld: 107 mg/dL — ABNORMAL HIGH (ref 65–99)
Potassium: 4 mmol/L (ref 3.5–5.1)
Sodium: 134 mmol/L — ABNORMAL LOW (ref 135–145)

## 2016-07-26 LAB — CBC
HEMATOCRIT: 30.9 % — AB (ref 36.0–46.0)
HEMOGLOBIN: 10.4 g/dL — AB (ref 12.0–15.0)
MCH: 25.1 pg — ABNORMAL LOW (ref 26.0–34.0)
MCHC: 33.7 g/dL (ref 30.0–36.0)
MCV: 74.6 fL — ABNORMAL LOW (ref 78.0–100.0)
Platelets: 198 10*3/uL (ref 150–400)
RBC: 4.14 MIL/uL (ref 3.87–5.11)
RDW: 14.4 % (ref 11.5–15.5)
WBC: 9.7 10*3/uL (ref 4.0–10.5)

## 2016-07-26 NOTE — Patient Instructions (Signed)
20 Joyce Green  07/26/2016   Your procedure is scheduled on:  07/28/2016  [facility]  Pick up the phone at the desk and dial [phone].   Call this number if you have problems the morning of surgery: [phone]   Remember:   Do not eat food:After Midnight.  Do not drink clear liquids: 6 Hours before arrival.  Take these medicines the morning of surgery with A SIP OF WATER: no metformin Friday night or Saturday morning.  Take 9 units of Lantus insulin at bedtime Friday night and no insulin on Saturday morning.   Do not wear jewelry, make-up or nail polish.  Do not wear lotions, powders, or perfumes. Do not wear deodorant.  Do not shave 48 hours prior to surgery.  Do not bring valuables to the hospital.  [facility] not   responsible for any belongings or valuables brought to the hospital.  Contacts, dentures or bridgework may not be worn into surgery.  Leave suitcase in the car. After surgery it may be brought to your room.  For patients admitted to the hospital, checkout time is 11:00 AM the day of              discharge.   Patients discharged the day of surgery will not be allowed to drive             home.  Name and phone number of your driver: na  Special Instructions:   N/A   Please read over the following fact sheets that you were given:   Surgical Site Infection Prevention

## 2016-07-27 ENCOUNTER — Other Ambulatory Visit: Payer: Medicaid Other

## 2016-07-27 ENCOUNTER — Ambulatory Visit (HOSPITAL_COMMUNITY): Payer: Medicaid Other

## 2016-07-27 LAB — RPR: RPR Ser Ql: NONREACTIVE

## 2016-07-28 ENCOUNTER — Inpatient Hospital Stay (HOSPITAL_COMMUNITY): Payer: Medicaid Other | Admitting: Anesthesiology

## 2016-07-28 ENCOUNTER — Encounter (HOSPITAL_COMMUNITY): Payer: Self-pay | Admitting: *Deleted

## 2016-07-28 ENCOUNTER — Inpatient Hospital Stay (HOSPITAL_COMMUNITY): Admission: RE | Admit: 2016-07-28 | Payer: Medicaid Other | Source: Ambulatory Visit

## 2016-07-28 ENCOUNTER — Inpatient Hospital Stay (HOSPITAL_COMMUNITY)
Admission: RE | Admit: 2016-07-28 | Discharge: 2016-07-31 | DRG: 765 | Disposition: A | Discharge status: Home / Self Care | Payer: Medicaid Other | Source: Ambulatory Visit | Attending: Obstetrics and Gynecology | Admitting: Obstetrics and Gynecology

## 2016-07-28 ENCOUNTER — Encounter (HOSPITAL_COMMUNITY)
Admission: RE | Disposition: A | Discharge status: Home / Self Care | Payer: Self-pay | Source: Ambulatory Visit | Attending: Obstetrics and Gynecology

## 2016-07-28 DIAGNOSIS — K219 Gastro-esophageal reflux disease without esophagitis: Secondary | ICD-10-CM | POA: Diagnosis present

## 2016-07-28 DIAGNOSIS — Z8249 Family history of ischemic heart disease and other diseases of the circulatory system: Secondary | ICD-10-CM

## 2016-07-28 DIAGNOSIS — O34211 Maternal care for low transverse scar from previous cesarean delivery: Principal | ICD-10-CM | POA: Diagnosis present

## 2016-07-28 DIAGNOSIS — E119 Type 2 diabetes mellitus without complications: Secondary | ICD-10-CM | POA: Diagnosis present

## 2016-07-28 DIAGNOSIS — O34219 Maternal care for unspecified type scar from previous cesarean delivery: Secondary | ICD-10-CM | POA: Diagnosis not present

## 2016-07-28 DIAGNOSIS — O9962 Diseases of the digestive system complicating childbirth: Secondary | ICD-10-CM | POA: Diagnosis present

## 2016-07-28 DIAGNOSIS — O1002 Pre-existing essential hypertension complicating childbirth: Secondary | ICD-10-CM | POA: Diagnosis present

## 2016-07-28 DIAGNOSIS — Z98891 History of uterine scar from previous surgery: Secondary | ICD-10-CM

## 2016-07-28 DIAGNOSIS — Z833 Family history of diabetes mellitus: Secondary | ICD-10-CM

## 2016-07-28 DIAGNOSIS — Z87891 Personal history of nicotine dependence: Secondary | ICD-10-CM

## 2016-07-28 DIAGNOSIS — Z6836 Body mass index (BMI) 36.0-36.9, adult: Secondary | ICD-10-CM

## 2016-07-28 DIAGNOSIS — O99214 Obesity complicating childbirth: Secondary | ICD-10-CM | POA: Diagnosis present

## 2016-07-28 DIAGNOSIS — Z794 Long term (current) use of insulin: Secondary | ICD-10-CM

## 2016-07-28 DIAGNOSIS — Z3A39 39 weeks gestation of pregnancy: Secondary | ICD-10-CM

## 2016-07-28 DIAGNOSIS — O1092 Unspecified pre-existing hypertension complicating childbirth: Secondary | ICD-10-CM

## 2016-07-28 DIAGNOSIS — O2412 Pre-existing diabetes mellitus, type 2, in childbirth: Secondary | ICD-10-CM | POA: Diagnosis present

## 2016-07-28 LAB — GLUCOSE, CAPILLARY
GLUCOSE-CAPILLARY: 79 mg/dL (ref 65–99)
GLUCOSE-CAPILLARY: 142 mg/dL — AB (ref 65–99)
GLUCOSE-CAPILLARY: 244 mg/dL — AB (ref 65–99)

## 2016-07-28 LAB — PREPARE RBC (CROSSMATCH)

## 2016-07-28 SURGERY — Surgical Case
Anesthesia: Spinal

## 2016-07-28 MED ORDER — ONDANSETRON HCL 4 MG/2ML IJ SOLN
INTRAMUSCULAR | Status: AC
  Filled 2016-07-28: qty 2

## 2016-07-28 MED ORDER — FENTANYL CITRATE (PF) 100 MCG/2ML IJ SOLN
INTRAMUSCULAR | Status: DC | PRN
  Administered 2016-07-28: 10 ug via INTRATHECAL

## 2016-07-28 MED ORDER — WITCH HAZEL-GLYCERIN EX PADS: 1.0000 "application " | MEDICATED_PAD | CUTANEOUS | Status: DC | PRN

## 2016-07-28 MED ORDER — KETOROLAC TROMETHAMINE 30 MG/ML IJ SOLN: 30.0000 mg | Freq: Four times a day (QID) | INTRAMUSCULAR | Status: AC | PRN

## 2016-07-28 MED ORDER — PRENATAL MULTIVITAMIN CH
1.0000 | ORAL_TABLET | Freq: Every day | ORAL | Status: DC
  Administered 2016-07-29 – 2016-07-31 (×3): 1 via ORAL
  Filled 2016-07-28 (×3): qty 1

## 2016-07-28 MED ORDER — OXYTOCIN 10 UNIT/ML IJ SOLN
INTRAVENOUS | Status: DC | PRN
  Administered 2016-07-28: 40 [IU] via INTRAVENOUS

## 2016-07-28 MED ORDER — LACTATED RINGERS IV SOLN
INTRAVENOUS | Status: DC
  Administered 2016-07-28: 13:00:00 via INTRAVENOUS

## 2016-07-28 MED ORDER — NALBUPHINE HCL 10 MG/ML IJ SOLN
5.0000 mg | INTRAMUSCULAR | Status: DC | PRN
  Administered 2016-07-28 – 2016-07-29 (×2): 5 mg via INTRAVENOUS
  Filled 2016-07-28 (×2): qty 1

## 2016-07-28 MED ORDER — BUPIVACAINE IN DEXTROSE 0.75-8.25 % IT SOLN
INTRATHECAL | Status: DC | PRN
  Administered 2016-07-28: 12 mg via INTRATHECAL

## 2016-07-28 MED ORDER — MENTHOL 3 MG MT LOZG: 1.0000 | LOZENGE | OROMUCOSAL | Status: DC | PRN

## 2016-07-28 MED ORDER — METFORMIN HCL 500 MG PO TABS
1000.0000 mg | ORAL_TABLET | Freq: Two times a day (BID) | ORAL | Status: DC
  Administered 2016-07-29 – 2016-07-31 (×4): 1000 mg via ORAL
  Filled 2016-07-28 (×7): qty 2

## 2016-07-28 MED ORDER — CEFAZOLIN SODIUM-DEXTROSE 2-4 GM/100ML-% IV SOLN: 2.0000 g | INTRAVENOUS | Status: DC

## 2016-07-28 MED ORDER — OXYCODONE HCL 5 MG PO TABS
10.0000 mg | ORAL_TABLET | ORAL | Status: DC | PRN
  Administered 2016-07-30 – 2016-07-31 (×2): 10 mg via ORAL
  Filled 2016-07-28 (×2): qty 2

## 2016-07-28 MED ORDER — OXYTOCIN 40 UNITS IN LACTATED RINGERS INFUSION - SIMPLE MED: 2.5000 [IU]/h | INTRAVENOUS | Status: AC

## 2016-07-28 MED ORDER — CEFAZOLIN SODIUM-DEXTROSE 2-4 GM/100ML-% IV SOLN
2.0000 g | INTRAVENOUS | Status: AC
  Administered 2016-07-28: 2 g via INTRAVENOUS
  Filled 2016-07-28: qty 100

## 2016-07-28 MED ORDER — DEXAMETHASONE SODIUM PHOSPHATE 10 MG/ML IJ SOLN
INTRAMUSCULAR | Status: DC | PRN
  Administered 2016-07-28: 10 mg via INTRAVENOUS

## 2016-07-28 MED ORDER — SENNOSIDES-DOCUSATE SODIUM 8.6-50 MG PO TABS
2.0000 | ORAL_TABLET | ORAL | Status: DC
  Administered 2016-07-29 – 2016-07-31 (×3): 2 via ORAL
  Filled 2016-07-28 (×3): qty 2

## 2016-07-28 MED ORDER — NALBUPHINE HCL 10 MG/ML IJ SOLN: 5.0000 mg | Freq: Once | INTRAMUSCULAR | Status: DC | PRN

## 2016-07-28 MED ORDER — SODIUM CHLORIDE 0.9 % IR SOLN
Status: DC | PRN
  Administered 2016-07-28: 1000 mL

## 2016-07-28 MED ORDER — NALOXONE HCL 0.4 MG/ML IJ SOLN: 0.4000 mg | INTRAMUSCULAR | Status: DC | PRN

## 2016-07-28 MED ORDER — INSULIN GLARGINE 100 UNIT/ML ~~LOC~~ SOLN
10.0000 [IU] | Freq: Every day | SUBCUTANEOUS | Status: DC
  Administered 2016-07-28 – 2016-07-31 (×3): 10 [IU] via SUBCUTANEOUS
  Filled 2016-07-28 (×5): qty 0.1

## 2016-07-28 MED ORDER — LACTATED RINGERS IV SOLN: INTRAVENOUS | Status: DC

## 2016-07-28 MED ORDER — IBUPROFEN 600 MG PO TABS
600.0000 mg | ORAL_TABLET | Freq: Four times a day (QID) | ORAL | Status: DC
  Administered 2016-07-28 – 2016-07-31 (×12): 600 mg via ORAL
  Filled 2016-07-28 (×12): qty 1

## 2016-07-28 MED ORDER — OXYTOCIN 10 UNIT/ML IJ SOLN
INTRAMUSCULAR | Status: AC
  Filled 2016-07-28: qty 4

## 2016-07-28 MED ORDER — LACTATED RINGERS IV SOLN
INTRAVENOUS | Status: DC
  Administered 2016-07-28: 18:00:00 via INTRAVENOUS
  Administered 2016-07-29: 900 mL via INTRAVENOUS

## 2016-07-28 MED ORDER — ONDANSETRON HCL 4 MG/2ML IJ SOLN
INTRAMUSCULAR | Status: DC | PRN
  Administered 2016-07-28: 4 mg via INTRAVENOUS

## 2016-07-28 MED ORDER — PROMETHAZINE HCL 25 MG/ML IJ SOLN: 6.2500 mg | INTRAMUSCULAR | Status: DC | PRN

## 2016-07-28 MED ORDER — SIMETHICONE 80 MG PO CHEW
80.0000 mg | CHEWABLE_TABLET | Freq: Three times a day (TID) | ORAL | Status: DC
  Administered 2016-07-29 – 2016-07-31 (×8): 80 mg via ORAL
  Filled 2016-07-28 (×9): qty 1

## 2016-07-28 MED ORDER — KETOROLAC TROMETHAMINE 30 MG/ML IJ SOLN
INTRAMUSCULAR | Status: AC
  Administered 2016-07-28: 30 mg via INTRAMUSCULAR
  Filled 2016-07-28: qty 1

## 2016-07-28 MED ORDER — MIDAZOLAM HCL 2 MG/2ML IJ SOLN: 0.5000 mg | Freq: Once | INTRAMUSCULAR | Status: DC | PRN

## 2016-07-28 MED ORDER — LACTATED RINGERS IV SOLN
INTRAVENOUS | Status: DC | PRN
  Administered 2016-07-28 (×3): via INTRAVENOUS

## 2016-07-28 MED ORDER — PHENYLEPHRINE 8 MG IN D5W 100 ML (0.08MG/ML) PREMIX OPTIME
INJECTION | INTRAVENOUS | Status: AC
  Filled 2016-07-28: qty 100

## 2016-07-28 MED ORDER — FENTANYL CITRATE (PF) 100 MCG/2ML IJ SOLN
INTRAMUSCULAR | Status: AC
  Filled 2016-07-28: qty 2

## 2016-07-28 MED ORDER — MORPHINE SULFATE (PF) 0.5 MG/ML IJ SOLN
INTRAMUSCULAR | Status: AC
  Filled 2016-07-28: qty 10

## 2016-07-28 MED ORDER — DIPHENHYDRAMINE HCL 25 MG PO CAPS: 25.0000 mg | ORAL_CAPSULE | Freq: Four times a day (QID) | ORAL | Status: DC | PRN

## 2016-07-28 MED ORDER — DIBUCAINE 1 % RE OINT: 1.0000 "application " | TOPICAL_OINTMENT | RECTAL | Status: DC | PRN

## 2016-07-28 MED ORDER — MORPHINE SULFATE (PF) 0.5 MG/ML IJ SOLN
INTRAMUSCULAR | Status: DC | PRN
  Administered 2016-07-28: .2 mg via INTRATHECAL

## 2016-07-28 MED ORDER — MEPERIDINE HCL 25 MG/ML IJ SOLN: 6.2500 mg | INTRAMUSCULAR | Status: DC | PRN

## 2016-07-28 MED ORDER — SIMETHICONE 80 MG PO CHEW: 80.0000 mg | CHEWABLE_TABLET | ORAL | Status: DC | PRN

## 2016-07-28 MED ORDER — SCOPOLAMINE 1 MG/3DAYS TD PT72
1.0000 | MEDICATED_PATCH | Freq: Once | TRANSDERMAL | Status: AC
  Administered 2016-07-28: 1.5 mg via TRANSDERMAL

## 2016-07-28 MED ORDER — DEXAMETHASONE SODIUM PHOSPHATE 10 MG/ML IJ SOLN
INTRAMUSCULAR | Status: AC
  Filled 2016-07-28: qty 1

## 2016-07-28 MED ORDER — SODIUM CHLORIDE 0.9% FLUSH
3.0000 mL | INTRAVENOUS | Status: DC | PRN
Start: 2016-07-28 — End: 2016-07-31

## 2016-07-28 MED ORDER — MORPHINE SULFATE (PF) 4 MG/ML IV SOLN: 1.0000 mg | INTRAVENOUS | Status: DC | PRN

## 2016-07-28 MED ORDER — EPHEDRINE SULFATE 50 MG/ML IJ SOLN
INTRAMUSCULAR | Status: DC | PRN
  Administered 2016-07-28 (×2): 5 mg via INTRAVENOUS

## 2016-07-28 MED ORDER — COCONUT OIL OIL: 1.0000 "application " | TOPICAL_OIL | Status: DC | PRN

## 2016-07-28 MED ORDER — ONDANSETRON HCL 4 MG/2ML IJ SOLN: 4.0000 mg | Freq: Three times a day (TID) | INTRAMUSCULAR | Status: DC | PRN

## 2016-07-28 MED ORDER — DIPHENHYDRAMINE HCL 50 MG/ML IJ SOLN: 12.5000 mg | INTRAMUSCULAR | Status: DC | PRN

## 2016-07-28 MED ORDER — OXYCODONE HCL 5 MG PO TABS
5.0000 mg | ORAL_TABLET | ORAL | Status: DC | PRN
  Administered 2016-07-29 – 2016-07-31 (×6): 5 mg via ORAL
  Filled 2016-07-28 (×6): qty 1

## 2016-07-28 MED ORDER — DIPHENHYDRAMINE HCL 25 MG PO CAPS: 25.0000 mg | ORAL_CAPSULE | ORAL | Status: DC | PRN

## 2016-07-28 MED ORDER — KETOROLAC TROMETHAMINE 30 MG/ML IJ SOLN
30.0000 mg | Freq: Four times a day (QID) | INTRAMUSCULAR | Status: AC | PRN
  Administered 2016-07-28: 30 mg via INTRAMUSCULAR

## 2016-07-28 MED ORDER — NALOXONE HCL 2 MG/2ML IJ SOSY: 1.0000 ug/kg/h | PREFILLED_SYRINGE | INTRAVENOUS | Status: DC | PRN

## 2016-07-28 MED ORDER — SCOPOLAMINE 1 MG/3DAYS TD PT72
MEDICATED_PATCH | TRANSDERMAL | Status: AC
  Filled 2016-07-28: qty 1

## 2016-07-28 MED ORDER — SCOPOLAMINE 1 MG/3DAYS TD PT72
MEDICATED_PATCH | TRANSDERMAL | Status: DC | PRN
  Administered 2016-07-28: 1 via TRANSDERMAL

## 2016-07-28 MED ORDER — TETANUS-DIPHTH-ACELL PERTUSSIS 5-2.5-18.5 LF-MCG/0.5 IM SUSP: 0.5000 mL | Freq: Once | INTRAMUSCULAR | Status: DC

## 2016-07-28 MED ORDER — ACETAMINOPHEN 325 MG PO TABS
650.0000 mg | ORAL_TABLET | ORAL | Status: DC | PRN
  Administered 2016-07-28 – 2016-07-31 (×7): 650 mg via ORAL
  Filled 2016-07-28 (×7): qty 2

## 2016-07-28 MED ORDER — SIMETHICONE 80 MG PO CHEW
80.0000 mg | CHEWABLE_TABLET | ORAL | Status: DC
  Administered 2016-07-29 – 2016-07-31 (×3): 80 mg via ORAL
  Filled 2016-07-28 (×3): qty 1

## 2016-07-28 MED ORDER — NALBUPHINE HCL 10 MG/ML IJ SOLN
5.0000 mg | INTRAMUSCULAR | Status: DC | PRN
  Administered 2016-07-29 (×2): 5 mg via SUBCUTANEOUS
  Filled 2016-07-28 (×2): qty 1

## 2016-07-28 MED ORDER — PHENYLEPHRINE 8 MG IN D5W 100 ML (0.08MG/ML) PREMIX OPTIME
INJECTION | INTRAVENOUS | Status: DC | PRN
  Administered 2016-07-28: 60 ug/min via INTRAVENOUS

## 2016-07-28 SURGICAL SUPPLY — 29 items
BENZOIN TINCTURE PRP APPL 2/3 (GAUZE/BANDAGES/DRESSINGS) ×2 IMPLANT
CHLORAPREP W/TINT 26ML (MISCELLANEOUS) ×2 IMPLANT
CLAMP CORD UMBIL (MISCELLANEOUS) IMPLANT
CLOSURE STERI STRIP 1/2 X4 (GAUZE/BANDAGES/DRESSINGS) ×2 IMPLANT
CONTAINER PREFILL 10% NBF 15ML (MISCELLANEOUS) IMPLANT
DRSG OPSITE POSTOP 4X10 (GAUZE/BANDAGES/DRESSINGS) ×2 IMPLANT
ELECT REM PT RETURN 9FT ADLT (ELECTROSURGICAL) ×2
ELECTRODE REM PT RTRN 9FT ADLT (ELECTROSURGICAL) ×1 IMPLANT
EXTRACTOR VACUUM M CUP 4 TUBE (SUCTIONS) IMPLANT
GLOVE BIOGEL PI IND STRL 6.5 (GLOVE) ×1 IMPLANT
GLOVE BIOGEL PI IND STRL 7.0 (GLOVE) ×1 IMPLANT
GLOVE BIOGEL PI INDICATOR 6.5 (GLOVE) ×1
GLOVE BIOGEL PI INDICATOR 7.0 (GLOVE) ×1
GLOVE SURG SS PI 6.0 STRL IVOR (GLOVE) ×2 IMPLANT
GOWN STRL REUS W/TWL LRG LVL3 (GOWN DISPOSABLE) ×4 IMPLANT
KIT ABG SYR 3ML LUER SLIP (SYRINGE) IMPLANT
NEEDLE HYPO 25X5/8 SAFETYGLIDE (NEEDLE) IMPLANT
NS IRRIG 1000ML POUR BTL (IV SOLUTION) ×2 IMPLANT
PACK C SECTION WH (CUSTOM PROCEDURE TRAY) ×2 IMPLANT
PAD ABD 7.5X8 STRL (GAUZE/BANDAGES/DRESSINGS) ×2 IMPLANT
PAD OB MATERNITY 4.3X12.25 (PERSONAL CARE ITEMS) ×2 IMPLANT
PENCIL SMOKE EVAC W/HOLSTER (ELECTROSURGICAL) ×2 IMPLANT
RTRCTR C-SECT PINK 25CM LRG (MISCELLANEOUS) IMPLANT
SEPRAFILM MEMBRANE 5X6 (MISCELLANEOUS) IMPLANT
SUT PLAIN 0 NONE (SUTURE) IMPLANT
SUT VIC AB 0 CT1 36 (SUTURE) ×8 IMPLANT
SUT VIC AB 4-0 KS 27 (SUTURE) ×2 IMPLANT
TOWEL OR 17X24 6PK STRL BLUE (TOWEL DISPOSABLE) ×2 IMPLANT
TRAY FOLEY CATH SILVER 14FR (SET/KITS/TRAYS/PACK) ×2 IMPLANT

## 2016-07-28 NOTE — Progress Notes (Addendum)
NOtified Dr Marcy Sirenatherine Wallace of patient's blood sugar of 242 2 hours post prandial. She is due for 10 unit of Lantus now.  Give and take another blood sugar in 2 hours.  Jtwells, rn

## 2016-07-28 NOTE — Lactation Note (Signed)
This note was copied from a baby's chart. Lactation Consultation Note  Initial visit at 9 hours of age.  Baby is in NICU due to unstable blood sugar levels.  Mom has been hand expressing with colostrum containers at bedside and LC provided additional ones.  LC encouraged mom to pump 8X/24 hours while baby is in NICU.  Mom wants to use her personal spectra DEBP, LC offered hospital grade pump and discussed benefits mom declined.  LC discussed N[facility] booklet and [facility] resources.  LC provided mom with # stickers to put on containers of EBM when brought to NICU.   LC discussed storage guidelines of EBM.  Mom denies concerns at this time.  LC reported to RN.      Patient Name: Joyce Green UUVOZ'DToday's Date: 07/28/2016 Reason for consult: Initial assessment;NICU baby   Maternal Data Has patient been taught Hand Expression?: Yes  Feeding Feeding Type: Bottle Fed - Formula Nipple Type: Slow - flow Length of feed: 10 min  LATCH Score/Interventions                      Lactation Tools Discussed/Used Pump Review: Milk Storage Initiated by:: JS Date initiated:: 07/28/16   Consult Status Consult Status: Follow-up Date: 07/29/16 Follow-up type: In-patient    Joyce Green, Joyce Green 07/28/2016, 10:35 PM

## 2016-07-28 NOTE — Anesthesia Preprocedure Evaluation (Signed)
Anesthesia Evaluation  Patient identified by MRN, date of birth, ID band Patient awake    Reviewed: Allergy & Precautions, NPO status , Patient's Chart, lab work & pertinent test results  History of Anesthesia Complications Negative for: history of anesthetic complications  Airway Mallampati: I  TM Distance: >3 FB Neck ROM: Full    Dental  (+) Dental Advisory Given   Pulmonary former smoker,    breath sounds clear to auscultation       Cardiovascular hypertension (no longer on meds),  Rhythm:Regular Rate:Normal     Neuro/Psych negative neurological ROS     GI/Hepatic Neg liver ROS, GERD  ,  Endo/Other  diabetes (glu 79), Insulin Dependent, Oral Hypoglycemic AgentsMorbid obesity  Renal/GU negative Renal ROS     Musculoskeletal   Abdominal (+) + obese,   Peds  Hematology plt 198K   Anesthesia Other Findings   Reproductive/Obstetrics (+) Pregnancy                             Anesthesia Physical Anesthesia Plan  ASA: III  Anesthesia Plan: Spinal   Post-op Pain Management:    Induction:   Airway Management Planned: Natural Airway  Additional Equipment:   Intra-op Plan:   Post-operative Plan:   Informed Consent: I have reviewed the patients History and Physical, chart, labs and discussed the procedure including the risks, benefits and alternatives for the proposed anesthesia with the patient or authorized representative who has indicated his/her understanding and acceptance.   Dental advisory given  Plan Discussed with: CRNA and Surgeon  Anesthesia Plan Comments: (Plan routine monitors, SAB)        Anesthesia Quick Evaluation

## 2016-07-28 NOTE — Anesthesia Postprocedure Evaluation (Signed)
Anesthesia Post Note  Patient: Joyce Green  Procedure(s) Performed: Procedure(s) (LRB): CESAREAN SECTION (N/A)  Patient location during evaluation: PACU Anesthesia Type: Spinal Level of consciousness: awake and alert, oriented and patient cooperative Pain management: pain level controlled Vital Signs Assessment: post-procedure vital signs reviewed and stable Respiratory status: spontaneous breathing, nonlabored ventilation and respiratory function stable Cardiovascular status: blood pressure returned to baseline and stable Postop Assessment: spinal receding, no signs of nausea or vomiting and patient able to bend at knees Anesthetic complications: no        Last Vitals:  Vitals:   07/28/16 1445 07/28/16 1630  BP: 122/83 128/89  Pulse: 95 (!) 105  Resp: 16 17  Temp:      Last Pain:  Vitals:   07/28/16 1415  TempSrc: Oral  PainSc:    Pain Goal:                 Sugey Trevathan,E. Hally Colella

## 2016-07-28 NOTE — Anesthesia Procedure Notes (Signed)
Spinal  Patient location during procedure: OR End time: 07/28/2016 12:07 PM Staffing Anesthesiologist: Jairo BenJACKSON, [name] Performed: anesthesiologist  Preanesthetic Checklist Completed: patient identified, surgical consent, pre-op evaluation, timeout performed, IV checked, risks and benefits discussed and monitors and equipment checked Spinal Block Patient position: sitting Prep: site prepped and draped and DuraPrep Patient monitoring: heart rate, cardiac monitor, continuous pulse ox and blood pressure Approach: midline Location: L3-4 Injection technique: single-shot Needle Needle type: Pencan  Needle gauge: 24 G Needle length: 9 cm Assessment Sensory level: T4 Additional Notes Pt identified in Operating room.  Monitors applied. Working IV access confirmed. Sterile prep, drape lumbar spine.  1% lido local L 3,4.  #24ga Pencan into clear CSF L 3,4.  12mg  0.75% Bupivacaine with dextrose, fentanyl, morphine injected with asp CSF beginning and end of injection.  Patient asymptomatic, VSS, no heme aspirated, tolerated well.  Joyce Craze Dilon Lank, MD

## 2016-07-28 NOTE — H&P (Addendum)
Joyce Green is a 31 y.o. female presenting for G2P1 at 6139 weeks here for repeat cesarean section. Patient with prenatal care since 12 weeks [facility] type[facility] on [facility]. Patient is currently without any complaints. [facility] mind given EFW 9lb7oz.  OB History    Gravida Para Term Preterm AB Living   2 1 1     1    SAB TAB Ectopic Multiple Live Births           1     Past Medical History:  Diagnosis Date  . Diabetes mellitus without complication (HCC)   . Hypertension   . Palpitations    Past Surgical History:  Procedure Laterality Date  . CESAREAN SECTION N/A 02/20/2014   Procedure: CESAREAN SECTION;  Surgeon: Lazaro ArmsLuther H Eure, MD;  Location: [facility];  Service: Obstetrics;  Laterality: N/A;   Family History: family history includes Cancer in her maternal grandfather, maternal grandmother, paternal grandfather, and paternal grandmother; Diabetes in her mother; Diabetes Mellitus I in her mother; Hypertension in her father and mother. Social History:  reports that she quit smoking about 7 years ago. She smoked 0.00 packs per day for 0.00 years. She has never used smokeless tobacco. She reports that she drinks about 2.4 oz of alcohol per week . She reports that she does not use drugs.     Maternal Diabetes: Yes:  Diabetes Type:  Insulin/Medication controlled Genetic Screening: Normal Maternal Ultrasounds/Referrals: Normal Fetal Ultrasounds or other Referrals:  None Maternal Substance Abuse:  No Significant Maternal Medications:  None Significant Maternal Lab Results:  None Other Comments:  None  ROS  See pertinent in HPI  History   Blood pressure (!) 138/95, pulse 96, temperature 98.2 F (36.8 C), temperature source Oral, resp. rate 18, height 5\' 11"  (1.803 m), weight 260 lb (117.9 kg), last menstrual period 11/28/2015, SpO2 99 %, currently breastfeeding. Exam Physical Exam  GENERAL: Well-developed, well-nourished  female in no acute distress.  HEENT: Normocephalic, atraumatic. Sclerae anicteric.  NECK: Supple. Normal thyroid.  LUNGS: Clear to auscultation bilaterally.  HEART: Regular rate and rhythm. BREASTS: Symmetric in size. No palpable masses or lymphadenopathy, skin changes, or nipple drainage. ABDOMEN: Soft, nontender, gravid PELVIC: Not indicated EXTREMITIES: No cyanosis, clubbing, or edema, 2+ distal pulses.  Prenatal labs: ABO, Rh: O/POS/-- (08/15 1151) Antibody: NEG (08/15 1151) Rubella: 1.32 (08/15 1151) RPR: Non Reactive (02/15 1234)  HBsAg: NEGATIVE (08/15 1151)  HIV: NONREACTIVE (11/29 1105)  GBS:   negative  Assessment/Plan: 31 yo G2P1 at 3239 weeks with CHTN and DM type 2 here for scheduled repeat cesarean section - Risks, benefits and alternatives were explained, including but not limited to risks of bleeding, infection and damage to adjacent organs. Patient verbalized understanding and all questions were answered. Patient plans on IUD for pp contraception.   Tashay Bozich 07/28/2016, 11:17 AM

## 2016-07-28 NOTE — Progress Notes (Signed)
FHR Dopplered from [id number]. FHR rate ranged from 138-142, with an acceleration to 181. Priyanka Causey Salisbury CenterBrown Bobbyjo Marulanda, [place]RN 07/28/2016 10:38 AM

## 2016-07-28 NOTE — Transfer of Care (Signed)
Immediate Anesthesia Transfer of Care Note  Patient: Joyce Green  Procedure(s) Performed: Procedure(s): CESAREAN SECTION (N/A)  Patient Location: PACU  Anesthesia Type:Spinal  Level of Consciousness: awake, alert  and oriented  Airway & Oxygen Therapy: Patient Spontanous Breathing  Post-op Assessment: Report given to RN and Post -op Vital signs reviewed and stable  Post vital signs: Reviewed and stable  Last Vitals:  Vitals:   07/28/16 1031  BP: (!) 138/95  Pulse: 96  Resp: 18  Temp: 36.8 C    Last Pain:  Vitals:   07/28/16 1034  TempSrc:   PainSc: 6          Complications: No apparent anesthesia complications

## 2016-07-28 NOTE — Op Note (Signed)
Joyce Green PROCEDURE DATE: 07/28/2016  PREOPERATIVE DIAGNOSIS: Intrauterine pregnancy at  4230w0d weeks gestation; previous cesarean section  POSTOPERATIVE DIAGNOSIS: The same  PROCEDURE:     Cesarean Section  SURGEON:  Dr. Gigi GinPeggy Shaunak Kreis  ASSISTANT: none  INDICATIONS: Joyce Green is a 31 y.o. G2P1001 at 6230w0d scheduled for cesarean section secondary to previous cesarean section.  The risks of cesarean section discussed with the patient included but were not limited to: bleeding which may require transfusion or reoperation; infection which may require antibiotics; injury to bowel, bladder, ureters or other surrounding organs; injury to the fetus; need for additional procedures including hysterectomy in the event of a life-threatening hemorrhage; placental abnormalities wth subsequent pregnancies, incisional problems, thromboembolic phenomenon and other postoperative/anesthesia complications. The patient concurred with the proposed plan, giving informed written consent for the procedure.    FINDINGS:  Viable female infant in cephalic presentation.  Apgars 7 and 8. Cord pH 6.96. Clear amniotic fluid.  Intact placenta, three vessel cord.  Normal uterus, fallopian tubes and ovaries bilaterally.  ANESTHESIA:    Spinal INTRAVENOUS FLUIDS:2700 ml ESTIMATED BLOOD LOSS: 900 ml URINE OUTPUT:  100 ml SPECIMENS: Placenta sent to pathology COMPLICATIONS: None immediate  PROCEDURE IN DETAIL:  The patient received intravenous antibiotics and had sequential compression devices applied to her lower extremities while in the preoperative area.  She was then taken to the operating room where anesthesia was induced and was found to be adequate. A foley catheter was placed into her bladder and attached to Tawny Raspberry gravity. She was then placed in a dorsal supine position with a leftward tilt, and prepped and draped in a sterile manner. After an adequate timeout was performed, a Pfannenstiel skin incision was  made with scalpel and carried through to the underlying layer of fascia. The fascia was incised in the midline and this incision was extended bilaterally using the Mayo scissors. Kocher clamps were applied to the superior aspect of the fascial incision and the underlying rectus muscles were dissected off bluntly. A similar process was carried out on the inferior aspect of the facial incision. The rectus muscles were separated in the midline bluntly and the peritoneum was entered bluntly. The Alexis self-retaining retractor was introduced into the abdominal cavity. Attention was turned to the lower uterine segment where a transverse hysterotomy was made with a scalpel and extended bilaterally bluntly. The infant was successfully delivered with the help of a vacuum and cord was clamped and cut and infant was handed over to awaiting neonatology team. Delayed cord clamping was performed for 30 sec. Uterine massage was then administered and the placenta delivered intact with three-vessel cord. The uterus was cleared of clot and debris.  The hysterotomy was closed with 0 Vicryl in a running locked fashion, and an imbricating layer was also placed with a 0 Vicryl. Overall, excellent hemostasis was noted. The pelvis copiously irrigated and cleared of all clot and debris. Hemostasis was confirmed on all surfaces.  The peritoneum and the muscles were reapproximated using 0 vicryl interrupted stitches. The fascia was then closed using 0 Vicryl in a running fashion.  The skin was closed in a subcuticular fashion using 3.0 Vicryl. The patient tolerated the procedure well. Sponge, lap, instrument and needle counts were correct x 2. She was taken to the recovery room in stable condition.    Chundra Sauerwein ConstantMD  07/28/2016 12:59 PM

## 2016-07-29 LAB — COMPREHENSIVE METABOLIC PANEL
ALBUMIN: 2.3 g/dL — AB (ref 3.5–5.0)
ALK PHOS: 88 U/L (ref 38–126)
ALT: 9 U/L — AB (ref 14–54)
AST: 19 U/L (ref 15–41)
Anion gap: 5 (ref 5–15)
BUN: 10 mg/dL (ref 6–20)
CALCIUM: 8.2 mg/dL — AB (ref 8.9–10.3)
CO2: 23 mmol/L (ref 22–32)
CREATININE: 0.53 mg/dL (ref 0.44–1.00)
Chloride: 104 mmol/L (ref 101–111)
GFR calc Af Amer: >60 mL/min (ref >60)
GFR calc non Af Amer: >60 mL/min (ref >60)
GLUCOSE: 177 mg/dL — AB (ref 65–99)
Potassium: 4 mmol/L (ref 3.5–5.1)
Sodium: 132 mmol/L — ABNORMAL LOW (ref 135–145)
Total Bilirubin: 0.4 mg/dL (ref 0.3–1.2)
Total Protein: 5.9 g/dL — ABNORMAL LOW (ref 6.5–8.1)

## 2016-07-29 LAB — GLUCOSE, CAPILLARY
Glucose-Capillary: 222 mg/dL — ABNORMAL HIGH (ref 65–99)
Glucose-Capillary: 169 mg/dL — ABNORMAL HIGH (ref 65–99)
Glucose-Capillary: 183 mg/dL — ABNORMAL HIGH (ref 65–99)

## 2016-07-29 LAB — PROTEIN / CREATININE RATIO, URINE
Creatinine, Urine: 91 mg/dL
PROTEIN CREATININE RATIO: 0.09 mg/mg{creat} (ref 0.00–0.15)
Total Protein, Urine: 8 mg/dL

## 2016-07-29 LAB — CBC
HCT: 24.4 % — ABNORMAL LOW (ref 36.0–46.0)
Hemoglobin: 8.4 g/dL — ABNORMAL LOW (ref 12.0–15.0)
MCH: 25.5 pg — ABNORMAL LOW (ref 26.0–34.0)
MCHC: 34.4 g/dL (ref 30.0–36.0)
MCV: 73.9 fL — ABNORMAL LOW (ref 78.0–100.0)
Platelets: 177 10*3/uL (ref 150–400)
RBC: 3.3 MIL/uL — ABNORMAL LOW (ref 3.87–5.11)
RDW: 14.4 % (ref 11.5–15.5)
WBC: 12.1 10*3/uL — ABNORMAL HIGH (ref 4.0–10.5)

## 2016-07-29 MED ORDER — METFORMIN HCL 500 MG PO TABS
1000.0000 mg | ORAL_TABLET | Freq: Two times a day (BID) | ORAL | Status: DC
  Administered 2016-07-29: 1000 mg via ORAL
  Filled 2016-07-29 (×3): qty 2

## 2016-07-29 MED ORDER — INSULIN ASPART 100 UNIT/ML ~~LOC~~ SOLN
3.0000 [IU] | Freq: Once | SUBCUTANEOUS | Status: AC
Start: 2016-07-29 — End: 2016-07-29
  Administered 2016-07-29: 3 [IU] via SUBCUTANEOUS

## 2016-07-29 NOTE — Progress Notes (Addendum)
Notified Dr Marcy Sirenatherine Wallace of patient's blood sugar of 222, taken 2 hours after 10 units of Lantus given (and patient said she had eaten a sandwich with no bread right after because she "was hungry." Patient has been up and ambulating; urine output is straw yellow, clear and at least 50 mls/hour (catheter still in). Patient just went up to NICU to see her baby.   Waiting for Dr Earlene PlaterWallace to assess patient's records.  0210:  REturn call from Dr Earlene PlaterWallace. New order for novalog, 3 units. jtwells

## 2016-07-29 NOTE — Addendum Note (Signed)
Addendum  created 07/29/16 [id number] by Algis GreenhouseLinda A Darcy Cordner, CRNA   Sign clinical note

## 2016-07-29 NOTE — Progress Notes (Signed)
Post Partum/POD #1 s/p RLTCS Subjective: no complaints, up ad lib, voiding and tolerating PO  Objective: Blood pressure 131/81, pulse 76, temperature 98.1 F (36.7 C), temperature source Oral, resp. rate 16, height 5\' 11"  (1.803 m), weight 117.9 kg (260 lb), last menstrual period 11/28/2015, SpO2 97 %, unknown if currently breastfeeding.  Physical Exam:  General: alert Lochia: appropriate Uterine Fundus: firm and appropriately tender at U-1 Incision: Pressure dressing removed, honeycomb dressing with dried blood present, nothing active DVT Evaluation: No evidence of DVT seen on physical exam.   Recent Labs  07/26/16 1234 07/29/16 0520  HGB 10.4* 8.4*  HCT 30.9* 24.4*    Assessment/Plan: Plan for discharge tomorrow if she desires Add back her metformin at 1000 mg BID, continue lantus as ordered   LOS: 1 day   Chalsey Leeth C Ajiah Mcglinn 07/29/2016, 7:14 AM

## 2016-07-29 NOTE — Lactation Note (Signed)
This note was copied from a baby's chart. Lactation Consultation Note; Baby in NICU. Mom has been using her own Spectra pump. Has been pumping q 2 hours. Obtaining a few drops of Colostrum. Asking about Medela pump. Has decided now she will try it. DEBP setup for mom. Reviewed setup, use and cleaning of pump pieces.  Mom pumping as I left room. No questions at present. To call for assist prn.   Patient Name: Joyce Green WNUUV'OToday's Date: 07/29/2016 Reason for consult: Follow-up assessment;NICU baby   Maternal Data Formula Feeding for Exclusion: No Has patient been taught Hand Expression?: Yes Does the patient have breastfeeding experience prior to this delivery?: No  Feeding Feeding Type: Formula Nipple Type: Slow - flow Length of feed: 25 min  LATCH Score/Interventions Latch: Repeated attempts needed to sustain latch, nipple held in mouth throughout feeding, stimulation needed to elicit sucking reflex.  Audible Swallowing: None Intervention(s): Hand expression  Type of Nipple: Everted at rest and after stimulation  Comfort (Breast/Nipple): Soft / non-tender     Hold (Positioning): Assistance needed to correctly position infant at breast and maintain latch.  LATCH Score: 6  Lactation Tools Discussed/Used     Consult Status Consult Status: Follow-up Date: 07/30/16 Follow-up type: In-patient    Pamelia HoitWeeks, Ashlee Bewley D 07/29/2016, 4:06 PM

## 2016-07-29 NOTE — Anesthesia Postprocedure Evaluation (Addendum)
Anesthesia Post Note  Patient: Joyce Green  Procedure(s) Performed: Procedure(s) (LRB): CESAREAN SECTION (N/A)  Patient location during evaluation: Mother Baby Anesthesia Type: Spinal Level of consciousness: awake Pain management: satisfactory to patient Vital Signs Assessment: post-procedure vital signs reviewed and stable Respiratory status: spontaneous breathing Cardiovascular status: stable Anesthetic complications: no        Last Vitals:  Vitals:   07/29/16 0249 07/29/16 0642  BP: 114/75 131/81  Pulse: 76 76  Resp: 16 16  Temp: 36.8 C 36.7 C    Last Pain:  Vitals:   07/29/16 0803  TempSrc:   PainSc: 5    Pain Goal:                 Cephus ShellingBURGER,LINDA

## 2016-07-30 LAB — GLUCOSE, CAPILLARY
Glucose-Capillary: 96 mg/dL (ref 65–99)
Glucose-Capillary: 111 mg/dL — ABNORMAL HIGH (ref 65–99)

## 2016-07-30 LAB — TYPE AND SCREEN
Blood Product Expiration Date: 201803172359
Blood Product Expiration Date: 201803172359
Unit Type and Rh: 5100
Unit Type and Rh: 5100

## 2016-07-30 MED ORDER — INSULIN ASPART 100 UNIT/ML ~~LOC~~ SOLN: 0.0000 [IU] | Freq: Three times a day (TID) | SUBCUTANEOUS | Status: DC

## 2016-07-30 NOTE — Progress Notes (Signed)
UR chart review completed.  

## 2016-07-30 NOTE — Plan of Care (Signed)
Problem: Nutrition: Goal: Adequate nutrition will be maintained Outcome: Progressing Discussed with patient the high sugars she was experiencing after delivery.  She had apparently  been referred by Medicaid to a facility to help her manage her type 2 diabetes but when she  found out she was pregnant, her ob office managed her through her pregnancy.  She agreed that it was an issue that she planned to explore further. Jtwells, rn

## 2016-07-30 NOTE — Lactation Note (Signed)
This note was copied from a baby's chart. Lactation Consultation Note  Patient Name: Joyce Green ORVIF'B Date: 07/30/2016 Reason for consult: Follow-up assessment;NICU baby  NICU baby 41 hours old. Mom has just finished pumping and has 6 ml of transitional breast milk at bedside. Mom given additional colostrum containers and larger bottles as well. Discussed the benefits of hospital-grade pump, and mom reports that she has a Spectra. Discussed with mom the research behind the Medela and enc mom to compare both with use. Enc mom to use pump in pumping rooms so she does not have to care pump back and forth. Enc mom to take pumping kit with her at D/C. Mom aware of pump rental details. Enc mom to collect milk from one pumping session in each container and label with date and time. Mom reports that baby has been STS. Mom stated that she did not nurse first child.   Maternal Data    Feeding Feeding Type: Formula Nipple Type: Regular Length of feed: 20 min  LATCH Score/Interventions                      Lactation Tools Discussed/Used     Consult Status Consult Status: Follow-up Date: 07/31/16 Follow-up type: In-patient    Andres Labrum 07/30/2016, 3:14 PM

## 2016-07-30 NOTE — Progress Notes (Signed)
Patient had dinner meal and went to NICU to see her baby. Blood sugar was not checked per new order. MD informed. Will start new order set at HS. Patient will be informed of new orders and sliding scale insulin.

## 2016-07-30 NOTE — Plan of Care (Signed)
Problem: Bowel/Gastric: Goal: Gastrointestinal status will improve Outcome: Completed/Met Date Met: 07/30/16 Pt now passing gas

## 2016-07-30 NOTE — Progress Notes (Signed)
Post Partum/POD #2  Subjective: Baby will be here for at least another day due to sugar control. She would like to go home tomorrow. No problems.  Objective: Blood pressure (!) 142/84, pulse (!) 108, temperature 98 F (36.7 C), temperature source Oral, resp. rate 18, height 5\' 11"  (1.803 m), weight 117.9 kg (260 lb), last menstrual period 11/28/2015, SpO2 97 %, unknown if currently breastfeeding.  Physical Exam:  General: alert Lochia: appropriate Uterine Fundus: firm and appropriately tender at U-2 Incision: dressing- c/d/i DVT Evaluation: No evidence of DVT seen on physical exam. Sugars in the middle 100's (reassurance given)  Recent Labs  07/29/16 0520  HGB 8.4*  HCT 24.4*    Assessment/Plan: Plan for discharge tomorrow   LOS: 2 days   Joyce BossierMyra C Trayvond Green 07/30/2016, 6:53 AM

## 2016-07-31 LAB — GLUCOSE, CAPILLARY
GLUCOSE-CAPILLARY: 96 mg/dL (ref 65–99)
Glucose-Capillary: 100 mg/dL — ABNORMAL HIGH (ref 65–99)

## 2016-07-31 MED ORDER — INSULIN GLARGINE 100 UNIT/ML ~~LOC~~ SOLN: 10.0000 [IU] | Freq: Every day | SUBCUTANEOUS | 11 refills | Status: DC

## 2016-07-31 MED ORDER — IBUPROFEN 600 MG PO TABS: 600.0000 mg | ORAL_TABLET | Freq: Four times a day (QID) | ORAL | 0 refills | Status: DC

## 2016-07-31 MED ORDER — METFORMIN HCL 1000 MG PO TABS: 1000.0000 mg | ORAL_TABLET | Freq: Two times a day (BID) | ORAL | 6 refills | Status: DC

## 2016-07-31 MED ORDER — OXYCODONE HCL 5 MG PO TABS: 5.0000 mg | ORAL_TABLET | ORAL | 0 refills | Status: DC | PRN

## 2016-07-31 NOTE — Discharge Summary (Signed)
Obstetric Discharge Summary Reason for Admission: Repeat cesarean section scheduled at [redacted] week EGA, GDM Prenatal Procedures: NST and ultrasound Intrapartum Procedures: cesarean: low cervical, transverse Postpartum Procedures: none Complications-Operative and Postpartum: none Hemoglobin  Date Value Ref Range Status  07/29/2016 8.4 (L) 12.0 - 15.0 g/dL Final  16/10/960403/05/2014 54.011.9 g/dL Final   HCT  Date Value Ref Range Status  07/29/2016 24.4 (L) 36.0 - 46.0 % Final  08/20/2013 36 % Final    Physical Exam:  General: alert Lochia: appropriate Uterine Fundus: firm Incision: healing well DVT Evaluation: No evidence of DVT seen on physical exam.  Discharge Diagnoses: Term Pregnancy-delivered and GDM, obesity  Discharge Information: Date: 07/31/2016 Activity: pelvic rest Diet: routine Medications: Ibuprofen, Percocet and metformin 1000mg  BID, lantus 10 units qhs Condition: stable Instructions: refer to practice specific booklet Discharge to: home Follow-up Information    Joyce BossierMyra C Roth Ress, MD. Schedule an appointment as soon as possible for a visit in 6 week(s).   Specialty:  Obstetrics and Gynecology Contact information: [address] [place] [address] [phone]           Newborn Data: Live born female  Birth Weight: 10 lb 2.3 oz (4600 g) APGAR: 4, 7  Home with baby in NICU for glucose control.  Joyce Green C Joyce Green 07/31/2016, 7:07 AM

## 2016-07-31 NOTE — Lactation Note (Signed)
This note was copied from a baby's chart. Lactation Consultation Note  Patient Name: Joyce Green Reason for consult: Follow-up assessment;NICU baby (per mom the baby's blood sugars are better and more stable ) Baby is 1869 hours old. Per mom the abby has been to the breast, has an IV, which since the blood sugar is better , plan to wean the baby off gradually.  Per mom has been pumping every 3 hours and increased amounts over night.  LC discussed with mom when she has 20 ml or greater over 3 pumping sessions she can go to maintenance mode. LC recommended taking per Medela pump pieces with her so she can use them in NICU when visiting. And when home to use her Spectra pump.  Supply and demand reviewed and recommended to pump at least every 3 hours , and when filling up it may be more often. Sore nipple and engorgement prevention and tx reviewed.  Mother informed of post-discharge support and given phone number to the lactation department, including services for phone call assistance; out-patient appointments; and breastfeeding support group. List of other breastfeeding resources in the community given in the handout. Encouraged mother to call for problems or concerns related to breastfeeding.  Maternal Data    Feeding Feeding Type: Formula Nipple Type: Regular Length of feed: 25 min  LATCH Score/Interventions                      Lactation Tools Discussed/Used Tools: Pump (with 20 plus  over night ) Breast pump type: Double-Electric Breast Pump Pump Review:  (per mom familiar )   Consult Status Consult Status: PRN Follow-up type: Other (comment) (baby in NICU )    Joyce Green Green, 9:44 AM

## 2016-07-31 NOTE — Progress Notes (Signed)
Patient said she only had two meals today and when she went to order something for dinner after coming back from the NICU she was told she couldn't order because she went over her carb count. She said she ordered a burger for lunch, but it was wrong so they reordered another one, and when dietary delivered the second one, they took back the original burger. She did not eat both of the burgers. CBG was 111 RN called and ordered her a grilled chicken salad with fruit. . RN approved for a dinner meal to be delivered even though she was over her carb count. Royston CowperIsley, Deiona Hooper E, RN

## 2016-07-31 NOTE — Discharge Instructions (Signed)
Cesarean Delivery, Care After Refer to this sheet in the next few weeks. These instructions provide you with information about caring for yourself after your procedure. Your health care provider may also give you more specific instructions. Your treatment has been planned according to current medical practices, but problems sometimes occur. Call your health care provider if you have any problems or questions after your procedure. What can I expect after the procedure? After the procedure, it is common to have:  A small amount of blood or clear fluid coming from the incision.  Some redness, swelling, and pain in your incision area.  Some abdominal pain and soreness.  Vaginal bleeding (lochia).  Pelvic cramps.  Fatigue. Follow these instructions at home: Incision care  Follow instructions from your health care provider about how to take care of your incision. Make sure you:  Wash your hands with soap and water before you change your bandage (dressing). If soap and water are not available, use hand sanitizer.  Change your dressing as told by your health care provider.  Leave stitches (sutures), skin staples, skin glue, or adhesive strips in place. These skin closures may need to stay in place for 2 weeks or longer. If adhesive strip edges start to loosen and curl up, you may trim the loose edges. Do not remove adhesive strips completely unless your health care provider tells you to do that.  Check your incision area every day for signs of infection. Check for:  More redness, swelling, or pain.  More fluid or blood.  Warmth.  Pus or a bad smell.  When you cough or sneeze, hug a pillow. This helps with pain and decreases the chance of your incision opening up (dehiscing). Do this until your incision heals. Medicines  Take over-the-counter and prescription medicines only as told by your health care provider.  If you were prescribed an antibiotic medicine, take it as told by your  health care provider. Do not stop taking the antibiotic until it is finished. Driving  Do not drive or operate heavy machinery while taking prescription pain medicine.  Do not drive for 24 hours if you received a sedative. Lifestyle  Do not drink alcohol. This is especially important if you are breastfeeding or taking pain medicine.  Do not use tobacco products, including cigarettes, chewing tobacco, or e-cigarettes. If you need help quitting, ask your health care provider. Tobacco can delay wound healing. Eating and drinking  Drink at least 8 eight-ounce glasses of water every day unless told not to by your health care provider. If you breastfeed, you may need to drink more water than this.  Eat high-fiber foods every day. These foods may help prevent or relieve constipation. High-fiber foods include:  Whole grain cereals and breads.  Brown rice.  Beans.  Fresh fruits and vegetables. Activity  Return to your normal activities as told by your health care provider. Ask your health care provider what activities are safe for you.  Rest as much as possible. Try to rest or take a nap while your baby is sleeping.  Do not lift anything that is heavier than your baby or 10 lb (4.5 kg) as told by your health care provider.  Ask your health care provider when you can engage in sexual activity. This may depend on your:  Risk of infection.  Healing rate.  Comfort and desire to engage in sexual activity. Bathing  Do not take baths, swim, or use a hot tub until your health care provider approves.  Ask your health care provider if you can take showers. You may only be allowed to take sponge baths until your incision heals.  Keep your dressing dry as told by your health care provider. General instructions  Do not use tampons or douches until your health care provider approves.  Wear:  Loose, comfortable clothing.  A supportive and well-fitting bra.  Watch for any blood clots that  may pass from your vagina. These may look like clumps of dark red, brown, or black discharge.  Keep your perineum clean and dry as told by your health care provider.  Wipe from front to back when you use the toilet.  If possible, have someone help you care for your baby and help with household activities for a few days after you leave the hospital.  Keep all follow-up visits for you and your baby as told by your health care provider. This is important. Contact a health care provider if:  You have:  Bad-smelling vaginal discharge.  Difficulty urinating.  Pain when urinating.  A sudden increase or decrease in the frequency of your bowel movements.  More redness, swelling, or pain around your incision.  More fluid or blood coming from your incision.  Pus or a bad smell coming from your incision.  A fever.  A rash.  Little or no interest in activities you used to enjoy.  Questions about caring for yourself or your baby.  Nausea.  Your incision feels warm to the touch.  Your breasts turn red or become painful or hard.  You feel unusually sad or worried.  You vomit.  You pass large blood clots from your vagina. If you pass a blood clot, save it to show to your health care provider. Do not flush blood clots down the toilet without showing your health care provider.  You urinate more than usual.  You are dizzy or light-headed.  You have not breastfed and have not had a menstrual period for 12 weeks after delivery.  You stopped breastfeeding and have not had a menstrual period for 12 weeks after stopping breastfeeding. Get help right away if:  You have:  Pain that does not go away or get better with medicine.  Chest pain.  Difficulty breathing.  Blurred vision or spots in your vision.  Thoughts about hurting yourself or your baby.  New pain in your abdomen or in one of your legs.  A severe headache.  You faint.  You bleed from your vagina so much that  you fill two sanitary pads in one hour. This information is not intended to replace advice given to you by your health care provider. Make sure you discuss any questions you have with your health care provider. Document Released: 02/17/2002 Document Revised: 10/06/2015 Document Reviewed: 05/02/2015 Elsevier Interactive Patient Education  2017 Elsevier Inc. Iron-Rich Diet Introduction Iron is a mineral that helps your body to produce hemoglobin. Hemoglobin is a protein in your red blood cells that carries oxygen to your body's tissues. Eating too little iron may cause you to feel weak and tired, and it can increase your risk for infection. Eating enough iron is necessary for your body's metabolism, muscle function, and nervous system. Iron is naturally found in many foods. It can also be added to foods or fortified in foods. There are two types of dietary iron:  Heme iron. Heme iron is absorbed by the body more easily than nonheme iron. Heme iron is found in meat, poultry, and fish.  Nonheme iron. Nonheme  iron is found in dietary supplements, iron-fortified grains, beans, and vegetables. You may need to follow an iron-rich diet if:  You have been diagnosed with iron deficiency or iron-deficiency anemia.  You have a condition that prevents you from absorbing dietary iron, such as:  Infection in your intestines.  Celiac disease. This involves long-lasting (chronic) inflammation of your intestines.  You do not eat enough iron.  You eat a diet that is high in foods that impair iron absorption.  You have lost a lot of blood.  You have heavy bleeding during your menstrual cycle.  You are pregnant. What is my plan? Your health care provider may help you to determine how much iron you need per day based on your condition. Generally, when a person consumes sufficient amounts of iron in the diet, the following iron needs are met:  Men.  47-57 years old: 11 mg per day.  61-23 years old: 8  mg per day.  Women.  31-46 years old: 15 mg per day.  58-69 years old: 18 mg per day.  Over 95 years old: 8 mg per day.  Pregnant women: 27 mg per day.  Breastfeeding women: 9 mg per day. What do I need to know about an iron-rich diet?  Eat fresh fruits and vegetables that are high in vitamin C along with foods that are high in iron. This will help increase the amount of iron that your body absorbs from food, especially with foods containing nonheme iron. Foods that are high in vitamin C include oranges, peppers, tomatoes, and mango.  Take iron supplements only as directed by your health care provider. Overdose of iron can be life-threatening. If you were prescribed iron supplements, take them with orange juice or a vitamin C supplement.  Cook foods in pots and pans that are made from iron.  Eat nonheme iron-containing foods alongside foods that are high in heme iron. This helps to improve your iron absorption.  Certain foods and drinks contain compounds that impair iron absorption. Avoid eating these foods in the same meal as iron-rich foods or with iron supplements. These include:  Coffee, black tea, and red wine.  Milk, dairy products, and foods that are high in calcium.  Beans, soybeans, and peas.  Whole grains.  When eating foods that contain both nonheme iron and compounds that impair iron absorption, follow these tips to absorb iron better.  Soak beans overnight before cooking.  Soak whole grains overnight and drain them before using.  Ferment flours before baking, such as using yeast in bread dough. What foods can I eat? Grains  Iron-fortified breakfast cereal. Iron-fortified whole-wheat bread. Enriched rice. Sprouted grains. Vegetables  Spinach. Potatoes with skin. Green peas. Broccoli. Red and green bell peppers. Fermented vegetables. Fruits  Prunes. Raisins. Oranges. Strawberries. Mango. Grapefruit. Meats and Other Protein Sources  Beef liver. Oysters. Beef.  Shrimp. [place]. Chicken. [place]. Sardines. Chickpeas. Nuts. Tofu. Beverages  Tomato juice. Fresh orange juice. Prune juice. Hibiscus tea. Fortified instant breakfast shakes. Condiments  Tahini. Fermented soy sauce. Sweets and Desserts  Black-strap molasses. Other  Wheat germ. The items listed above may not be a complete list of recommended foods or beverages. Contact your dietitian for more options.  What foods are not recommended? Grains  Whole grains. Bran cereal. Bran flour. Oats. Vegetables  Artichokes. Brussels sprouts. Kale. Fruits  Blueberries. Raspberries. Strawberries. Figs. Meats and Other Protein Sources  Soybeans. Products made from soy protein. Dairy  Milk. Cream. Cheese. Yogurt. Cottage cheese. Beverages  Coffee.  Black tea. Red wine. Sweets and Desserts  Cocoa. Chocolate. Ice cream. Other  Basil. Oregano. Parsley. The items listed above may not be a complete list of foods and beverages to avoid. Contact your dietitian for more information.  This information is not intended to replace advice given to you by your health care provider. Make sure you discuss any questions you have with your health care provider. Document Released: 01/09/2005 Document Revised: 12/16/2015 Document Reviewed: 12/23/2013  2017 Elsevier

## 2016-07-31 NOTE — Progress Notes (Signed)
   Lab Results  Component Value Date   GLUCAP 96 07/31/2016   HGBA1C 5.9 (H) 05/09/2016    Review of Glycemic Control Results for Joyce Green, Joyce Green (MRN [id number]) as of 07/31/2016 09:30  Ref. Range 07/29/2016 06:31 07/29/2016 21:54 07/30/2016 19:48 07/31/2016 00:09 07/31/2016 09:09  Glucose-Capillary Latest Ref Range: 65 - 99 mg/dL 119169 (H) 147183 (H) 829111 (H) 100 (H) 96   Inpatient Diabetes Program Recommendations:  Received consult. Spoke to MD regarding patient may not need Lantus @ discharge. MD states he plans to take Lantus off of discharge medication list.  Thank you, Billy FischerJudy E. Vincie Linn, RN, MSN, CDE Inpatient Glycemic Control Team Team Pager [phone] (8am-5pm) 07/31/2016 9:32 AM

## 2016-09-21 ENCOUNTER — Other Ambulatory Visit (HOSPITAL_COMMUNITY)
Admission: RE | Admit: 2016-09-21 | Discharge: 2016-09-21 | Disposition: A | Discharge status: Home / Self Care | Payer: Medicaid Other | Source: Ambulatory Visit | Attending: Obstetrics and Gynecology | Admitting: Obstetrics and Gynecology

## 2016-09-21 ENCOUNTER — Ambulatory Visit (INDEPENDENT_AMBULATORY_CARE_PROVIDER_SITE_OTHER): Payer: Medicaid Other | Admitting: Obstetrics and Gynecology

## 2016-09-21 ENCOUNTER — Encounter: Payer: Self-pay | Admitting: Obstetrics and Gynecology

## 2016-09-21 DIAGNOSIS — Z1151 Encounter for screening for human papillomavirus (HPV): Secondary | ICD-10-CM | POA: Diagnosis not present

## 2016-09-21 DIAGNOSIS — E1165 Type 2 diabetes mellitus with hyperglycemia: Secondary | ICD-10-CM | POA: Insufficient documentation

## 2016-09-21 DIAGNOSIS — E1159 Type 2 diabetes mellitus with other circulatory complications: Secondary | ICD-10-CM | POA: Insufficient documentation

## 2016-09-21 DIAGNOSIS — E119 Type 2 diabetes mellitus without complications: Secondary | ICD-10-CM | POA: Insufficient documentation

## 2016-09-21 DIAGNOSIS — Z124 Encounter for screening for malignant neoplasm of cervix: Secondary | ICD-10-CM

## 2016-09-21 DIAGNOSIS — I10 Essential (primary) hypertension: Secondary | ICD-10-CM | POA: Insufficient documentation

## 2016-09-21 DIAGNOSIS — E118 Type 2 diabetes mellitus with unspecified complications: Secondary | ICD-10-CM

## 2016-09-21 MED ORDER — ENALAPRIL MALEATE 5 MG PO TABS: 5.0000 mg | ORAL_TABLET | Freq: Every day | ORAL | 3 refills | Status: DC

## 2016-09-21 NOTE — Progress Notes (Signed)
Obstetrics Visit Postpartum Visit  Appointment Date: 09/21/2016  OBGYN Clinic: [facility]  Primary Care Provider: No PCP Per Patient  Chief Complaint:  Chief Complaint  Patient presents with  . Postpartum Check    History of Present Illness: Joyce Green is a 31 y.o. African-American [id number] (Patient's last menstrual period was 09/04/2016.), seen for the above chief complaint. Her past medical history is significant for DM2, cHTN   She is s/p rpt c-section on 2/17 by Dr. Jolayne Panther; she was discharged to home on POD#3  Vaginal bleeding or discharge: No  Breast or formula feeding: pumping occasionally Intercourse: Yes, one week ago (no protection)  Contraception after delivery: No  PP depression s/s: No  Any bowel or bladder issues: No  Pap smear: no abnormalities (date: 09/2013)  Review of Systems:  as noted in the History of Present Illness.  Medications Metformin 1000 bid  Allergies Patient has no known allergies.  Physical Exam:  BP (!) 151/99 (BP Location: Left Arm, Patient Position: Sitting, Cuff Size: Normal)   Pulse 97   Wt 240 lb (108.9 kg)   LMP 09/04/2016   Breastfeeding? Yes   BMI 33.47 kg/m  Body mass index is 33.47 kg/m. General appearance: Well nourished, well developed female in no acute distress.  Cardiovascular: normal s1 and s2.  No murmurs, rubs or gallops. Respiratory:  Clear to auscultation bilateral. Normal respiratory effort Abdomen: positive bowel sounds and no masses, hernias; diffusely non tender to palpation, non distended. Well healed low transverse incision Neuro/Psych:  Normal mood and affect.  Skin:  Warm and dry.  Lymphatic:  No inguinal lymphadenopathy.   Pelvic exam: is not limited by body habitus EGBUS: within normal limits Vagina: within normal limits and with no blood in the vault, Cervix:  no lesions or cervical motion tenderness Uterus:  nonenlarged and approximately 8-10 week sized Adnexa:  normal  adnexa and no mass, fullness, tenderness Rectovaginal: deferred  Laboratory: UPT negative. pap obtained  Assessment: pt doing well  Plan:  Normal exam. f/u pap smear. Pt wold like Liletta. Recommend f/u in at least 1wk, abstain and rpt UPT and go ahead with Liletta, which she is amenable to. Pt states she missed her medicaid PCP appt b/c she had another appt. D/w her to call them and establish care. She states her AM fastings are in the 70s and post prandials in the 140s. I recommend that she start an ace inhibitor which she is amenable to . Will start enalapril  po qday and pt amenable to making another PCP appt  RTC 1-2wks  Cornelia Copa MD Attending [facility])

## 2016-09-24 LAB — CYTOLOGY - PAP
Diagnosis: NEGATIVE
HPV: NOT DETECTED

## 2016-10-03 ENCOUNTER — Ambulatory Visit: Payer: Medicaid Other | Admitting: Obstetrics & Gynecology

## 2016-10-29 ENCOUNTER — Ambulatory Visit: Payer: Medicaid Other | Admitting: Obstetrics and Gynecology

## 2016-12-24 NOTE — Anesthesia Postprocedure Evaluation (Deleted)
Anesthesia Post Note  Patient: Joyce Green  Procedure(s) Performed: Procedure(s) (LRB): CESAREAN SECTION (N/A)     Anesthesia Post Evaluation  Last Vitals:  Vitals:   07/30/16 1824 07/31/16 0548  BP: (!) 154/91 (!) 142/83  Pulse: 97 89  Resp: 18 18  Temp: 36.7 C 36.8 C    Last Pain:  Vitals:   07/31/16 1522  TempSrc:   PainSc: 7                  Kasee Hantz,E. Laraina Sulton

## 2016-12-24 NOTE — Addendum Note (Signed)
Addendum  created 12/24/16 1537 by Jairo BenJackson, Niccolas Loeper, MD   Sign clinical note

## 2016-12-24 NOTE — Addendum Note (Signed)
Addendum  created 12/24/16 1626 by Jairo BenJackson, Early Ord, MD   Delete clinical note, Sign clinical note

## 2017-04-08 ENCOUNTER — Emergency Department: Payer: Medicaid Other

## 2017-04-08 ENCOUNTER — Emergency Department
Admission: EM | Admit: 2017-04-08 | Discharge: 2017-04-08 | Disposition: A | Discharge status: Home / Self Care | Payer: Medicaid Other | Attending: Student in an Organized Health Care Education/Training Program | Admitting: Student in an Organized Health Care Education/Training Program

## 2017-04-08 ENCOUNTER — Encounter: Payer: Self-pay | Admitting: Emergency Medicine

## 2017-04-08 DIAGNOSIS — Z7984 Long term (current) use of oral hypoglycemic drugs: Secondary | ICD-10-CM | POA: Diagnosis not present

## 2017-04-08 DIAGNOSIS — E119 Type 2 diabetes mellitus without complications: Secondary | ICD-10-CM | POA: Diagnosis not present

## 2017-04-08 DIAGNOSIS — I10 Essential (primary) hypertension: Secondary | ICD-10-CM | POA: Diagnosis not present

## 2017-04-08 DIAGNOSIS — Z87891 Personal history of nicotine dependence: Secondary | ICD-10-CM | POA: Insufficient documentation

## 2017-04-08 DIAGNOSIS — R079 Chest pain, unspecified: Secondary | ICD-10-CM | POA: Diagnosis present

## 2017-04-08 DIAGNOSIS — R0789 Other chest pain: Secondary | ICD-10-CM | POA: Diagnosis not present

## 2017-04-08 DIAGNOSIS — Z79899 Other long term (current) drug therapy: Secondary | ICD-10-CM | POA: Insufficient documentation

## 2017-04-08 LAB — CBC
HCT: 37.1 % (ref 35.0–47.0)
Hemoglobin: 11.9 g/dL — ABNORMAL LOW (ref 12.0–16.0)
MCH: 23.1 pg — ABNORMAL LOW (ref 26.0–34.0)
MCHC: 31.9 g/dL — ABNORMAL LOW (ref 32.0–36.0)
MCV: 72.4 fL — AB (ref 80.0–100.0)
PLATELETS: 236 10*3/uL (ref 150–440)
RBC: 5.13 MIL/uL (ref 3.80–5.20)
RDW: 18.2 % — AB (ref 11.5–14.5)
WBC: 6.2 10*3/uL (ref 3.6–11.0)

## 2017-04-08 LAB — BASIC METABOLIC PANEL
Anion gap: 8 (ref 5–15)
BUN: 11 mg/dL (ref 6–20)
CALCIUM: 8.9 mg/dL (ref 8.9–10.3)
CHLORIDE: 102 mmol/L (ref 101–111)
CO2: 24 mmol/L (ref 22–32)
CREATININE: 0.69 mg/dL (ref 0.44–1.00)
Glucose, Bld: 189 mg/dL — ABNORMAL HIGH (ref 65–99)
Potassium: 3.7 mmol/L (ref 3.5–5.1)
SODIUM: 134 mmol/L — AB (ref 135–145)

## 2017-04-08 LAB — TROPONIN I

## 2017-04-08 MED ORDER — LORAZEPAM 0.5 MG PO TABS: 0.5000 mg | ORAL_TABLET | Freq: Three times a day (TID) | ORAL | 0 refills | Status: DC | PRN

## 2017-04-08 NOTE — Care Management Note (Signed)
Case Management Note  Patient Details  Name: Joyce Green MRN: [id number] Date of Birth: [birth year]  Subjective/Objective:     Spoke to patient at bedside. She admits that she is not regularly checking her blood sugars , but says the one she has is not currently working. She is on Lantus and Metformin, but when asked about who prescribes them she is very hedging. She is very amenable, and agreeable to calling her MD that does the prescribing of her meds to get either a new meter (because they gave her one before), or getting a script so she can get a new one on her insurance. The MD has been made aware.               Action/Plan:   Expected Discharge Date:                  Expected Discharge Plan:     In-House Referral:     Discharge planning Services     Post Acute Care Choice:    Choice offered to:     DME Arranged:    DME Agency:     HH Arranged:    HH Agency:     Status of Service:     If discussed at [employer] Length of Stay Meetings, dates discussed:    Additional Comments:  Berna BueCheryl Aniyia Rane, RN 04/08/2017, 12:09 PM

## 2017-04-08 NOTE — ED Provider Notes (Signed)
[facility] Emergency Department Provider Note    First MD Initiated Contact with Patient 04/08/17 1130     (approximate)  I have reviewed the triage vital signs and the nursing notes.   HISTORY  Chief Complaint Chest Pain    HPI Joyce Green is a 31 y.o. female is from home with 1 week of sharp, brief, electricity and burning pain in her chest with tingling in her left arm.  States it happens several times a day but is only seconds in nature.  States that she has been more stressed out recently and just started noticing these chest pains about a week ago.  Does not remember any inciting events.  No recent illness.  No shortness of breath.  No lower extremity swelling.  No orthopnea.  No family history of sudden cardiac death.  She does not smoke.  Is not on birth control.  Denies any pleuritic chest pain.  No current numbness or tingling.  She is currently chest pain-free.  Past Medical History:  Diagnosis Date  . Diabetes mellitus without complication (HCC)   . Hypertension   . Palpitations    Family History  Problem Relation Age of Onset  . Diabetes Mellitus I Mother   . Hypertension Mother   . Diabetes Mother   . Hypertension Father   . Cancer Maternal Grandmother   . Cancer Maternal Grandfather   . Cancer Paternal Grandmother   . Cancer Paternal Grandfather   . Early death Neg Hx   . Heart disease Neg Hx   . Hyperlipidemia Neg Hx   . Kidney disease Neg Hx   . Stroke Neg Hx    Past Surgical History:  Procedure Laterality Date  . CESAREAN SECTION N/A 02/20/2014   Procedure: CESAREAN SECTION;  Surgeon: Lazaro ArmsLuther H Eure, MD;  Location: [facility] ORS;  Service: Obstetrics;  Laterality: N/A;  . CESAREAN SECTION N/A 07/28/2016   Procedure: CESAREAN SECTION;  Surgeon: Catalina AntiguaPeggy Constant, MD;  Location: [facility] BIRTHING SU[facility];  Service: Obstetrics;  Laterality: N/A;   Patient Active Problem List   Diagnosis Date Noted  . DM (diabetes mellitus), type 2 (HCC)  09/21/2016  . Benign essential HTN 09/21/2016  . Obesity (BMI 30-39.9) 01/08/2013      Prior to Admission medications   Medication Sig Start Date End Date Taking? Authorizing Provider  enalapril (VASOTEC) 5 MG tablet Take 1 tablet (5 mg total) by mouth daily. 09/21/16   [place] BingPickens, Charlie, MD  ibuprofen (ADVIL,MOTRIN) 600 MG tablet Take 1 tablet (600 mg total) by mouth every 6 (six) hours. Patient not taking: Reported on 09/21/2016 07/31/16   Allie Bossierove, Myra C, MD  metFORMIN (GLUCOPHAGE) 1000 MG tablet Take 1 tablet (1,000 mg total) by mouth 2 (two) times daily with a meal. 07/31/16   Allie Bossierove, Myra C, MD  Prenatal Vit-Fe Fumarate-FA (PRENATAL COMPLETE PO) Take 1 tablet by mouth daily.    [provider]    Allergies Patient has no known allergies.    Social History Social History  Substance Use Topics  . Smoking status: Former Smoker    Packs/day: 0.00    Years: 0.00    Quit date: 06/11/2009  . Smokeless tobacco: Never Used     Comment: not while pregnant  . Alcohol use 2.4 oz/week    2 Glasses of wine, 2 Cans of beer per week     Comment: occasional; stopped after found out she was pregnant    Review of Systems Patient denies headaches, rhinorrhea, blurry  vision, numbness, shortness of breath, chest pain, edema, cough, abdominal pain, nausea, vomiting, diarrhea, dysuria, fevers, rashes or hallucinations unless otherwise stated above in HPI. ____________________________________________   PHYSICAL EXAM:  VITAL SIGNS: Vitals:   04/08/17 1000  BP: (!) 170/98  Pulse: 99  Resp: 20  Temp: 98.1 F (36.7 C)  SpO2: 98%    Constitutional: Alert and oriented. Well appearing and in no acute distress. Eyes: Conjunctivae are normal.  Head: Atraumatic. Nose: No congestion/rhinnorhea. Mouth/Throat: Mucous membranes are moist.   Neck: No stridor. Painless ROM.  Cardiovascular: Normal rate, regular rhythm. Grossly normal heart sounds.  Good peripheral circulation. Respiratory:  Normal respiratory effort.  No retractions. Lungs CTAB. Gastrointestinal: Soft and nontender. No distention. No abdominal bruits. No CVA tenderness. Genitourinary: deferred Musculoskeletal: No lower extremity tenderness nor edema.  No joint effusions. Neurologic:  Normal speech and language. No gross focal neurologic deficits are appreciated. No facial droop Skin:  Skin is warm, dry and intact. No rash noted. Psychiatric: anxious appearing, intermittently tearful but with appropriate thought process, no SI or HI  ____________________________________________   LABS (all labs ordered are listed, but only abnormal results are displayed)  Results for orders placed or performed during the hospital encounter of 04/08/17 (from the past 24 hour(s))  Basic metabolic panel     Status: Abnormal   Collection Time: 04/08/17 10:05 AM  Result Value Ref Range   Sodium 134 (L) 135 - 145 mmol/L   Potassium 3.7 3.5 - 5.1 mmol/L   Chloride 102 101 - 111 mmol/L   CO2 24 22 - 32 mmol/L   Glucose, Bld 189 (H) 65 - 99 mg/dL   BUN 11 6 - 20 mg/dL   Creatinine, Ser 4.09 0.44 - 1.00 mg/dL   Calcium 8.9 8.9 - 81.1 mg/dL   GFR calc non Af Amer >60 >60 mL/min   GFR calc Af Amer >60 >60 mL/min   Anion gap 8 5 - 15  CBC     Status: Abnormal   Collection Time: 04/08/17 10:05 AM  Result Value Ref Range   WBC 6.2 3.6 - 11.0 K/uL   RBC 5.13 3.80 - 5.20 MIL/uL   Hemoglobin 11.9 (L) 12.0 - 16.0 g/dL   HCT 91.4 78.2 - 95.6 %   MCV 72.4 (L) 80.0 - 100.0 fL   MCH 23.1 (L) 26.0 - 34.0 pg   MCHC 31.9 (L) 32.0 - 36.0 g/dL   RDW 21.3 (H) 08.6 - 57.8 %   Platelets 236 150 - 440 K/uL  Troponin I     Status: None   Collection Time: 04/08/17 10:05 AM  Result Value Ref Range   Troponin I <0.03 <0.03 ng/mL   ____________________________________________  EKG My review and personal interpretation at Time: 9:54   Indication: chest pain  Rate: 95  Rhythm: sinus Axis: normal Other: normal intervals, no stemi, no pr  depressions ____________________________________________  RADIOLOGY  I personally reviewed all radiographic images ordered to evaluate for the above acute complaints and reviewed radiology reports and findings.  These findings were personally discussed with the patient.  Please see medical record for radiology report.  ____________________________________________   PROCEDURES  Procedure(s) performed:  Procedures    Critical Care performed: no ____________________________________________   INITIAL IMPRESSION / ASSESSMENT AND PLAN / ED COURSE  Pertinent labs & imaging results that were available during my care of the patient were reviewed by me and considered in my medical decision making (see chart for details).  DDX: ACS, pericarditis, esophagitis, boerhaaves,  pe, dissection, pna, bronchitis, costochondritis   Nada Godley is a 31 y.o. who presents to the ED with chest discomfort as described above.  Patient is afebrile and hemodynamically stable.  She is low risk by Wells criteria and is PERC negative.  Patient is low risk by heart score of 1 with normal troponin here in the ER after 1 week of intermittent symptoms.  There is no evidence of pericarditis.  This is not clinically consistent with dissection.  Abdominal exam is soft and benign.  Blood work is otherwise reassuring.  Do suspect some component of muscular skeletal pain.  Possible palpitation as she does describe brief sudden onset and rapid resolution of pains.  Will have patient follow-up with cardiology for possible long-term cardiac monitoring.  At this point I do believe the patient is stable for follow-up with PCP.     ____________________________________________   FINAL CLINICAL IMPRESSION(S) / ED DIAGNOSES  Final diagnoses:  Chest pain, unspecified type      NEW MEDICATIONS STARTED DURING THIS VISIT:  New Prescriptions   No medications on file     Note:  This document was prepared using Dragon  voice recognition software and may include unintentional dictation errors.    Willy Eddy, MD 04/08/17 1158

## 2017-04-08 NOTE — ED Notes (Signed)
Care management at bedside.

## 2017-04-08 NOTE — ED Triage Notes (Signed)
Pt with left sided chest pain started about one week ago, radiates down into left arm. Pt tearful in triage.

## 2017-04-21 ENCOUNTER — Encounter: Payer: Self-pay | Admitting: *Deleted

## 2017-04-21 ENCOUNTER — Other Ambulatory Visit: Payer: Self-pay

## 2017-04-21 ENCOUNTER — Emergency Department
Admission: EM | Admit: 2017-04-21 | Discharge: 2017-04-21 | Disposition: A | Discharge status: Home / Self Care | Payer: Medicaid Other | Attending: Emergency Medicine | Admitting: Emergency Medicine

## 2017-04-21 DIAGNOSIS — Y939 Activity, unspecified: Secondary | ICD-10-CM | POA: Diagnosis not present

## 2017-04-21 DIAGNOSIS — S0501XA Injury of conjunctiva and corneal abrasion without foreign body, right eye, initial encounter: Secondary | ICD-10-CM | POA: Insufficient documentation

## 2017-04-21 DIAGNOSIS — S0591XA Unspecified injury of right eye and orbit, initial encounter: Secondary | ICD-10-CM | POA: Diagnosis present

## 2017-04-21 DIAGNOSIS — Z79899 Other long term (current) drug therapy: Secondary | ICD-10-CM | POA: Diagnosis not present

## 2017-04-21 DIAGNOSIS — I10 Essential (primary) hypertension: Secondary | ICD-10-CM | POA: Insufficient documentation

## 2017-04-21 DIAGNOSIS — Z87891 Personal history of nicotine dependence: Secondary | ICD-10-CM | POA: Diagnosis not present

## 2017-04-21 DIAGNOSIS — Y999 Unspecified external cause status: Secondary | ICD-10-CM | POA: Insufficient documentation

## 2017-04-21 DIAGNOSIS — Z7984 Long term (current) use of oral hypoglycemic drugs: Secondary | ICD-10-CM | POA: Diagnosis not present

## 2017-04-21 DIAGNOSIS — E119 Type 2 diabetes mellitus without complications: Secondary | ICD-10-CM | POA: Insufficient documentation

## 2017-04-21 DIAGNOSIS — X58XXXA Exposure to other specified factors, initial encounter: Secondary | ICD-10-CM | POA: Insufficient documentation

## 2017-04-21 DIAGNOSIS — Y929 Unspecified place or not applicable: Secondary | ICD-10-CM | POA: Insufficient documentation

## 2017-04-21 MED ORDER — TETRACAINE HCL 0.5 % OP SOLN
2.0000 [drp] | Freq: Once | OPHTHALMIC | Status: AC
  Administered 2017-04-21: 2 [drp] via OPHTHALMIC
  Filled 2017-04-21: qty 8

## 2017-04-21 MED ORDER — TRAMADOL HCL 50 MG PO TABS: 50.0000 mg | ORAL_TABLET | Freq: Four times a day (QID) | ORAL | 0 refills | Status: DC | PRN

## 2017-04-21 MED ORDER — ERYTHROMYCIN 5 MG/GM OP OINT: TOPICAL_OINTMENT | Freq: Three times a day (TID) | OPHTHALMIC | 0 refills | Status: DC

## 2017-04-21 MED ORDER — ERYTHROMYCIN 5 MG/GM OP OINT: TOPICAL_OINTMENT | Freq: Three times a day (TID) | OPHTHALMIC | 0 refills | Status: AC

## 2017-04-21 MED ORDER — OXYCODONE-ACETAMINOPHEN 5-325 MG PO TABS
1.0000 | ORAL_TABLET | Freq: Once | ORAL | Status: AC
  Administered 2017-04-21: 1 via ORAL
  Filled 2017-04-21: qty 1

## 2017-04-21 MED ORDER — MELOXICAM 7.5 MG PO TABS: 7.5000 mg | ORAL_TABLET | Freq: Two times a day (BID) | ORAL | 0 refills | Status: DC | PRN

## 2017-04-21 MED ORDER — FLUORESCEIN SODIUM 1 MG OP STRP
1.0000 | ORAL_STRIP | Freq: Once | OPHTHALMIC | Status: AC
  Administered 2017-04-21: 1 via OPHTHALMIC
  Filled 2017-04-21: qty 1

## 2017-04-21 NOTE — ED Triage Notes (Signed)
Patient c/o light-sensitivity and pain in right eye after wearing contacts.

## 2017-04-21 NOTE — ED Provider Notes (Signed)
[facility] Emergency Department Provider Note  ____________________________________________  Time seen: Approximately 7:04 PM  I have reviewed the triage vital signs and the nursing notes.   HISTORY  Chief Complaint Eye Pain    HPI Joyce Green is a 31 y.o. female that presents to the emergency department with red eye pain for 1 day. She wears contacts and does not remember the last time that she changed them. She is sensitive to light. She saw the optomitrist [facility] this afternoon and was given cipro drops and gel lubricant.  She was told that she has a corneal abrasion.  Symptoms are not improving. She has another appointment scheduled with her optometrist tomorrow. She had a cold last week. No fever, headache, visual changes, floaters, flashes, nausea, vomiting.    Past Medical History:  Diagnosis Date  . Diabetes mellitus without complication (HCC)   . Hypertension   . Palpitations     Patient Active Problem List   Diagnosis Date Noted  . DM (diabetes mellitus), type 2 (HCC) 09/21/2016  . Benign essential HTN 09/21/2016  . Obesity (BMI 30-39.9) 01/08/2013    History reviewed. No pertinent surgical history.  Prior to Admission medications   Medication Sig Start Date End Date Taking? Authorizing Provider  enalapril (VASOTEC) 5 MG tablet Take 1 tablet (5 mg total) by mouth daily. 09/21/16   Gardner BingPickens, Charlie, MD  erythromycin [facility](ROMYCIN) ophthalmic ointment Place 3 (three) times daily for 10 days into the right eye. Place a 1/2 inch ribbon of ointment into the lower eyelid. 04/21/17 05/01/17  Enid DerryWagner, Shailee Foots, PA-C  ibuprofen (ADVIL,MOTRIN) 600 MG tablet Take 1 tablet (600 mg total) by mouth every 6 (six) hours. Patient not taking: Reported on 09/21/2016 07/31/16   Allie Bossierove, Myra C, MD  LORazepam (ATIVAN) 0.5 MG tablet Take 1 tablet (0.5 mg total) by mouth every 8 (eight) hours as needed for anxiety. 04/08/17 04/08/18  Willy Eddyobinson, Patrick, MD  meloxicam  (MOBIC) 7.5 MG tablet Take 1 tablet (7.5 mg total) 2 (two) times daily as needed by mouth for pain. 04/21/17 04/21/18  Enid DerryWagner, Rowyn Mustapha, PA-C  metFORMIN (GLUCOPHAGE) 1000 MG tablet Take 1 tablet (1,000 mg total) by mouth 2 (two) times daily with a meal. 07/31/16   Allie Bossierove, Myra C, MD  Prenatal Vit-Fe Fumarate-FA (PRENATAL COMPLETE PO) Take 1 tablet by mouth daily.    [provider]    Allergies Patient has no known allergies.  Family History  Problem Relation Age of Onset  . Diabetes Mellitus I Mother   . Hypertension Mother   . Diabetes Mother   . Hypertension Father   . Cancer Maternal Grandmother   . Cancer Maternal Grandfather   . Cancer Paternal Grandmother   . Cancer Paternal Grandfather   . Early death Neg Hx   . Heart disease Neg Hx   . Hyperlipidemia Neg Hx   . Kidney disease Neg Hx   . Stroke Neg Hx     Social History Social History   Tobacco Use  . Smoking status: Former Smoker    Packs/day: 0.00    Years: 0.00    Pack years: 0.00    Last attempt to quit: 06/11/2009    Years since quitting: 7.8  . Smokeless tobacco: Never Used  . Tobacco comment: not while pregnant  Substance Use Topics  . Alcohol use: Yes    Alcohol/week: 2.4 oz    Types: 2 Glasses of wine, 2 Cans of beer per week    Comment: occasional;  stopped after found out she was pregnant  . Drug use: No    Comment: per prenatal record, +THC on initial visit u/a; no current use     Review of Systems  Constitutional: No fever/chills Cardiovascular: No chest pain. Respiratory: No SOB. Gastrointestinal: No abdominal pain.  No nausea, no vomiting.  Skin: Negative for rash, abrasions, lacerations, ecchymosis.   ____________________________________________   PHYSICAL EXAM:  VITAL SIGNS: ED Triage Vitals  Enc Vitals Group     BP 04/21/17 1856 (!) 155/100     Pulse Rate 04/21/17 1844 97     Resp 04/21/17 1844 20     Temp 04/21/17 1844 98.6 F (37 C)     Temp Source 04/21/17 1844 Oral      SpO2 04/21/17 1844 100 %     Weight 04/21/17 1845 230 lb (104.3 kg)     Height 04/21/17 1845 5\' 10"  (1.778 m)     Head Circumference --      Peak Flow --      Pain Score 04/21/17 1841 10     Pain Loc --      Pain Edu? --      Excl. in GC? --      Constitutional: Alert and oriented. Well appearing and in no acute distress. Eyes: Conjunctivae is mildly injected in the right eye. PERRL. EOMI. Clear discharge from eye. Head: Atraumatic. ENT:      Ears:      Nose: No congestion/rhinnorhea.      Mouth/Throat: Mucous membranes are moist.  Neck: No stridor.  Cardiovascular: Normal rate, regular rhythm.  Good peripheral circulation. Respiratory: Normal respiratory effort without tachypnea or retractions. Lungs CTAB. Good air entry to the bases with no decreased or absent breath sounds. Musculoskeletal: Full range of motion to all extremities. No gross deformities appreciated. Neurologic:  Normal speech and language. No gross focal neurologic deficits are appreciated.  Skin:  Skin is warm, dry and intact. No rash noted.   ____________________________________________   LABS (all labs ordered are listed, but only abnormal results are displayed)  Labs Reviewed - No data to display ____________________________________________  EKG   ____________________________________________  RADIOLOGY  No results found.  ____________________________________________    PROCEDURES  Procedure(s) performed:    Procedures    Medications  fluorescein ophthalmic strip 1 strip (not administered)  tetracaine (PONTOCAINE) 0.5 % ophthalmic solution 2 drop (not administered)  oxyCODONE-acetaminophen (PERCOCET/ROXICET) 5-325 MG per tablet 1 tablet (not administered)     ____________________________________________   INITIAL IMPRESSION / ASSESSMENT AND PLAN / ED COURSE  Pertinent labs & imaging results that were available during my care of the patient were reviewed by me and considered in  my medical decision making (see chart for details).  Review of the [place] CSRS was performed in accordance of the [facility] prior to dispensing any controlled drugs.   Patient presented to the emergency department for evaluation of right eye pain for 1 day.  Patient saw her optometrist this afternoon and has a follow-up appointment tomorrow.  She was told that she has a corneal abrasion and was given a prescription for Cipro eyedrops and for lubricating gel.  She came to the emergency department because her eye is still painful.  Pain resolved with 1 drop of tetracaine.  She was given a dose of oxycodone.  Patient will be discharged home with prescriptions for meloxicam and erythromycin. Patient is to follow up with optometry as directed. Patient is given ED precautions to return to the ED  for any worsening or new symptoms.     ____________________________________________  FINAL CLINICAL IMPRESSION(S) / ED DIAGNOSES  Final diagnoses:  Abrasion of right cornea, initial encounter      NEW MEDICATIONS STARTED DURING THIS VISIT:  This SmartLink is deprecated. Use AVSMEDLIST instead to display the medication list for a patient.      This chart was dictated using voice recognition software/Dragon. Despite best efforts to proofread, errors can occur which can change the meaning. Any change was purely unintentional.    Enid Derry, PA-C 04/21/17 2303    Sharyn Creamer, MD 04/22/17 [phone]

## 2017-04-21 NOTE — ED Notes (Signed)
Pt presents today with cornea pain seen eye doctor today. Pt received antibiotic drops and lubrication drops. PT is here for pain those drops don't help pain. Pt states eye doctor told her it was an abrasion and inflammation.

## 2017-05-24 DIAGNOSIS — H5213 Myopia, bilateral: Secondary | ICD-10-CM | POA: Diagnosis not present

## 2017-06-11 NOTE — L&D Delivery Note (Signed)
OB/GYN Faculty Practice Delivery Note  Joyce Green is a 32 y.o. [id number] s/p SVD of an IUFD [redacted]w[redacted]d She was admitted for induction of labor for IUFD.   ROM: 8h 162mith clear fluid, copious GBS Status: negative Maximum Maternal Temperature: Temp (24hrs), Avg:98.1 F (36.7 C), Min:97.6 F (36.4 C), Max:98.9 F (37.2 C)  Labor Progress: . Induction started 03/20/18 with laminaria . Several rounds of cytotec . Pitocin started, epidural placed . AROM copious clear fluids   Delivery Date/Time: 03/22/18 at 1843 Delivery: Called to room and patient was complete and pushing. Head delivered OA. Restituted to right transverse. Shoulder and body delivered with good maternal effort. Infant  placed on mother's abdomen and dried. Cord clamped x 2, and cut by father of infant. Placenta delivered spontaneously with gentle cord traction. Fundus firm with massage and Pitocin. Labia, perineum, vagina, and cervix inspected inspected with 2nd degree perineal laceration noted. Some oozing noted after repair complete. Sweep of lower uterine segment with small clot removed, less oozing noted afterwards.   Placenta: spontaneous, intact, 3-vessel cord  edematous but no other gross abnormalities noted  sent to pathology Complications: IUFD, copious fluid with AROM Lacerations: 2nd degree perineal repaired with 3-0 Vicryl in usual fashion  EBL: 314cc Pain Control: epidural  Postpartum Planning [x] message to sent to schedule follow-up  [x] vaccines UTD # Type II Diabetes - Ordered metformin 100057mID  Lantus 8U qHS  SSI ACHS # cHTN - Ordered enalapril 35m35mily to start postpartum   Infant: IUFD, parents wish to hold infant at this time  weight pending and requested by family  family has met with chaplain and is planning to select a funeral home  discussed options for further evaluation into etiology of IUFD including Anora and autopsy but family declined at this time. -- no gross abnormalities  noted on exam, skin desquamation noted -- low risk NIPS -- mild narrowing of ductus arteriosus diagnosed antepartum had resolved on last fetal ECHO 02/17/18   Marshea Wisher S. WallJuleen China OB/GYN Fellow, Faculty Practice

## 2017-11-06 ENCOUNTER — Other Ambulatory Visit (HOSPITAL_COMMUNITY)
Admission: RE | Admit: 2017-11-06 | Discharge: 2017-11-06 | Disposition: A | Discharge status: Home / Self Care | Payer: 59 | Source: Ambulatory Visit | Attending: Family Medicine | Admitting: Family Medicine

## 2017-11-06 ENCOUNTER — Encounter: Payer: Self-pay | Admitting: Family Medicine

## 2017-11-06 ENCOUNTER — Ambulatory Visit (INDEPENDENT_AMBULATORY_CARE_PROVIDER_SITE_OTHER): Payer: 59 | Admitting: Family Medicine

## 2017-11-06 VITALS — BP 162/115 | HR 102 | Wt 230.0 lb

## 2017-11-06 DIAGNOSIS — O119 Pre-existing hypertension with pre-eclampsia, unspecified trimester: Secondary | ICD-10-CM | POA: Insufficient documentation

## 2017-11-06 DIAGNOSIS — O10911 Unspecified pre-existing hypertension complicating pregnancy, first trimester: Secondary | ICD-10-CM

## 2017-11-06 DIAGNOSIS — O0990 Supervision of high risk pregnancy, unspecified, unspecified trimester: Secondary | ICD-10-CM | POA: Diagnosis not present

## 2017-11-06 DIAGNOSIS — O24911 Unspecified diabetes mellitus in pregnancy, first trimester: Secondary | ICD-10-CM

## 2017-11-06 DIAGNOSIS — O34219 Maternal care for unspecified type scar from previous cesarean delivery: Secondary | ICD-10-CM

## 2017-11-06 DIAGNOSIS — O10919 Unspecified pre-existing hypertension complicating pregnancy, unspecified trimester: Secondary | ICD-10-CM

## 2017-11-06 DIAGNOSIS — Z3A00 Weeks of gestation of pregnancy not specified: Secondary | ICD-10-CM | POA: Insufficient documentation

## 2017-11-06 DIAGNOSIS — Z01419 Encounter for gynecological examination (general) (routine) without abnormal findings: Secondary | ICD-10-CM | POA: Diagnosis not present

## 2017-11-06 DIAGNOSIS — O99211 Obesity complicating pregnancy, first trimester: Secondary | ICD-10-CM

## 2017-11-06 DIAGNOSIS — Z01411 Encounter for gynecological examination (general) (routine) with abnormal findings: Secondary | ICD-10-CM

## 2017-11-06 DIAGNOSIS — E118 Type 2 diabetes mellitus with unspecified complications: Secondary | ICD-10-CM

## 2017-11-06 DIAGNOSIS — O99210 Obesity complicating pregnancy, unspecified trimester: Secondary | ICD-10-CM

## 2017-11-06 DIAGNOSIS — I10 Essential (primary) hypertension: Secondary | ICD-10-CM | POA: Diagnosis not present

## 2017-11-06 DIAGNOSIS — O0991 Supervision of high risk pregnancy, unspecified, first trimester: Secondary | ICD-10-CM

## 2017-11-06 DIAGNOSIS — O24919 Unspecified diabetes mellitus in pregnancy, unspecified trimester: Secondary | ICD-10-CM | POA: Insufficient documentation

## 2017-11-06 DIAGNOSIS — E669 Obesity, unspecified: Secondary | ICD-10-CM

## 2017-11-06 HISTORY — DX: Pre-existing hypertension with pre-eclampsia, unspecified trimester: O11.9

## 2017-11-06 MED ORDER — METFORMIN HCL 1000 MG PO TABS: 1000.0000 mg | ORAL_TABLET | Freq: Two times a day (BID) | ORAL | 11 refills | Status: DC

## 2017-11-06 MED ORDER — LABETALOL HCL 200 MG PO TABS: 200.0000 mg | ORAL_TABLET | Freq: Two times a day (BID) | ORAL | 3 refills | Status: DC

## 2017-11-06 MED ORDER — INSULIN GLARGINE 100 UNIT/ML SOLOSTAR PEN: 12.0000 [IU] | PEN_INJECTOR | Freq: Every day | SUBCUTANEOUS | 11 refills | Status: DC

## 2017-11-06 NOTE — Progress Notes (Signed)
   INITIAL PRENATAL VISIT NOTE YEARLY WELLNESS EXAM  Subjective:  Joyce Green is a 32 y.o. G3P2002 at [redacted]w[redacted]d being seen today for ongoing prenatal care.  She is currently monitored for the following issues for this high-risk pregnancy and has Obesity (BMI 30-39.9); DM (diabetes mellitus), type 2 (HCC); Benign essential HTN; Supervision of high risk pregnancy, antepartum; and Previous cesarean delivery affecting pregnancy on their problem list.  Patient reports no complaints.  Contractions: Not present. Vag. Bleeding: None.  Movement: Absent. Denies leaking of fluid.   The following portions of the patient's history were reviewed and updated as appropriate: allergies, current medications, past family history, past medical history, past social history, past surgical history and problem list. Problem list updated.  Objective:   Vitals:   11/06/17 1324  BP: (!) 162/115  Pulse: (!) 102  Weight: 230 lb (104.3 kg)    Fetal Status:     Movement: Absent     General:  Alert, oriented and cooperative. Patient is in no acute distress.  Skin: Skin is warm and dry. No rash noted.   Cardiovascular: Normal heart rate noted  Respiratory: Normal respiratory effort, no problems with respiration noted  Abdomen: Soft, gravid, appropriate for gestational age.  Pain/Pressure: Absent     Pelvic: Cervical exam deferred        Extremities: Normal range of motion.  Edema: None  Mental Status: Normal mood and affect. Normal behavior. Normal judgment and thought content.   Assessment and Plan:  Pregnancy: G3P2002 at [redacted]w[redacted]d  1. Supervision of high risk pregnancy, antepartum -Reviewed group practice model and involvement of learners in the hospital - Obstetric Panel, Including HIV - Culture, OB Urine - GC/Chlamydia probe amp ([place])not at [facility] - Genetic Screening - SMN1 COPY NUMBER ANALYSIS (SMA Carrier Screen) - Cystic Fibrosis Mutation 97 - CBC - TSH - Comprehensive metabolic panel - Lipid  panel  2. Benign essential HTN - BP elevated today-- was taking enalapril - Protein / creatinine ratio, urine - CMP today -Start ASA in 2 weeks  3. Type 2 diabetes mellitus with complication, unspecified whether long term insulin use (HCC) - Last infant weighed 10#2oz - Hemoglobin A1c  4. Previous cesarean delivery affecting pregnancy CSx2  5. Encounter for gynecological examination with abnormal finding Seen today for yearly wellness exam and is currently pregnant  Patient is UTD on preventative health measures such as pap She is eligible for PNA vaccination due to her diabetes. This is not a live virus and does not need to be deferred until the end of pregnancy but there is limited data. Generally recommended to avoid in the first trimester and women inadvertently vaccinated in pregnancy did not have any adverse fetal outcomes  (per RefillAlert.uy.html#ppsv23)    Health Maintenance Due  Topic Date Due  . PNEUMOCOCCAL POLYSACCHARIDE VACCINE (1) 06/10/1988  . OPHTHALMOLOGY EXAM  08/13/2013  . FOOT EXAM  01/07/2014  . HEMOGLOBIN A1C  11/06/2016   Preterm labor symptoms and general obstetric precautions including but not limited to vaginal bleeding, contractions, leaking of fluid and fetal movement were reviewed in detail with the patient. Please refer to After Visit Summary for other counseling recommendations.  No follow-ups on file.  Future Appointments  Date Time Provider Department Center  11/22/2017 11:30 AM Emi Belfast, FNP [facility]    Federico Flake, MD

## 2017-11-07 LAB — OBSTETRIC PANEL, INCLUDING HIV
Antibody Screen: NEGATIVE
BASOS ABS: 0 10*3/uL (ref 0.0–0.2)
Basos: 0 %
EOS (ABSOLUTE): 0.1 10*3/uL (ref 0.0–0.4)
Eos: 1 %
HIV SCREEN 4TH GENERATION: NONREACTIVE
Hematocrit: 34.7 % (ref 34.0–46.6)
Hemoglobin: 11.4 g/dL (ref 11.1–15.9)
Hepatitis B Surface Ag: NEGATIVE
Immature Grans (Abs): 0 10*3/uL (ref 0.0–0.1)
Immature Granulocytes: 0 %
LYMPHS ABS: 1.1 10*3/uL (ref 0.7–3.1)
Lymphs: 19 %
MCH: 24.7 pg — AB (ref 26.6–33.0)
MCHC: 32.9 g/dL (ref 31.5–35.7)
MCV: 75 fL — AB (ref 79–97)
MONOS ABS: 0.3 10*3/uL (ref 0.1–0.9)
Monocytes: 5 %
NEUTROS ABS: 4.5 10*3/uL (ref 1.4–7.0)
Neutrophils: 75 %
PLATELETS: 213 10*3/uL (ref 150–450)
RBC: 4.61 x10E6/uL (ref 3.77–5.28)
RDW: 18 % — ABNORMAL HIGH (ref 12.3–15.4)
RPR Ser Ql: NONREACTIVE
Rh Factor: POSITIVE
Rubella Antibodies, IGG: 9.16 index (ref >0.99)
WBC: 6.1 10*3/uL (ref 3.4–10.8)

## 2017-11-07 LAB — LIPID PANEL
CHOL/HDL RATIO: 3.2 ratio (ref 0.0–4.4)
CHOLESTEROL TOTAL: 156 mg/dL (ref 100–199)
HDL: 49 mg/dL (ref >39)
LDL CALC: 86 mg/dL (ref 0–99)
Triglycerides: 104 mg/dL (ref 0–149)
VLDL CHOLESTEROL CAL: 21 mg/dL (ref 5–40)

## 2017-11-07 LAB — HEMOGLOBIN A1C
Est. average glucose Bld gHb Est-mCnc: 232 mg/dL
Hgb A1c MFr Bld: 9.7 % — ABNORMAL HIGH (ref 4.8–5.6)

## 2017-11-07 LAB — COMPREHENSIVE METABOLIC PANEL
ALBUMIN: 4 g/dL (ref 3.5–5.5)
ALK PHOS: 54 IU/L (ref 39–117)
ALT: 7 IU/L (ref 0–32)
AST: 12 IU/L (ref 0–40)
Albumin/Globulin Ratio: 1.4 (ref 1.2–2.2)
BUN / CREAT RATIO: 14 (ref 9–23)
BUN: 10 mg/dL (ref 6–20)
Bilirubin Total: <0.2 mg/dL (ref 0.0–1.2)
CO2: 18 mmol/L — ABNORMAL LOW (ref 20–29)
CREATININE: 0.7 mg/dL (ref 0.57–1.00)
Calcium: 9.1 mg/dL (ref 8.7–10.2)
Chloride: 104 mmol/L (ref 96–106)
GFR calc Af Amer: 134 mL/min/{1.73_m2} (ref >59)
GFR calc non Af Amer: 116 mL/min/{1.73_m2} (ref >59)
GLOBULIN, TOTAL: 2.9 g/dL (ref 1.5–4.5)
GLUCOSE: 110 mg/dL — AB (ref 65–99)
Potassium: 3.9 mmol/L (ref 3.5–5.2)
Sodium: 137 mmol/L (ref 134–144)
Total Protein: 6.9 g/dL (ref 6.0–8.5)

## 2017-11-07 LAB — PROTEIN / CREATININE RATIO, URINE
Creatinine, Urine: 166 mg/dL
PROTEIN UR: 36.1 mg/dL
Protein/Creat Ratio: 217 mg/g creat — ABNORMAL HIGH (ref 0–200)

## 2017-11-07 LAB — TSH: TSH: 1.63 u[IU]/mL (ref 0.450–4.500)

## 2017-11-08 LAB — GC/CHLAMYDIA PROBE AMP (~~LOC~~) NOT AT ARMC
Chlamydia: NEGATIVE
Neisseria Gonorrhea: NEGATIVE

## 2017-11-09 LAB — CULTURE, OB URINE

## 2017-11-09 LAB — URINE CULTURE, OB REFLEX

## 2017-11-14 ENCOUNTER — Encounter: Payer: Self-pay | Admitting: Radiology

## 2017-11-14 ENCOUNTER — Telehealth: Payer: Self-pay | Admitting: Radiology

## 2017-11-14 NOTE — Telephone Encounter (Signed)
Left message on the cell phone to let patient know that her results from genetic testing are back.

## 2017-11-18 LAB — SMN1 COPY NUMBER ANALYSIS (SMA CARRIER SCREENING)

## 2017-11-18 LAB — CYSTIC FIBROSIS MUTATION 97: GENE DIS ANAL CARRIER INTERP BLD/T-IMP: NOT DETECTED

## 2017-11-22 ENCOUNTER — Ambulatory Visit: Payer: Medicaid Other | Admitting: Family Medicine

## 2017-12-04 ENCOUNTER — Ambulatory Visit (INDEPENDENT_AMBULATORY_CARE_PROVIDER_SITE_OTHER): Payer: 59 | Admitting: Family Medicine

## 2017-12-04 ENCOUNTER — Encounter: Payer: Self-pay | Admitting: Family Medicine

## 2017-12-04 ENCOUNTER — Encounter (HOSPITAL_COMMUNITY): Payer: Self-pay

## 2017-12-04 VITALS — BP 142/92 | HR 97 | Wt 237.8 lb

## 2017-12-04 DIAGNOSIS — O0992 Supervision of high risk pregnancy, unspecified, second trimester: Secondary | ICD-10-CM

## 2017-12-04 DIAGNOSIS — O24919 Unspecified diabetes mellitus in pregnancy, unspecified trimester: Secondary | ICD-10-CM

## 2017-12-04 DIAGNOSIS — E669 Obesity, unspecified: Secondary | ICD-10-CM | POA: Diagnosis not present

## 2017-12-04 DIAGNOSIS — O34219 Maternal care for unspecified type scar from previous cesarean delivery: Secondary | ICD-10-CM

## 2017-12-04 DIAGNOSIS — O0990 Supervision of high risk pregnancy, unspecified, unspecified trimester: Secondary | ICD-10-CM

## 2017-12-04 DIAGNOSIS — O10912 Unspecified pre-existing hypertension complicating pregnancy, second trimester: Secondary | ICD-10-CM | POA: Diagnosis not present

## 2017-12-04 DIAGNOSIS — O10919 Unspecified pre-existing hypertension complicating pregnancy, unspecified trimester: Secondary | ICD-10-CM

## 2017-12-04 DIAGNOSIS — O24912 Unspecified diabetes mellitus in pregnancy, second trimester: Secondary | ICD-10-CM

## 2017-12-04 DIAGNOSIS — O99212 Obesity complicating pregnancy, second trimester: Secondary | ICD-10-CM | POA: Diagnosis not present

## 2017-12-04 DIAGNOSIS — O99210 Obesity complicating pregnancy, unspecified trimester: Secondary | ICD-10-CM

## 2017-12-04 NOTE — Progress Notes (Signed)
   PRENATAL VISIT NOTE  Subjective:  Joyce Green is a 32 y.o. G3P2002 at 9066w0d being seen today for ongoing prenatal care.  She is currently monitored for the following issues for this high-risk pregnancy and has Obesity (BMI 30-39.9); DM (diabetes mellitus), type 2 (HCC); Benign essential HTN; Supervision of high risk pregnancy, antepartum; Previous cesarean delivery affecting pregnancy; Chronic hypertension during pregnancy, antepartum; Diabetes mellitus complicating pregnancy, antepartum; and Obesity affecting pregnancy, antepartum on their problem list.  Patient reports nausea.  Contractions: Not present. Vag. Bleeding: None.  Movement: Present. Denies leaking of fluid.   The following portions of the patient's history were reviewed and updated as appropriate: allergies, current medications, past family history, past medical history, past social history, past surgical history and problem list. Problem list updated.  Objective:   Vitals:   12/04/17 1325  BP: (!) 142/92  Pulse: 97  Weight: 237 lb 12.8 oz (107.9 kg)   Body mass index is 34.12 kg/m.  Fetal Status: Fetal Heart Rate (bpm): 142 Fundal Height: 18 cm Movement: Present     General:  Alert, oriented and cooperative. Patient is in no acute distress.  Skin: Skin is warm and dry. No rash noted.   Cardiovascular: Normal heart rate noted  Respiratory: Normal respiratory effort, no problems with respiration noted  Abdomen: Soft, gravid, appropriate for gestational age.  Pain/Pressure: Absent     Pelvic: Cervical exam deferred        Extremities: Normal range of motion.  Edema: None  Mental Status: Normal mood and affect. Normal behavior. Normal judgment and thought content.   Assessment and Plan:  Pregnancy: G3P2002 at 6366w0d  1. Chronic hypertension during pregnancy, antepartum -Patient reports SE of dizziness with AM dose - Decrease Labetalol -- 100mg  AM, 200mg  PM - Continue ASA  2. Diabetes mellitus complicating  pregnancy, antepartum - Reports taking Lantus and Metformin - Sugars are under control per patient report - Plan for regular growth US q4wk  3. Supervision of high risk pregnancy, antepartum UTD currently  4. Previous cesarean delivery affecting pregnancy Planning on repeat CS  5. Obesity affecting pregnancy, antepartum Total weight gain = 7 lb 12.8 oz (3.538 kg) which is at goal. Total goal is 11-15 lbs.   Preterm labor symptoms and general obstetric precautions including but not limited to vaginal bleeding, contractions, leaking of fluid and fetal movement were reviewed in detail with the patient. Please refer to After Visit Summary for other counseling recommendations.  Return in about 2 weeks (around 12/18/2017) for Routine prenatal care.  Future Appointments  Date Time Provider Department Center  12/11/2017  2:00 PM [facility] US 3 [facility]  01/01/2018  1:30 PM Raelyn Moraawson, Rolitta, CNM [facility]    Federico FlakeKimberly Niles Lavan Imes, MD

## 2017-12-11 ENCOUNTER — Encounter (HOSPITAL_COMMUNITY): Payer: Self-pay

## 2017-12-11 ENCOUNTER — Other Ambulatory Visit: Payer: Self-pay | Admitting: Family Medicine

## 2017-12-11 ENCOUNTER — Inpatient Hospital Stay (HOSPITAL_COMMUNITY)
Admission: RE | Admit: 2017-12-11 | Discharge: 2017-12-11 | Disposition: A | Discharge status: Home / Self Care | Payer: 59 | Source: Ambulatory Visit | Attending: Family Medicine | Admitting: Family Medicine

## 2017-12-11 DIAGNOSIS — Z363 Encounter for antenatal screening for malformations: Secondary | ICD-10-CM

## 2017-12-11 DIAGNOSIS — O24112 Pre-existing diabetes mellitus, type 2, in pregnancy, second trimester: Secondary | ICD-10-CM

## 2017-12-11 DIAGNOSIS — E669 Obesity, unspecified: Secondary | ICD-10-CM | POA: Diagnosis not present

## 2017-12-11 DIAGNOSIS — Z3A19 19 weeks gestation of pregnancy: Secondary | ICD-10-CM

## 2017-12-11 DIAGNOSIS — O10912 Unspecified pre-existing hypertension complicating pregnancy, second trimester: Secondary | ICD-10-CM | POA: Insufficient documentation

## 2017-12-11 DIAGNOSIS — O99210 Obesity complicating pregnancy, unspecified trimester: Secondary | ICD-10-CM

## 2017-12-11 DIAGNOSIS — O10919 Unspecified pre-existing hypertension complicating pregnancy, unspecified trimester: Secondary | ICD-10-CM

## 2017-12-11 DIAGNOSIS — O34219 Maternal care for unspecified type scar from previous cesarean delivery: Secondary | ICD-10-CM

## 2017-12-11 DIAGNOSIS — O99213 Obesity complicating pregnancy, third trimester: Secondary | ICD-10-CM

## 2017-12-11 DIAGNOSIS — O24912 Unspecified diabetes mellitus in pregnancy, second trimester: Secondary | ICD-10-CM | POA: Diagnosis not present

## 2017-12-11 DIAGNOSIS — O99212 Obesity complicating pregnancy, second trimester: Secondary | ICD-10-CM | POA: Diagnosis not present

## 2017-12-11 DIAGNOSIS — Z794 Long term (current) use of insulin: Secondary | ICD-10-CM | POA: Diagnosis not present

## 2017-12-11 DIAGNOSIS — O24919 Unspecified diabetes mellitus in pregnancy, unspecified trimester: Secondary | ICD-10-CM

## 2017-12-11 DIAGNOSIS — O0990 Supervision of high risk pregnancy, unspecified, unspecified trimester: Secondary | ICD-10-CM

## 2017-12-17 ENCOUNTER — Other Ambulatory Visit (HOSPITAL_COMMUNITY): Payer: Self-pay | Admitting: *Deleted

## 2017-12-17 DIAGNOSIS — O24119 Pre-existing diabetes mellitus, type 2, in pregnancy, unspecified trimester: Secondary | ICD-10-CM

## 2018-01-01 ENCOUNTER — Encounter: Payer: 59 | Admitting: Obstetrics and Gynecology

## 2018-01-08 ENCOUNTER — Encounter (HOSPITAL_COMMUNITY): Payer: Self-pay

## 2018-01-08 ENCOUNTER — Ambulatory Visit (HOSPITAL_COMMUNITY)
Admission: RE | Admit: 2018-01-08 | Discharge: 2018-01-08 | Disposition: A | Discharge status: Home / Self Care | Payer: 59 | Source: Ambulatory Visit

## 2018-01-21 ENCOUNTER — Other Ambulatory Visit: Payer: Self-pay

## 2018-01-21 ENCOUNTER — Encounter (HOSPITAL_COMMUNITY): Payer: Self-pay

## 2018-01-21 DIAGNOSIS — E118 Type 2 diabetes mellitus with unspecified complications: Secondary | ICD-10-CM

## 2018-01-21 DIAGNOSIS — Z3A25 25 weeks gestation of pregnancy: Secondary | ICD-10-CM | POA: Diagnosis not present

## 2018-01-21 DIAGNOSIS — O358XX0 Maternal care for other (suspected) fetal abnormality and damage, not applicable or unspecified: Secondary | ICD-10-CM | POA: Diagnosis not present

## 2018-01-21 DIAGNOSIS — O10919 Unspecified pre-existing hypertension complicating pregnancy, unspecified trimester: Secondary | ICD-10-CM

## 2018-01-21 DIAGNOSIS — O24119 Pre-existing diabetes mellitus, type 2, in pregnancy, unspecified trimester: Secondary | ICD-10-CM | POA: Diagnosis not present

## 2018-01-21 MED ORDER — LABETALOL HCL 200 MG PO TABS
200.0000 mg | ORAL_TABLET | Freq: Two times a day (BID) | ORAL | 3 refills | Status: DC
Start: 2018-01-21 — End: 2018-01-22

## 2018-01-21 MED ORDER — METFORMIN HCL 1000 MG PO TABS: 1000.0000 mg | ORAL_TABLET | Freq: Two times a day (BID) | ORAL | 11 refills | Status: DC

## 2018-01-22 ENCOUNTER — Ambulatory Visit (INDEPENDENT_AMBULATORY_CARE_PROVIDER_SITE_OTHER): Payer: Medicaid Other | Admitting: Family Medicine

## 2018-01-22 ENCOUNTER — Encounter: Payer: Self-pay | Admitting: Family Medicine

## 2018-01-22 VITALS — BP 132/87 | HR 101 | Wt 237.4 lb

## 2018-01-22 DIAGNOSIS — O99213 Obesity complicating pregnancy, third trimester: Secondary | ICD-10-CM | POA: Diagnosis not present

## 2018-01-22 DIAGNOSIS — O24913 Unspecified diabetes mellitus in pregnancy, third trimester: Secondary | ICD-10-CM

## 2018-01-22 DIAGNOSIS — M542 Cervicalgia: Secondary | ICD-10-CM

## 2018-01-22 DIAGNOSIS — O0993 Supervision of high risk pregnancy, unspecified, third trimester: Secondary | ICD-10-CM

## 2018-01-22 DIAGNOSIS — E118 Type 2 diabetes mellitus with unspecified complications: Secondary | ICD-10-CM

## 2018-01-22 DIAGNOSIS — O10913 Unspecified pre-existing hypertension complicating pregnancy, third trimester: Secondary | ICD-10-CM

## 2018-01-22 DIAGNOSIS — O24919 Unspecified diabetes mellitus in pregnancy, unspecified trimester: Secondary | ICD-10-CM

## 2018-01-22 DIAGNOSIS — O99210 Obesity complicating pregnancy, unspecified trimester: Secondary | ICD-10-CM

## 2018-01-22 DIAGNOSIS — O34219 Maternal care for unspecified type scar from previous cesarean delivery: Secondary | ICD-10-CM

## 2018-01-22 DIAGNOSIS — O0990 Supervision of high risk pregnancy, unspecified, unspecified trimester: Secondary | ICD-10-CM

## 2018-01-22 DIAGNOSIS — O10919 Unspecified pre-existing hypertension complicating pregnancy, unspecified trimester: Secondary | ICD-10-CM

## 2018-01-22 MED ORDER — GLUCOSE BLOOD VI STRP
ORAL_STRIP | 12 refills | Status: DC
Start: 2018-01-22 — End: 2019-12-30

## 2018-01-22 MED ORDER — ACCU-CHEK NANO SMARTVIEW W/DEVICE KIT: 1.0000 | PACK | 0 refills | Status: AC

## 2018-01-22 MED ORDER — INSULIN GLARGINE 100 UNIT/ML SOLOSTAR PEN: 13.0000 [IU] | PEN_INJECTOR | Freq: Every day | SUBCUTANEOUS | 11 refills | Status: DC

## 2018-01-22 MED ORDER — CYCLOBENZAPRINE HCL 10 MG PO TABS: 10.0000 mg | ORAL_TABLET | Freq: Three times a day (TID) | ORAL | 1 refills | Status: DC | PRN

## 2018-01-22 MED ORDER — LABETALOL HCL 200 MG PO TABS: 200.0000 mg | ORAL_TABLET | Freq: Two times a day (BID) | ORAL | 3 refills | Status: DC

## 2018-01-22 MED ORDER — ACCU-CHEK FASTCLIX LANCETS MISC: 1.0000 [IU] | Freq: Four times a day (QID) | 12 refills | Status: DC

## 2018-01-22 NOTE — Progress Notes (Signed)
   PRENATAL VISIT NOTE  Subjective:  Joyce Green is a 32 y.o. G3P2002 at 5275w0d being seen today for ongoing prenatal care.  She is currently monitored for the following issues for this high-risk pregnancy and has Obesity (BMI 30-39.9); DM (diabetes mellitus), type 2 (HCC); Benign essential HTN; Supervision of high risk pregnancy, antepartum; Previous cesarean delivery affecting pregnancy; Chronic hypertension during pregnancy, antepartum; Diabetes mellitus complicating pregnancy, antepartum; and Obesity affecting pregnancy, antepartum on their problem list.  Patient reports no complaints.  Contractions: Not present. Vag. Bleeding: None.  Movement: Present. Denies leaking of fluid.   The following portions of the patient's history were reviewed and updated as appropriate: allergies, current medications, past family history, past medical history, past social history, past surgical history and problem list. Problem list updated.  Objective:   Vitals:   01/22/18 1338  BP: 132/87  Pulse: (!) 101  Weight: 237 lb 6.4 oz (107.7 kg)    Fetal Status: Fetal Heart Rate (bpm): 152   Movement: Present     General:  Alert, oriented and cooperative. Patient is in no acute distress.  Skin: Skin is warm and dry. No rash noted.   Cardiovascular: Normal heart rate noted  Respiratory: Normal respiratory effort, no problems with respiration noted  Abdomen: Soft, gravid, appropriate for gestational age.  Pain/Pressure: Absent     Pelvic: Cervical exam deferred        Extremities: Normal range of motion.  Edema: None  Mental Status: Normal mood and affect. Normal behavior. Normal judgment and thought content.   Assessment and Plan:  Pregnancy: G3P2002 at 4675w0d  1. Supervision of high risk pregnancy, antepartum UTD currently  2. Chronic hypertension during pregnancy, antepartum BP stable today and well controlled Continue Labetalol 100mg  in AM, 200mg  PM  3. Diabetes mellitus complicating  pregnancy, antepartum Poorly controlled T2DM, initial HA1C= 9.7% in May 2019 Fasting 95-100s- per patient typically in the higher end of this range After eating  130s- 170 Ordered a replacement meter for patient to assist in improved compliance Using 12units at night--> Increase to 13 units qhs based on report of fastings mostly over 100 [facility] AM for repeat HA1C Had Fetal Echo- wnl per patient MFM appointment next week for EFW  4. Obesity affecting pregnancy, antepartum Total weight gain thus far = 7 lb 6.4 oz (3.357 kg) and it currently at goal  5. Previous cesarean delivery affecting pregnancy Will have scheduled repeat CS at 39wk  Preterm labor symptoms and general obstetric precautions including but not limited to vaginal bleeding, contractions, leaking of fluid and fetal movement were reviewed in detail with the patient. Please refer to After Visit Summary for other counseling recommendations.  Return in about 4 weeks (around 02/19/2018) for Routine prenatal care.  Future Appointments  Date Time Provider Department Center  01/29/2018 12:45 PM [facility] US 2 [facility]    Federico FlakeKimberly Niles Newton, MD

## 2018-01-23 MED ORDER — ACCU-CHEK AVIVA PLUS W/DEVICE KIT: 1.0000 | PACK | Freq: Once | 0 refills | Status: AC

## 2018-01-23 MED ORDER — GLUCOSE BLOOD VI STRP
ORAL_STRIP | 12 refills | Status: DC
Start: 2018-01-23 — End: 2018-03-23

## 2018-01-23 MED ORDER — ACCU-CHEK FASTCLIX LANCETS MISC: 4 refills | Status: DC

## 2018-01-23 NOTE — Addendum Note (Signed)
Addended by: Cheree DittoGRAHAM, Rustin Erhart A on: 01/23/2018 02:11 PM   Modules accepted: Orders

## 2018-01-29 ENCOUNTER — Other Ambulatory Visit (HOSPITAL_COMMUNITY): Payer: Self-pay | Admitting: Obstetrics and Gynecology

## 2018-01-29 ENCOUNTER — Encounter (HOSPITAL_COMMUNITY): Payer: Self-pay

## 2018-01-29 ENCOUNTER — Ambulatory Visit (HOSPITAL_COMMUNITY)
Admission: RE | Admit: 2018-01-29 | Discharge: 2018-01-29 | Disposition: A | Discharge status: Home / Self Care | Payer: 59 | Source: Ambulatory Visit | Attending: Obstetrics & Gynecology | Admitting: Obstetrics & Gynecology

## 2018-01-29 ENCOUNTER — Other Ambulatory Visit (HOSPITAL_COMMUNITY): Payer: Self-pay | Admitting: *Deleted

## 2018-01-29 DIAGNOSIS — Z3A26 26 weeks gestation of pregnancy: Secondary | ICD-10-CM | POA: Diagnosis not present

## 2018-01-29 DIAGNOSIS — Z362 Encounter for other antenatal screening follow-up: Secondary | ICD-10-CM | POA: Diagnosis not present

## 2018-01-29 DIAGNOSIS — O24112 Pre-existing diabetes mellitus, type 2, in pregnancy, second trimester: Secondary | ICD-10-CM | POA: Diagnosis not present

## 2018-01-29 DIAGNOSIS — I10 Essential (primary) hypertension: Secondary | ICD-10-CM

## 2018-01-29 DIAGNOSIS — O10919 Unspecified pre-existing hypertension complicating pregnancy, unspecified trimester: Secondary | ICD-10-CM

## 2018-01-29 DIAGNOSIS — O99212 Obesity complicating pregnancy, second trimester: Secondary | ICD-10-CM

## 2018-01-29 DIAGNOSIS — O10012 Pre-existing essential hypertension complicating pregnancy, second trimester: Secondary | ICD-10-CM | POA: Diagnosis not present

## 2018-01-29 DIAGNOSIS — O162 Unspecified maternal hypertension, second trimester: Secondary | ICD-10-CM | POA: Insufficient documentation

## 2018-01-29 DIAGNOSIS — O24119 Pre-existing diabetes mellitus, type 2, in pregnancy, unspecified trimester: Secondary | ICD-10-CM

## 2018-02-05 ENCOUNTER — Other Ambulatory Visit: Payer: 59

## 2018-02-05 DIAGNOSIS — O24410 Gestational diabetes mellitus in pregnancy, diet controlled: Secondary | ICD-10-CM | POA: Diagnosis not present

## 2018-02-06 LAB — HEMOGLOBIN A1C
ESTIMATED AVERAGE GLUCOSE: 134 mg/dL
HEMOGLOBIN A1C: 6.3 % — AB (ref 4.8–5.6)

## 2018-02-17 ENCOUNTER — Encounter (HOSPITAL_COMMUNITY): Payer: Self-pay

## 2018-02-17 DIAGNOSIS — O24119 Pre-existing diabetes mellitus, type 2, in pregnancy, unspecified trimester: Secondary | ICD-10-CM | POA: Diagnosis not present

## 2018-02-17 DIAGNOSIS — O35BXX1 Maternal care for other (suspected) fetal abnormality and damage, fetal cardiac anomalies, fetus 1: Secondary | ICD-10-CM | POA: Insufficient documentation

## 2018-02-17 DIAGNOSIS — O358XX1 Maternal care for other (suspected) fetal abnormality and damage, fetus 1: Secondary | ICD-10-CM | POA: Diagnosis not present

## 2018-02-17 DIAGNOSIS — R9431 Abnormal electrocardiogram [ECG] [EKG]: Secondary | ICD-10-CM | POA: Diagnosis not present

## 2018-02-17 DIAGNOSIS — Z3A28 28 weeks gestation of pregnancy: Secondary | ICD-10-CM | POA: Diagnosis not present

## 2018-02-19 ENCOUNTER — Encounter: Payer: 59 | Admitting: Family Medicine

## 2018-02-19 ENCOUNTER — Ambulatory Visit (HOSPITAL_COMMUNITY)
Admission: RE | Admit: 2018-02-19 | Discharge: 2018-02-19 | Disposition: A | Discharge status: Home / Self Care | Payer: 59 | Source: Ambulatory Visit | Attending: Obstetrics & Gynecology | Admitting: Obstetrics & Gynecology

## 2018-02-19 NOTE — Progress Notes (Deleted)
Influenza vaccine Tdap vaccine  Need to schedule Labs: 2 hr gtt CBC HIV RPR

## 2018-02-26 ENCOUNTER — Ambulatory Visit (HOSPITAL_COMMUNITY)
Admission: RE | Admit: 2018-02-26 | Discharge: 2018-02-26 | Disposition: A | Discharge status: Home / Self Care | Payer: Medicaid Other | Source: Ambulatory Visit | Attending: Obstetrics & Gynecology | Admitting: Obstetrics & Gynecology

## 2018-02-26 ENCOUNTER — Encounter (HOSPITAL_COMMUNITY): Payer: Self-pay

## 2018-02-26 ENCOUNTER — Other Ambulatory Visit (HOSPITAL_COMMUNITY): Payer: Self-pay | Admitting: *Deleted

## 2018-02-26 DIAGNOSIS — O99212 Obesity complicating pregnancy, second trimester: Secondary | ICD-10-CM | POA: Diagnosis not present

## 2018-02-26 DIAGNOSIS — O10913 Unspecified pre-existing hypertension complicating pregnancy, third trimester: Secondary | ICD-10-CM | POA: Diagnosis not present

## 2018-02-26 DIAGNOSIS — O24112 Pre-existing diabetes mellitus, type 2, in pregnancy, second trimester: Secondary | ICD-10-CM | POA: Diagnosis not present

## 2018-02-26 DIAGNOSIS — O10012 Pre-existing essential hypertension complicating pregnancy, second trimester: Secondary | ICD-10-CM | POA: Diagnosis not present

## 2018-02-26 DIAGNOSIS — O24113 Pre-existing diabetes mellitus, type 2, in pregnancy, third trimester: Secondary | ICD-10-CM | POA: Diagnosis not present

## 2018-02-26 DIAGNOSIS — E119 Type 2 diabetes mellitus without complications: Secondary | ICD-10-CM

## 2018-02-26 DIAGNOSIS — O34219 Maternal care for unspecified type scar from previous cesarean delivery: Secondary | ICD-10-CM | POA: Diagnosis not present

## 2018-02-26 DIAGNOSIS — O10919 Unspecified pre-existing hypertension complicating pregnancy, unspecified trimester: Secondary | ICD-10-CM

## 2018-02-26 DIAGNOSIS — Z362 Encounter for other antenatal screening follow-up: Secondary | ICD-10-CM

## 2018-02-26 DIAGNOSIS — Z3A30 30 weeks gestation of pregnancy: Secondary | ICD-10-CM

## 2018-02-26 DIAGNOSIS — Z794 Long term (current) use of insulin: Principal | ICD-10-CM

## 2018-03-05 ENCOUNTER — Ambulatory Visit (HOSPITAL_COMMUNITY)
Admission: RE | Admit: 2018-03-05 | Discharge: 2018-03-05 | Disposition: A | Discharge status: Home / Self Care | Payer: 59 | Source: Ambulatory Visit | Attending: Obstetrics & Gynecology | Admitting: Obstetrics & Gynecology

## 2018-03-05 ENCOUNTER — Encounter (HOSPITAL_COMMUNITY): Payer: Self-pay

## 2018-03-11 DIAGNOSIS — R7881 Bacteremia: Secondary | ICD-10-CM

## 2018-03-11 HISTORY — DX: Bacteremia: R78.81

## 2018-03-12 ENCOUNTER — Ambulatory Visit (HOSPITAL_COMMUNITY)
Admission: RE | Admit: 2018-03-12 | Discharge: 2018-03-12 | Disposition: A | Discharge status: Home / Self Care | Payer: Medicaid Other | Source: Ambulatory Visit | Attending: Obstetrics & Gynecology | Admitting: Obstetrics & Gynecology

## 2018-03-12 ENCOUNTER — Other Ambulatory Visit (HOSPITAL_COMMUNITY): Payer: Self-pay | Admitting: *Deleted

## 2018-03-12 ENCOUNTER — Other Ambulatory Visit (HOSPITAL_COMMUNITY): Payer: Self-pay | Admitting: Obstetrics and Gynecology

## 2018-03-12 DIAGNOSIS — Z362 Encounter for other antenatal screening follow-up: Secondary | ICD-10-CM | POA: Diagnosis not present

## 2018-03-12 DIAGNOSIS — E119 Type 2 diabetes mellitus without complications: Secondary | ICD-10-CM | POA: Insufficient documentation

## 2018-03-12 DIAGNOSIS — O24919 Unspecified diabetes mellitus in pregnancy, unspecified trimester: Secondary | ICD-10-CM

## 2018-03-12 DIAGNOSIS — O10013 Pre-existing essential hypertension complicating pregnancy, third trimester: Secondary | ICD-10-CM | POA: Diagnosis not present

## 2018-03-12 DIAGNOSIS — Z794 Long term (current) use of insulin: Secondary | ICD-10-CM | POA: Diagnosis present

## 2018-03-12 DIAGNOSIS — O24113 Pre-existing diabetes mellitus, type 2, in pregnancy, third trimester: Secondary | ICD-10-CM | POA: Diagnosis not present

## 2018-03-12 DIAGNOSIS — Z3A32 32 weeks gestation of pregnancy: Secondary | ICD-10-CM | POA: Diagnosis not present

## 2018-03-12 DIAGNOSIS — O0990 Supervision of high risk pregnancy, unspecified, unspecified trimester: Secondary | ICD-10-CM

## 2018-03-12 DIAGNOSIS — O10919 Unspecified pre-existing hypertension complicating pregnancy, unspecified trimester: Secondary | ICD-10-CM

## 2018-03-12 DIAGNOSIS — O99210 Obesity complicating pregnancy, unspecified trimester: Secondary | ICD-10-CM

## 2018-03-12 DIAGNOSIS — O34219 Maternal care for unspecified type scar from previous cesarean delivery: Secondary | ICD-10-CM | POA: Diagnosis not present

## 2018-03-17 ENCOUNTER — Telehealth: Payer: Self-pay | Admitting: Radiology

## 2018-03-17 NOTE — Telephone Encounter (Signed)
Spoke with patient who states that she wants to continue care at [facility] because coming to the hospital every week for ultrasounds and then to [facility] is to much for her schedule. She is unable to do all of the appointments with what she has going on daily.

## 2018-03-20 ENCOUNTER — Other Ambulatory Visit: Payer: Self-pay | Admitting: Student

## 2018-03-20 ENCOUNTER — Encounter (HOSPITAL_COMMUNITY): Payer: Self-pay | Admitting: *Deleted

## 2018-03-20 ENCOUNTER — Other Ambulatory Visit (HOSPITAL_COMMUNITY): Payer: Self-pay | Admitting: Obstetrics and Gynecology

## 2018-03-20 ENCOUNTER — Encounter (HOSPITAL_COMMUNITY): Payer: Self-pay

## 2018-03-20 ENCOUNTER — Ambulatory Visit (HOSPITAL_BASED_OUTPATIENT_CLINIC_OR_DEPARTMENT_OTHER)
Admission: RE | Admit: 2018-03-20 | Discharge: 2018-03-20 | Disposition: A | Discharge status: Home / Self Care | Payer: Medicaid Other | Source: Ambulatory Visit | Attending: Obstetrics & Gynecology | Admitting: Obstetrics & Gynecology

## 2018-03-20 ENCOUNTER — Inpatient Hospital Stay (HOSPITAL_COMMUNITY)
Admission: AD | Admit: 2018-03-20 | Discharge: 2018-03-23 | DRG: 805 | Disposition: A | Discharge status: Home / Self Care | Payer: Medicaid Other | Attending: Family Medicine | Admitting: Family Medicine

## 2018-03-20 DIAGNOSIS — O99210 Obesity complicating pregnancy, unspecified trimester: Secondary | ICD-10-CM

## 2018-03-20 DIAGNOSIS — O0990 Supervision of high risk pregnancy, unspecified, unspecified trimester: Secondary | ICD-10-CM

## 2018-03-20 DIAGNOSIS — O1002 Pre-existing essential hypertension complicating childbirth: Secondary | ICD-10-CM | POA: Diagnosis present

## 2018-03-20 DIAGNOSIS — O10919 Unspecified pre-existing hypertension complicating pregnancy, unspecified trimester: Secondary | ICD-10-CM

## 2018-03-20 DIAGNOSIS — O34219 Maternal care for unspecified type scar from previous cesarean delivery: Secondary | ICD-10-CM

## 2018-03-20 DIAGNOSIS — Z794 Long term (current) use of insulin: Principal | ICD-10-CM

## 2018-03-20 DIAGNOSIS — O364XX0 Maternal care for intrauterine death, not applicable or unspecified: Secondary | ICD-10-CM | POA: Diagnosis present

## 2018-03-20 DIAGNOSIS — Z3A33 33 weeks gestation of pregnancy: Secondary | ICD-10-CM | POA: Insufficient documentation

## 2018-03-20 DIAGNOSIS — O24113 Pre-existing diabetes mellitus, type 2, in pregnancy, third trimester: Secondary | ICD-10-CM | POA: Insufficient documentation

## 2018-03-20 DIAGNOSIS — E119 Type 2 diabetes mellitus without complications: Secondary | ICD-10-CM

## 2018-03-20 DIAGNOSIS — O119 Pre-existing hypertension with pre-eclampsia, unspecified trimester: Secondary | ICD-10-CM | POA: Diagnosis present

## 2018-03-20 DIAGNOSIS — Z87891 Personal history of nicotine dependence: Secondary | ICD-10-CM

## 2018-03-20 DIAGNOSIS — E669 Obesity, unspecified: Secondary | ICD-10-CM | POA: Diagnosis present

## 2018-03-20 DIAGNOSIS — O2412 Pre-existing diabetes mellitus, type 2, in childbirth: Secondary | ICD-10-CM | POA: Diagnosis present

## 2018-03-20 DIAGNOSIS — O99214 Obesity complicating childbirth: Secondary | ICD-10-CM | POA: Diagnosis present

## 2018-03-20 DIAGNOSIS — O24919 Unspecified diabetes mellitus in pregnancy, unspecified trimester: Secondary | ICD-10-CM | POA: Diagnosis present

## 2018-03-20 DIAGNOSIS — E1159 Type 2 diabetes mellitus with other circulatory complications: Secondary | ICD-10-CM | POA: Diagnosis present

## 2018-03-20 DIAGNOSIS — I10 Essential (primary) hypertension: Secondary | ICD-10-CM | POA: Diagnosis present

## 2018-03-20 DIAGNOSIS — O35BXX1 Maternal care for other (suspected) fetal abnormality and damage, fetal cardiac anomalies, fetus 1: Secondary | ICD-10-CM | POA: Diagnosis present

## 2018-03-20 DIAGNOSIS — O403XX0 Polyhydramnios, third trimester, not applicable or unspecified: Secondary | ICD-10-CM | POA: Diagnosis present

## 2018-03-20 DIAGNOSIS — Z6834 Body mass index (BMI) 34.0-34.9, adult: Secondary | ICD-10-CM | POA: Diagnosis present

## 2018-03-20 DIAGNOSIS — O358XX1 Maternal care for other (suspected) fetal abnormality and damage, fetus 1: Secondary | ICD-10-CM | POA: Diagnosis present

## 2018-03-20 LAB — TYPE AND SCREEN
ABO/RH(D): O POS
ANTIBODY SCREEN: NEGATIVE

## 2018-03-20 LAB — CBC
HEMATOCRIT: 34.2 % — AB (ref 36.0–46.0)
Hemoglobin: 11.3 g/dL — ABNORMAL LOW (ref 12.0–15.0)
MCH: 25.7 pg — AB (ref 26.0–34.0)
MCHC: 33 g/dL (ref 30.0–36.0)
MCV: 77.9 fL — ABNORMAL LOW (ref 80.0–100.0)
Platelets: 195 10*3/uL (ref 150–400)
RBC: 4.39 MIL/uL (ref 3.87–5.11)
RDW: 14.1 % (ref 11.5–15.5)
WBC: 8.9 10*3/uL (ref 4.0–10.5)
nRBC: 0 % (ref 0.0–0.2)

## 2018-03-20 LAB — GLUCOSE, CAPILLARY
GLUCOSE-CAPILLARY: 157 mg/dL — AB (ref 70–99)
GLUCOSE-CAPILLARY: 245 mg/dL — AB (ref 70–99)

## 2018-03-20 MED ORDER — LABETALOL HCL 100 MG PO TABS
200.0000 mg | ORAL_TABLET | Freq: Two times a day (BID) | ORAL | Status: DC
  Administered 2018-03-20 – 2018-03-22 (×5): 200 mg via ORAL
  Filled 2018-03-20 (×5): qty 1

## 2018-03-20 MED ORDER — LIDOCAINE HCL (PF) 1 % IJ SOLN
30.0000 mL | INTRAMUSCULAR | Status: DC | PRN
  Administered 2018-03-22: 30 mL via SUBCUTANEOUS
  Filled 2018-03-20: qty 30

## 2018-03-20 MED ORDER — OXYCODONE-ACETAMINOPHEN 5-325 MG PO TABS: 2.0000 | ORAL_TABLET | ORAL | Status: DC | PRN

## 2018-03-20 MED ORDER — INSULIN GLARGINE 100 UNIT/ML ~~LOC~~ SOLN
13.0000 [IU] | Freq: Every day | SUBCUTANEOUS | Status: DC
  Filled 2018-03-20 (×2): qty 0.13

## 2018-03-20 MED ORDER — INSULIN ASPART 100 UNIT/ML ~~LOC~~ SOLN
0.0000 [IU] | SUBCUTANEOUS | Status: DC
  Administered 2018-03-20: 6 [IU] via SUBCUTANEOUS
  Administered 2018-03-21: 2 [IU] via SUBCUTANEOUS

## 2018-03-20 MED ORDER — SOD CITRATE-CITRIC ACID 500-334 MG/5ML PO SOLN: 30.0000 mL | ORAL | Status: DC | PRN

## 2018-03-20 MED ORDER — OXYTOCIN BOLUS FROM INFUSION
500.0000 mL | Freq: Once | INTRAVENOUS | Status: AC
  Administered 2018-03-22: 500 mL via INTRAVENOUS

## 2018-03-20 MED ORDER — MISOPROSTOL 200 MCG PO TABS: 200.0000 ug | ORAL_TABLET | Freq: Once | ORAL | Status: DC

## 2018-03-20 MED ORDER — LACTATED RINGERS IV SOLN
INTRAVENOUS | Status: DC
  Administered 2018-03-20 – 2018-03-22 (×6): via INTRAVENOUS

## 2018-03-20 MED ORDER — LACTATED RINGERS IV SOLN: 500.0000 mL | INTRAVENOUS | Status: DC | PRN

## 2018-03-20 MED ORDER — METFORMIN HCL 500 MG PO TABS
1000.0000 mg | ORAL_TABLET | Freq: Two times a day (BID) | ORAL | Status: DC
  Administered 2018-03-20 – 2018-03-21 (×3): 1000 mg via ORAL
  Filled 2018-03-20 (×4): qty 2

## 2018-03-20 MED ORDER — OXYCODONE-ACETAMINOPHEN 5-325 MG PO TABS: 1.0000 | ORAL_TABLET | ORAL | Status: DC | PRN

## 2018-03-20 MED ORDER — ACETAMINOPHEN 325 MG PO TABS: 650.0000 mg | ORAL_TABLET | ORAL | Status: DC | PRN

## 2018-03-20 MED ORDER — ONDANSETRON HCL 4 MG/2ML IJ SOLN: 4.0000 mg | Freq: Four times a day (QID) | INTRAMUSCULAR | Status: DC | PRN

## 2018-03-20 MED ORDER — OXYTOCIN 40 UNITS IN LACTATED RINGERS INFUSION - SIMPLE MED: 2.5000 [IU]/h | INTRAVENOUS | Status: DC

## 2018-03-20 MED ORDER — MISOPROSTOL 50MCG HALF TABLET
50.0000 ug | ORAL_TABLET | Freq: Once | ORAL | Status: AC
  Administered 2018-03-20: 50 ug via ORAL
  Filled 2018-03-20: qty 1

## 2018-03-20 MED ORDER — MISOPROSTOL 200 MCG PO TABS: 200.0000 ug | ORAL_TABLET | ORAL | Status: DC | PRN

## 2018-03-20 MED ORDER — MISOPROSTOL 25 MCG QUARTER TABLET
25.0000 ug | ORAL_TABLET | ORAL | Status: DC
  Administered 2018-03-21 – 2018-03-22 (×6): 25 ug via VAGINAL
  Filled 2018-03-20 (×7): qty 1

## 2018-03-20 MED ORDER — ZOLPIDEM TARTRATE 5 MG PO TABS
5.0000 mg | ORAL_TABLET | Freq: Every evening | ORAL | Status: DC | PRN
  Administered 2018-03-21 – 2018-03-22 (×2): 5 mg via ORAL
  Filled 2018-03-20 (×2): qty 1

## 2018-03-20 MED ORDER — MISOPROSTOL 50MCG HALF TABLET: 50.0000 ug | ORAL_TABLET | ORAL | Status: DC

## 2018-03-20 NOTE — Progress Notes (Signed)
Pt was lying in bed and awake when I arrived. Her nurse was with her. She was composed and we talked about her thoughts of any spiritual care needs when her baby is born. She indicated that they had been praying. I told her if she and her husband would like anything special when she delivers to let us know. Her sister and father arrived at which time she sobbed as her dad hugged her. She said she is okay and then she gets herself worked back up. I assured her that is part of the normal process. She said she knows her baby is okay. She said to she cannot believe this is part of her reality. I provided pastoral support, affirmation of her feelings and questions and offered continued support as needed. She very grateful for visit.  Please page if additional support is needed. Chaplain Marjory Lies, [place]   03/20/18 2100  Clinical Encounter Type  Visited With Patient and family together

## 2018-03-20 NOTE — ED Notes (Signed)
Chaplain Theda Belfast here. Patient and husband agree to see him.

## 2018-03-20 NOTE — ED Notes (Signed)
Patient and husband choose to be admitted today for delivery, but need to take car seats for other children to her sister. She will return and be admitted to room 175. Belenda Cruise, RN, CN in L&D notified. [place] in admitting also notified to expect patient.

## 2018-03-20 NOTE — ED Notes (Signed)
Dr. Alysia Penna reviewed med rec and is now in with patient and husband to discuss plan of care.

## 2018-03-20 NOTE — Progress Notes (Signed)
Patient ID: Rosealie Reach, female   DOB: 05-21-1986, 32 y.o.   MRN: [id number] [facility] Attending  IOL process reviewed with pt.  Desire for vaginal delivery vs c section discussed Risks of each reviewed including uterine rupture. Questions answered Pt agreeable with proceeding with TOLAC  PE  GU nl EGBUS Cervix FT, medium laminaria placed without problems, 50 mcg Cytotec placed as well. Pt tolerated well  Continue with IOL d/t FDIU at 33 weeks.

## 2018-03-20 NOTE — Progress Notes (Addendum)
Chaplain paged for comfort services. Susanne Borders CNM made aware.   Adah Perl RN

## 2018-03-20 NOTE — Progress Notes (Signed)
Admission process delayed to allow patient time to process fetal loss.  Patient in and out of heavy crying and visiting with friends and family.

## 2018-03-20 NOTE — ED Notes (Addendum)
Report of IUFD called to K. Crisoforo Oxford, CNM. Patient and husb very upset and crying. Offered to call chaplain. Patient and husband decline. Cleone Slim, RN, CN in L&D also given report.

## 2018-03-20 NOTE — H&P (Addendum)
Joyce Green is a 32 y.o. female G3P2002 with IUP at 38w1dpresenting for IOL secondary to IUFD. Pt states she has been having none contractions, associated with none vaginal bleeding for no hours..  Membranes are intact, with decreased fetal movements since yesterday. [facility]; however, patient had not had any prenatal visits since August, 2019.   Prenatal History/Complications:  Past Medical History: Past Medical History:  Diagnosis Date  . [place]   . Hypertension   . Palpitations     Past Surgical History: Past Surgical History:  Procedure Laterality Date  . CESAREAN SECTION N/A 02/20/2014   Procedure: CESAREAN SECTION;  Surgeon: LFlorian Buff MD;  [facility];  Service: Obstetrics;  Laterality: N/A;  . CESAREAN SECTION N/A 07/28/2016   Procedure: CESAREAN SECTION;  Surgeon: PMora Bellman MD;  [place]  Service: Obstetrics;  Laterality: N/A;    Obstetrical History: OB History    Gravida  3   Para  2   Term  2   Preterm  0   AB  0   Living  2     SAB  0   TAB  0   Ectopic  0   Multiple  0   Live Births  2            Social History: Social History   Socioeconomic History  . Marital status: Married    Spouse name: Not on file  . Number of children: Not on file  . Years of education: Not on file  . Highest education level: Not on file  Occupational History  . Not on file  Social Needs  . Financial resource strain: Not on file  . Food insecurity:    Worry: Not on file    Inability: Not on file  . Transportation needs:    Medical: Not on file    Non-medical: Not on file  Tobacco Use  . Smoking status: Former Smoker    Packs/day: 0.00    Years: 0.00    Pack years: 0.00    Last attempt to quit: 06/11/2009    Years since quitting: 8.7  . Smokeless tobacco: Never Used  . Tobacco comment: not while pregnant  Substance and Sexual Activity  . Alcohol use: Yes     Alcohol/week: 4.0 standard drinks    Types: 2 Glasses of wine, 2 Cans of beer per week    Comment: occasional; stopped after found out she was pregnant  . Drug use: No    Comment: per prenatal record, +THC on initial visit u/a; no current use  . Sexual activity: Yes    Partners: Male    Birth control/protection: None  Lifestyle  . Physical activity:    Days per week: Not on file    Minutes per session: Not on file  . Stress: Not on file  Relationships  . Social connections:    Talks on phone: Not on file    Gets together: Not on file    Attends religious service: Not on file    Active member of club or organization: Not on file    Attends meetings of clubs or organizations: Not on file    Relationship status: Not on file  Other Topics Concern  . Not on file  Social History Narrative  . Not on file    Family History: Family History  Problem Relation Age of Onset  . Diabetes Mellitus I Mother   .  Hypertension Mother   . Diabetes Mother   . Hypertension Father   . Cancer Maternal Grandmother   . Cancer Maternal Grandfather   . Cancer Paternal Grandmother   . Cancer Paternal Grandfather   . Early death Neg Hx   . Heart disease Neg Hx   . Hyperlipidemia Neg Hx   . Kidney disease Neg Hx   . Stroke Neg Hx     Allergies: No Known Allergies  Medications Prior to Admission  Medication Sig Dispense Refill Last Dose  . ACCU-CHEK FASTCLIX LANCETS MISC 1 Units by Percutaneous route 4 (four) times daily. 120 each 12 Taking  . ACCU-CHEK FASTCLIX LANCETS MISC Check blood sugars up to four times a day 100 each 4 Taking  . acetaminophen (TYLENOL) 325 MG tablet Take 650 mg by mouth every 6 (six) hours as needed.   Not Taking  . aspirin EC 81 MG tablet Take 81 mg by mouth daily.   Taking  . Blood Glucose Monitoring Suppl (ACCU-CHEK NANO SMARTVIEW) w/Device KIT 1 kit by Subdermal route as directed. Check blood sugars for fasting, and two hours after breakfast, lunch and dinner (4  checks daily) 1 kit 0 Taking  . cyclobenzaprine (FLEXERIL) 10 MG tablet Take 1 tablet (10 mg total) by mouth 3 (three) times daily as needed for muscle spasms. 30 tablet 1 Taking  . glucose blood (ACCU-CHEK SMARTVIEW) test strip Use as instructed to check blood sugars 120 each 12 Taking  . glucose blood test strip Use as instructed 100 each 12 Taking  . Insulin Glargine (LANTUS SOLOSTAR) 100 UNIT/ML Solostar Pen Inject 13 Units into the skin daily at 10 pm. 15 mL 11 Taking  . labetalol (NORMODYNE) 200 MG tablet Take 1 tablet (200 mg total) by mouth 2 (two) times daily. 60 tablet 3 Taking  . metFORMIN (GLUCOPHAGE) 1000 MG tablet Take 1 tablet (1,000 mg total) by mouth 2 (two) times daily with a meal. 60 tablet 11 Taking  . Prenatal Vit-Fe Fumarate-FA (PRENATAL COMPLETE PO) Take 1 tablet by mouth daily.   Taking        Review of Systems   Constitutional: Negative for fever and chills Eyes: Negative for visual disturbances Respiratory: Negative for shortness of breath, dyspnea Cardiovascular: Negative for chest pain or palpitations  Gastrointestinal: Negative for vomiting, diarrhea and constipation.  POSITIVE for abdominal pain (contractions) Genitourinary: Negative for dysuria and urgency Musculoskeletal: Negative for back pain, joint pain, myalgias  Neurological: Negative for dizziness and headaches      Last menstrual period 08/23/2017, currently breastfeeding. General appearance: alert, cooperative and severe distress Lungs: clear to auscultation bilaterally Heart: regular rate and rhythm Abdomen: soft, non-tender; bowel sounds normal Extremities: Homans sign is negative, no sign of DVT DTR's 2+ Presentation: cephalic Uterine activity  Intensity: quiet     Prenatal labs: ABO, Rh: O/Positive/-- (05/29 1510) Antibody: Negative (05/29 1510) Rubella: 9.16 (05/29 1510) RPR: Non Reactive (05/29 1510)  HBsAg: Negative (05/29 1510)  HIV: Non Reactive (05/29 1510)  GBS:   not  collected 1 hr Glucola NA; Type 2 diabetic Genetic screening  normal Anatomy US had mild narrowing of ductus arteriosus but repeat on 02-27-2018 was normal.   Prenatal Transfer Tool  Maternal Diabetes: Yes:  Diabetes Type:  Insulin/Medication controlled Genetic Screening: Normal Maternal Ultrasounds/Referrals: Normal Fetal Ultrasounds or other Referrals:  Other:  BPP with polyhydramnios 24.26 (borderline poly) ; EFW in 85%; narrow ductus arteriosis resolved after echo in September.  Maternal Substance Abuse:  No Significant Maternal  Medications:  Insulin and labetalol Significant Maternal Lab Results: None     No results found for this or any previous visit (from the past 24 hour(s)).  Assessment: Joyce Green is a 32 y.o. [id number] with an IUFD IUP at 3w1dpresenting for IOL. She has a history of chronic hypertension and diabetes mellitus.   Plan: #Labor: CNM will check cervix and determine if laminaria placement is appropriate. If not, will consult with MD.    #Pain:  Per request #FWB : NA #ID: GBS: NA   #MOF:  NA #MOC: NA #Circ: NA   KMervyn SkeetersKooistra 03/20/2018, 7:21 PM    Attestation of Attending Supervision of Advanced Practice Provider (PA/CNM/NP): Evaluation and management procedures were performed by the Advanced Practice Provider under my supervision and collaboration.  I have reviewed the Advanced Practice Provider's note and chart, and I agree with the management and plan.  Beryle Bagsby L. ERip Harbour MD, FSpring LakeAttending O[facility]

## 2018-03-20 NOTE — Progress Notes (Signed)
Requested chaplain consult per Luna Kitchens CNM. Front desk L&D paged chaplain.   Adah Perl RN

## 2018-03-21 ENCOUNTER — Inpatient Hospital Stay (HOSPITAL_COMMUNITY): Payer: Medicaid Other | Admitting: Anesthesiology

## 2018-03-21 LAB — COMPREHENSIVE METABOLIC PANEL
ALT: 9 U/L (ref 0–44)
AST: 14 U/L — ABNORMAL LOW (ref 15–41)
Albumin: 2.7 g/dL — ABNORMAL LOW (ref 3.5–5.0)
Alkaline Phosphatase: 60 U/L (ref 38–126)
Anion gap: 10 (ref 5–15)
BUN: 5 mg/dL — ABNORMAL LOW (ref 6–20)
CO2: 17 mmol/L — ABNORMAL LOW (ref 22–32)
Calcium: 8.6 mg/dL — ABNORMAL LOW (ref 8.9–10.3)
Chloride: 107 mmol/L (ref 98–111)
Creatinine, Ser: 0.45 mg/dL (ref 0.44–1.00)
GFR calc Af Amer: >60 mL/min (ref >60)
GFR calc non Af Amer: >60 mL/min (ref >60)
Glucose, Bld: 94 mg/dL (ref 70–99)
Potassium: 3.5 mmol/L (ref 3.5–5.1)
Sodium: 134 mmol/L — ABNORMAL LOW (ref 135–145)
Total Bilirubin: 0.4 mg/dL (ref 0.3–1.2)
Total Protein: 6.6 g/dL (ref 6.5–8.1)

## 2018-03-21 LAB — CBC
HCT: 33.6 % — ABNORMAL LOW (ref 36.0–46.0)
Hemoglobin: 11.2 g/dL — ABNORMAL LOW (ref 12.0–15.0)
MCH: 26 pg (ref 26.0–34.0)
MCHC: 33.3 g/dL (ref 30.0–36.0)
MCV: 78.1 fL — ABNORMAL LOW (ref 80.0–100.0)
Platelets: 169 10*3/uL (ref 150–400)
RBC: 4.3 MIL/uL (ref 3.87–5.11)
RDW: 14.5 % (ref 11.5–15.5)
WBC: 9.1 10*3/uL (ref 4.0–10.5)
nRBC: 0 % (ref 0.0–0.2)

## 2018-03-21 LAB — GLUCOSE, CAPILLARY
GLUCOSE-CAPILLARY: 102 mg/dL — AB (ref 70–99)
GLUCOSE-CAPILLARY: 92 mg/dL (ref 70–99)
Glucose-Capillary: 65 mg/dL — ABNORMAL LOW (ref 70–99)
Glucose-Capillary: 156 mg/dL — ABNORMAL HIGH (ref 70–99)
Glucose-Capillary: 84 mg/dL (ref 70–99)
Glucose-Capillary: 115 mg/dL — ABNORMAL HIGH (ref 70–99)
Glucose-Capillary: 126 mg/dL — ABNORMAL HIGH (ref 70–99)

## 2018-03-21 LAB — RPR: RPR Ser Ql: NONREACTIVE

## 2018-03-21 MED ORDER — EPHEDRINE 5 MG/ML INJ
10.0000 mg | INTRAVENOUS | Status: DC | PRN
  Filled 2018-03-21: qty 2

## 2018-03-21 MED ORDER — FENTANYL 2.5 MCG/ML BUPIVACAINE 1/10 % EPIDURAL INFUSION (WH - ANES)
14.0000 mL/h | INTRAMUSCULAR | Status: DC | PRN
  Administered 2018-03-21 – 2018-03-22 (×3): 14 mL/h via EPIDURAL
  Filled 2018-03-21 (×3): qty 100

## 2018-03-21 MED ORDER — PHENYLEPHRINE 40 MCG/ML (10ML) SYRINGE FOR IV PUSH (FOR BLOOD PRESSURE SUPPORT)
80.0000 ug | PREFILLED_SYRINGE | INTRAVENOUS | Status: DC | PRN
  Filled 2018-03-21: qty 10
  Filled 2018-03-21: qty 5

## 2018-03-21 MED ORDER — INSULIN ASPART 100 UNIT/ML ~~LOC~~ SOLN
0.0000 [IU] | Freq: Every day | SUBCUTANEOUS | Status: DC
  Administered 2018-03-22: 2 [IU] via SUBCUTANEOUS

## 2018-03-21 MED ORDER — MISOPROSTOL 50MCG HALF TABLET
50.0000 ug | ORAL_TABLET | ORAL | Status: DC
  Administered 2018-03-21: 50 ug via ORAL
  Filled 2018-03-21: qty 1

## 2018-03-21 MED ORDER — PHENYLEPHRINE 40 MCG/ML (10ML) SYRINGE FOR IV PUSH (FOR BLOOD PRESSURE SUPPORT)
80.0000 ug | PREFILLED_SYRINGE | INTRAVENOUS | Status: DC | PRN
  Filled 2018-03-21: qty 5

## 2018-03-21 MED ORDER — DIPHENHYDRAMINE HCL 50 MG/ML IJ SOLN: 12.5000 mg | INTRAMUSCULAR | Status: DC | PRN

## 2018-03-21 MED ORDER — FENTANYL CITRATE (PF) 100 MCG/2ML IJ SOLN
100.0000 ug | INTRAMUSCULAR | Status: DC | PRN
  Administered 2018-03-21 (×3): 100 ug via INTRAVENOUS
  Filled 2018-03-21 (×3): qty 2

## 2018-03-21 MED ORDER — LACTATED RINGERS IV SOLN
500.0000 mL | Freq: Once | INTRAVENOUS | Status: AC
  Administered 2018-03-21: 500 mL via INTRAVENOUS

## 2018-03-21 MED ORDER — LIDOCAINE HCL (PF) 1 % IJ SOLN
INTRAMUSCULAR | Status: DC | PRN
  Administered 2018-03-21: 10 mL via EPIDURAL

## 2018-03-21 MED ORDER — INSULIN ASPART 100 UNIT/ML ~~LOC~~ SOLN
0.0000 [IU] | Freq: Three times a day (TID) | SUBCUTANEOUS | Status: DC
  Administered 2018-03-22: 2 [IU] via SUBCUTANEOUS

## 2018-03-21 MED ORDER — INSULIN GLARGINE 100 UNIT/ML ~~LOC~~ SOLN
8.0000 [IU] | Freq: Every day | SUBCUTANEOUS | Status: DC
  Administered 2018-03-21: 8 [IU] via SUBCUTANEOUS
  Filled 2018-03-21: qty 0.08

## 2018-03-21 NOTE — Progress Notes (Signed)
Patient Vitals for the past 4 hrs:  BP Temp Temp src Pulse Resp Height  03/21/18 0605 137/83 97.7 F (36.5 C) Oral (!) 109 20 -  03/21/18 0248 - - - - - 5\' 10"  (1.778 m)   Blood sugar 156 Cx still somewhat firm, laminaria expanded sl more; will place 3rd cytotec ( ) and anticipate pulling laminaria with next check. Pt grieving appropriately w/support from her family.

## 2018-03-21 NOTE — Anesthesia Procedure Notes (Signed)
Epidural Patient location during procedure: OB Start time: 03/21/2018 10:28 PM End time: 03/21/2018 10:44 PM  Staffing Anesthesiologist: Lucretia Kern, MD Performed: anesthesiologist   Preanesthetic Checklist Completed: patient identified, pre-op evaluation, timeout performed, IV checked, risks and benefits discussed and monitors and equipment checked  Epidural Patient position: sitting Prep: DuraPrep Patient monitoring: heart rate, continuous pulse ox and blood pressure Approach: midline Location: L3-L4 Injection technique: LOR saline  Needle:  Needle type: Tuohy  Needle gauge: 17 G Needle length: 9 cm Needle insertion depth: 7 cm Catheter type: closed end flexible Catheter size: 19 Gauge Catheter at skin depth: 12 cm  Assessment Events: blood not aspirated, injection not painful, no injection resistance, negative IV test and no paresthesia  Additional Notes Reason for block:procedure for pain

## 2018-03-21 NOTE — Progress Notes (Signed)
Chaplain Note:  I was paged by [facility] chaplain to follow up on a request for a chaplain for patient and husband who had just been told that no heart beat was detected for their baby. Pt is at 33 weeks -  When I arrived I was told couple had declined chaplain visit - in discussion with nurse we decided that it may be valuable for them to at least have a personal contact with a chaplain and if they did not wish to talk with me that would be honored.   I introduced myself to patient Joyce Green and her her husband and at that point they were receptive to me talking with them. They were both tearful and talked about their shock that this had occurred when, in their words, " this pregnancy had been smoother"  than any of her previouson ones. Following a period of talking about how to talk with their four year old daughter and help understand and what they each needed in terms of support from family, the doctor entered and discussed options for delivery. Joyce Green indicated she wanted to go ahead with the delivery then rather than return the next day. They decided that they needed to get car seats to husbands father to pick up their other children and that they would then return to hospital for admission. I shared this with Nurse, Bobbe Medico and she followed up with couple on their plans.   I asked if they would be open to our night chaplain coming by to see them this evening and Pt. Said she would appreciate that -   I made contact with Marjory Lies night chaplain and Dyanne Carrel [facility] staff chaplain for follow up.   Kathlyn Sacramento  Director Spiritual Care Services # [phone]

## 2018-03-21 NOTE — Progress Notes (Signed)
LABOR PROGRESS NOTE  Joyce Green is a 32 y.o. G3P2002 at [redacted]w[redacted]d  admitted for IOL due to IUFD.   Subjective: Doing well, had some breakfast this morning. Feeling some cramping. Has a great family support system present with her.   Objective: BP 135/80   Pulse (!) 106   Temp 97.9 F (36.6 C) (Oral)   Resp 18   Ht 5\' 10"  (1.778 m)   LMP 08/23/2017   SpO2 99%   BMI 36.34 kg/m  or  Vitals:   03/21/18 0904 03/21/18 1015 03/21/18 1125 03/21/18 1222  BP:  (!) 149/97 126/83 135/80  Pulse:  (!) 112 (!) 110 (!) 106  Resp: 18 18 20 18   Temp:  97.9 F (36.6 C)    TempSrc:  Oral    SpO2:      Height:         Dilation: Fingertip Effacement (%): Thick Station: Ballotable Presentation: Undeterminable Exam by:: Kooistra, CNM and Dr. Annia Friendly FHT: N/A   Labs: Lab Results  Component Value Date   WBC 8.9 03/20/2018   HGB 11.3 (L) 03/20/2018   HCT 34.2 (L) 03/20/2018   MCV 77.9 (L) 03/20/2018   PLT 195 03/20/2018    Patient Active Problem List   Diagnosis Date Noted  . Indication for care in labor and delivery, antepartum 03/20/2018  . Abnormal fetal echocardiogram, affecting care of mother, antepartum, fetus 1 02/17/2018  . Supervision of high risk pregnancy, antepartum 11/06/2017  . Previous cesarean delivery affecting pregnancy 11/06/2017  . Chronic hypertension during pregnancy, antepartum 11/06/2017  . Diabetes mellitus complicating pregnancy, antepartum 11/06/2017  . Obesity affecting pregnancy, antepartum 11/06/2017  . DM (diabetes mellitus), type 2 (HCC) 09/21/2016  . Benign essential HTN 09/21/2016  . Obesity (BMI 30-39.9) 01/08/2013    Assessment / Plan: 32 y.o. G3P2002 at [redacted]w[redacted]d here for IOL due to IUFD.   Labor: Largely unchanged, laminaria fell out earlier this am. Placed vaginal Cytotec. Will attempt FB at next check, unable to place one currently.  Fetal Wellbeing:  N/A Pain Control:  Well controlled. Anticipated MOD:  NSVD  Leticia Penna,  D.O. Family Medicine PGY-1  03/21/2018, 12:33 PM

## 2018-03-21 NOTE — Progress Notes (Signed)
Patient Vitals for the past 4 hrs:  BP Pulse Resp  03/20/18 2309 133/83 (!) 108 -  03/20/18 2205 (!) 156/101 (!) 117 20  03/20/18 2203 (!) 156/101 - -   Blood sugar 157 at 2300. Lantus held. Laminaria still in cervix, doesn't feel swollen.  cytotec 25 mcg placed in post vaginal fornix.  Will attempt foley when laminaria expands.

## 2018-03-21 NOTE — Progress Notes (Signed)
LABOR PROGRESS NOTE  Joyce Green is a 32 y.o. G3P2002 at [redacted]w[redacted]d  admitted for IOL due to IUFD.   Subjective: Doing well, feeling more intense contractions more frequently, but rates them still mild.   Objective: BP 132/73   Pulse (!) 107   Temp 98.2 F (36.8 C) (Oral)   Resp 20   Ht 5\' 10"  (1.778 m)   LMP 08/23/2017   SpO2 99%   BMI 36.34 kg/m  or  Vitals:   03/21/18 1222 03/21/18 1404 03/21/18 1500 03/21/18 1538  BP: 135/80   132/73  Pulse: (!) 106   (!) 107  Resp: 18 20 18 20   Temp:    98.2 F (36.8 C)  TempSrc:    Oral  SpO2:      Height:         Dilation: Fingertip Effacement (%): Thick Station: Ballotable Presentation: Undeterminable Exam by:: Kooistra, CNM and Dr. Annia Friendly FHT: N/A Toco: Irregular, infrequent   Labs: Lab Results  Component Value Date   WBC 8.9 03/20/2018   HGB 11.3 (L) 03/20/2018   HCT 34.2 (L) 03/20/2018   MCV 77.9 (L) 03/20/2018   PLT 195 03/20/2018    Patient Active Problem List   Diagnosis Date Noted  . Indication for care in labor and delivery, antepartum 03/20/2018  . Abnormal fetal echocardiogram, affecting care of mother, antepartum, fetus 1 02/17/2018  . Supervision of high risk pregnancy, antepartum 11/06/2017  . Previous cesarean delivery affecting pregnancy 11/06/2017  . Chronic hypertension during pregnancy, antepartum 11/06/2017  . Diabetes mellitus complicating pregnancy, antepartum 11/06/2017  . Obesity affecting pregnancy, antepartum 11/06/2017  . DM (diabetes mellitus), type 2 (HCC) 09/21/2016  . Benign essential HTN 09/21/2016  . Obesity (BMI 30-39.9) 01/08/2013    Assessment / Plan: 32 y.o. G3P2002 at [redacted]w[redacted]d here for IOL for IUFD.   Labor: IOL, start another cytotec-placed buccal. Serial cervical exams, consideration for placing a FB on next check if possible.  Fetal Wellbeing:  N/A Pain Control:  Well controlled  Anticipated MOD:  NSVD  T2DM: Stable, controlled. Last CBG 115.   -Cont SS   -Monitor CBGs  q4   Leticia Penna, D.O. Family Medicine PGY-1  03/21/2018, 4:03 PM

## 2018-03-21 NOTE — Progress Notes (Signed)
I offered support to [place], Christen Bame and Leilany's mom and brother this morning.  They are still trying to wrap their heads and hearts around all that is happening.  As of this morning, they had not selected a name for the baby because until yesterday, they didn't know that baby is a girl.  They preferred the privacy of family and just focusing on the task at hand, labor and delivering their baby.  I have asked Chaplain Darcus Pester to check in on them tomorrow, but Amayiah is aware of our ongoing support earlier if that is needed.  She will let staff know if this is what she wants.    The family have a 32 year old, Dia Sitter, and a 1 month old son, named after Dad.  Chaplain Theda Belfast talked with them some about how to approach this with their children, but we are glad to talk further with them after delivery or at any point.  Chaplain Dyanne Carrel, Bcc Pager,. [phone] 2:51 PM     03/21/18 1400  Clinical Encounter Type  Visited With Patient and family together  Visit Type Spiritual support  Referral From Chaplain;Nurse

## 2018-03-21 NOTE — Progress Notes (Addendum)
   Joyce Green is a 32 y.o. G3P2002 at [redacted]w[redacted]d  admitted for IOL for IUFD. Pregnancy complicated by Type 2 DM and chronic hypertension.   Subjective: Tearful but talkative.   Objective: Vitals:   03/21/18 0904 03/21/18 1015 03/21/18 1125 03/21/18 1222  BP:  (!) 149/97 126/83 135/80  Pulse:  (!) 112 (!) 110 (!) 106  Resp: 18 18 20 18   Temp:  97.9 F (36.6 C)    TempSrc:  Oral    SpO2:      Height:       No intake/output data recorded.   SVE:   Dilation: Fingertip Effacement (%): Thick Station: Ballotable Exam by:: Crisoforo Oxford, CNM and Dr. Annia Friendly   Labs: Lab Results  Component Value Date   WBC 8.9 03/20/2018   HGB 11.3 (L) 03/20/2018   HCT 34.2 (L) 03/20/2018   MCV 77.9 (L) 03/20/2018   PLT 195 03/20/2018    Assessment / Plan:  Will continue with cytotec 25 mcg; patient fingertip and very posterior.  Labor: early labor  Pain Control:  Labor support without medications Anticipated MOD:  NSVD  Joyce Green 03/21/2018, 1:53 PM

## 2018-03-21 NOTE — Progress Notes (Signed)
LABOR PROGRESS NOTE  Joyce Green is a 32 y.o. G3P2002 at [redacted]w[redacted]d  admitted for IOL for IUFD  Subjective: Patient doing okay, not feeling any pain at this moment. In and out of tears but has a lot of support in the room   Objective: BP (!) 149/93   Pulse (!) 114   Temp 98.2 F (36.8 C) (Oral)   Resp 20   Ht 5\' 10"  (1.778 m)   LMP 08/23/2017   SpO2 99%   BMI 36.34 kg/m  or  Vitals:   03/21/18 1702 03/21/18 1703 03/21/18 1835 03/21/18 1836  BP: (!) 143/101 (!) 148/96 (!) 166/100 (!) 149/93  Pulse: (!) 111 (!) 111 (!) 115 (!) 114  Resp:  18  20  Temp:  98.2 F (36.8 C)    TempSrc:  Oral    SpO2:      Height:        FB placed @1830  Dilation: 1 Effacement (%): Thick Station: -3 Presentation: Vertex Exam by:: Lynnell Grain, CNM  Labs: Lab Results  Component Value Date   WBC 8.9 03/20/2018   HGB 11.3 (L) 03/20/2018   HCT 34.2 (L) 03/20/2018   MCV 77.9 (L) 03/20/2018   PLT 195 03/20/2018    Patient Active Problem List   Diagnosis Date Noted  . Indication for care in labor and delivery, antepartum 03/20/2018  . Abnormal fetal echocardiogram, affecting care of mother, antepartum, fetus 1 02/17/2018  . Supervision of high risk pregnancy, antepartum 11/06/2017  . Previous cesarean delivery affecting pregnancy 11/06/2017  . Chronic hypertension during pregnancy, antepartum 11/06/2017  . Diabetes mellitus complicating pregnancy, antepartum 11/06/2017  . Obesity affecting pregnancy, antepartum 11/06/2017  . DM (diabetes mellitus), type 2 (HCC) 09/21/2016  . Benign essential HTN 09/21/2016  . Obesity (BMI 30-39.9) 01/08/2013    Assessment / Plan: 32 y.o. G3P2002 at [redacted]w[redacted]d here for IOL for IUFD  Labor: FB placed, continue low dose cytotec until FB out  Pain Control:  IV Fentanyl  Hypertension: PEC labs ordered due to severe BP of 166/100   Sharyon Cable, CNM 03/21/2018, 7:41 PM

## 2018-03-21 NOTE — Anesthesia Preprocedure Evaluation (Signed)
Anesthesia Evaluation  Patient identified by MRN, date of birth, ID band Patient awake    Reviewed: Allergy & Precautions, H&P , NPO status , Patient's Chart, lab work & pertinent test results  History of Anesthesia Complications Negative for: history of anesthetic complications  Airway Mallampati: II  TM Distance: >3 FB Neck ROM: full    Dental no notable dental hx.    Pulmonary neg pulmonary ROS, former smoker,    Pulmonary exam normal        Cardiovascular hypertension, Normal cardiovascular exam Rhythm:regular Rate:Normal     Neuro/Psych negative neurological ROS  negative psych ROS   GI/Hepatic negative GI ROS, Neg liver ROS,   Endo/Other  diabetes  Renal/GU negative Renal ROS  negative genitourinary   Musculoskeletal   Abdominal   Peds  Hematology negative hematology ROS (+)   Anesthesia Other Findings   Reproductive/Obstetrics (+) Pregnancy                             Anesthesia Physical Anesthesia Plan  ASA: III  Anesthesia Plan: Epidural   Post-op Pain Management:    Induction:   PONV Risk Score and Plan:   Airway Management Planned:   Additional Equipment:   Intra-op Plan:   Post-operative Plan:   Informed Consent: I have reviewed the patients History and Physical, chart, labs and discussed the procedure including the risks, benefits and alternatives for the proposed anesthesia with the patient or authorized representative who has indicated his/her understanding and acceptance.     Plan Discussed with:   Anesthesia Plan Comments:         Anesthesia Quick Evaluation

## 2018-03-21 NOTE — Progress Notes (Signed)
Inpatient Diabetes Program Recommendations  AACE/ADA: New Consensus Statement on Inpatient Glycemic Control (2015)  Target Ranges:  Prepandial:   less than 140 mg/dL      Peak postprandial:   less than 180 mg/dL (1-2 hours)      Critically ill patients:  140 - 180 mg/dL   Lab Results  Component Value Date   GLUCAP 84 03/21/2018   HGBA1C 6.3 (H) 02/05/2018    Review of Glycemic Control Results for Joyce Green, Joyce Green (MRN [id number]) as of 03/21/2018 10:20  Ref. Range 03/21/2018 01:36 03/21/2018 06:03 03/21/2018 10:13  Glucose-Capillary Latest Ref Range: 70 - 99 mg/dL 65 (L) 782 (H) 84   Diabetes history: Type 2 DM Outpatient Diabetes medications: Lantus 13 units QHS, Metformin 1000 mg BID Current orders for Inpatient glycemic control: Lantus 13 units QHS, Metformin 1000 mg BID, Novolog 0-14 units Q4H  Inpatient Diabetes Program Recommendations:    Noted patient IOL for IUFD and has diabetic pregnant patient order set. Patient experienced mild hypoglycemic episode of 65 mg/dL, following Novolog 6 units administered at 2128 on 10/10.   At this time would recommend switching to Glycemic control order set, Novolog 0-9 units AC + HS and decrease Lantus to 8 units QHS.  Will continue to follow.   Thanks, Lujean Rave, MSN, RNC-OB Diabetes Coordinator [phone] (8a-5p)

## 2018-03-22 DIAGNOSIS — O364XX0 Maternal care for intrauterine death, not applicable or unspecified: Secondary | ICD-10-CM

## 2018-03-22 DIAGNOSIS — Z3A33 33 weeks gestation of pregnancy: Secondary | ICD-10-CM

## 2018-03-22 LAB — CBC
HEMATOCRIT: 35.3 % — AB (ref 36.0–46.0)
Hemoglobin: 11.8 g/dL — ABNORMAL LOW (ref 12.0–15.0)
MCH: 26.2 pg (ref 26.0–34.0)
MCHC: 33.4 g/dL (ref 30.0–36.0)
MCV: 78.3 fL — ABNORMAL LOW (ref 80.0–100.0)
Platelets: 175 10*3/uL (ref 150–400)
RBC: 4.51 MIL/uL (ref 3.87–5.11)
RDW: 14.3 % (ref 11.5–15.5)
WBC: 17.8 10*3/uL — ABNORMAL HIGH (ref 4.0–10.5)
nRBC: 0 % (ref 0.0–0.2)

## 2018-03-22 LAB — PROTEIN / CREATININE RATIO, URINE
Creatinine, Urine: 177 mg/dL
Protein Creatinine Ratio: 0.11 mg/mg{Cre} (ref 0.00–0.15)
Total Protein, Urine: 20 mg/dL

## 2018-03-22 LAB — GLUCOSE, CAPILLARY
Glucose-Capillary: 162 mg/dL — ABNORMAL HIGH (ref 70–99)
Glucose-Capillary: 103 mg/dL — ABNORMAL HIGH (ref 70–99)
Glucose-Capillary: 171 mg/dL — ABNORMAL HIGH (ref 70–99)
Glucose-Capillary: 151 mg/dL — ABNORMAL HIGH (ref 70–99)

## 2018-03-22 MED ORDER — OXYTOCIN 10 UNIT/ML IJ SOLN
10.0000 [IU] | Freq: Once | INTRAMUSCULAR | Status: AC
  Administered 2018-03-22: 10 [IU] via INTRAMUSCULAR

## 2018-03-22 MED ORDER — DIPHENHYDRAMINE HCL 25 MG PO CAPS: 25.0000 mg | ORAL_CAPSULE | Freq: Four times a day (QID) | ORAL | Status: DC | PRN

## 2018-03-22 MED ORDER — PRENATAL MULTIVITAMIN CH: 1.0000 | ORAL_TABLET | Freq: Every day | ORAL | Status: DC

## 2018-03-22 MED ORDER — IBUPROFEN 600 MG PO TABS
600.0000 mg | ORAL_TABLET | Freq: Four times a day (QID) | ORAL | Status: DC
  Administered 2018-03-22 – 2018-03-23 (×4): 600 mg via ORAL
  Filled 2018-03-22 (×2): qty 1

## 2018-03-22 MED ORDER — PROMETHAZINE HCL 25 MG/ML IJ SOLN
12.5000 mg | INTRAMUSCULAR | Status: DC | PRN
  Administered 2018-03-22: 12.5 mg via INTRAVENOUS
  Filled 2018-03-22: qty 1

## 2018-03-22 MED ORDER — ONDANSETRON HCL 4 MG/2ML IJ SOLN: 4.0000 mg | INTRAMUSCULAR | Status: DC | PRN

## 2018-03-22 MED ORDER — DIBUCAINE 1 % RE OINT: 1.0000 "application " | TOPICAL_OINTMENT | RECTAL | Status: DC | PRN

## 2018-03-22 MED ORDER — ENALAPRIL MALEATE 10 MG PO TABS
10.0000 mg | ORAL_TABLET | Freq: Every day | ORAL | Status: DC
  Administered 2018-03-23: 10 mg via ORAL
  Filled 2018-03-22 (×2): qty 1

## 2018-03-22 MED ORDER — TETANUS-DIPHTH-ACELL PERTUSSIS 5-2.5-18.5 LF-MCG/0.5 IM SUSP: 0.5000 mL | Freq: Once | INTRAMUSCULAR | Status: DC

## 2018-03-22 MED ORDER — OXYTOCIN 10 UNIT/ML IJ SOLN
INTRAMUSCULAR | Status: AC
  Filled 2018-03-22: qty 1

## 2018-03-22 MED ORDER — INSULIN GLARGINE 100 UNIT/ML ~~LOC~~ SOLN
8.0000 [IU] | Freq: Every day | SUBCUTANEOUS | Status: DC
  Administered 2018-03-22: 8 [IU] via SUBCUTANEOUS
  Filled 2018-03-22 (×2): qty 0.08

## 2018-03-22 MED ORDER — SENNOSIDES-DOCUSATE SODIUM 8.6-50 MG PO TABS
2.0000 | ORAL_TABLET | ORAL | Status: DC
  Administered 2018-03-22: 2 via ORAL
  Filled 2018-03-22: qty 2

## 2018-03-22 MED ORDER — SIMETHICONE 80 MG PO CHEW: 80.0000 mg | CHEWABLE_TABLET | ORAL | Status: DC | PRN

## 2018-03-22 MED ORDER — ONDANSETRON HCL 4 MG PO TABS: 4.0000 mg | ORAL_TABLET | ORAL | Status: DC | PRN

## 2018-03-22 MED ORDER — OXYTOCIN 40 UNITS IN LACTATED RINGERS INFUSION - SIMPLE MED
1.0000 m[IU]/min | INTRAVENOUS | Status: DC
  Administered 2018-03-22: 2 m[IU]/min via INTRAVENOUS
  Filled 2018-03-22: qty 1000

## 2018-03-22 MED ORDER — BENZOCAINE-MENTHOL 20-0.5 % EX AERO
1.0000 "application " | INHALATION_SPRAY | CUTANEOUS | Status: DC | PRN
  Administered 2018-03-22: 1 via TOPICAL
  Filled 2018-03-22 (×2): qty 56

## 2018-03-22 MED ORDER — WITCH HAZEL-GLYCERIN EX PADS: 1.0000 "application " | MEDICATED_PAD | CUTANEOUS | Status: DC | PRN

## 2018-03-22 MED ORDER — ZOLPIDEM TARTRATE 5 MG PO TABS
5.0000 mg | ORAL_TABLET | Freq: Every evening | ORAL | Status: DC | PRN
  Administered 2018-03-22: 5 mg via ORAL
  Filled 2018-03-22: qty 1

## 2018-03-22 MED ORDER — MEASLES, MUMPS & RUBELLA VAC ~~LOC~~ INJ
0.5000 mL | INJECTION | Freq: Once | SUBCUTANEOUS | Status: DC
  Filled 2018-03-22: qty 0.5

## 2018-03-22 MED ORDER — ACETAMINOPHEN 325 MG PO TABS: 650.0000 mg | ORAL_TABLET | ORAL | Status: DC | PRN

## 2018-03-22 MED ORDER — COCONUT OIL OIL: 1.0000 "application " | TOPICAL_OIL | Status: DC | PRN

## 2018-03-22 MED ORDER — METFORMIN HCL 500 MG PO TABS
1000.0000 mg | ORAL_TABLET | Freq: Two times a day (BID) | ORAL | Status: DC
  Administered 2018-03-23: 1000 mg via ORAL
  Filled 2018-03-22 (×3): qty 2

## 2018-03-22 NOTE — Progress Notes (Signed)
OB/GYN Faculty Practice: Labor Progress Note  Subjective: Here with IUFD. Epidural in place, contracting now with pitocin. Relatively comfortable, husband in room.   Objective: BP 118/74   Pulse (!) 102   Temp 98.2 F (36.8 C) (Oral)   Resp 16   Ht 5\' 10"  (1.778 m)   Wt 114.8 kg   LMP 08/23/2017   SpO2 98%   BMI 36.31 kg/m  Gen: tearful, comfortable  Dilation: 5.5 Effacement (%): 60 Cervical Position: Posterior Station: -3 Presentation: Vertex Exam by:: S Nix RN  Assessment and Plan: 32 y.o. [id number] [redacted]w[redacted]d here with induction of labor for IUFD. U/S performed 10/10 confirmed IUFD.   Induction of Labor IUFD  History of Prior C/S x 2: s/p FB, cytotec.  -- s/p AROM 1030  -- continue to titrate pitocin, IUPC placed at 1030  -- pain control: epidural in place -- PPH Risk: moderate (obesity)  Fetal Status: IUFD - pregnancy complicated by previously narrowed ductus arteriosus, Type II DM, cHTN. Cephalic by sutures. EFW 2380g (85% at 32w1).   Type II DM: On insulin (Lantus 13U qHS,) and metformin 1000mg  BID.  -- continue SSI with ACHS checks (on clears because of epidural/pitocin)  CHTN: Well-controlled in pregnancy on labetalol 200mg  BID.  -- continue ASA postpartum   Naif Alabi S. Earlene Plater, DO OB/GYN Fellow, Faculty Practice  10:29 AM

## 2018-03-22 NOTE — Progress Notes (Signed)
Pericare given and pt repsoitioned

## 2018-03-22 NOTE — Progress Notes (Signed)
Pt was lying in bed resting when I arrived. We had a brief visit. She said her husband was next door w/a list of funeral homes and she also wanted me to check on him. She seemed also be concerned about him. She said she was doing okay.  Her husband said he was okay and going through the list. He said he did not have any questions presently. I encouraged him to have the nurse page me if he needs assistance. Chaplain Marjory Lies, MDiv.   03/22/18 1600  Clinical Encounter Type  Visited With Patient;Family

## 2018-03-22 NOTE — Progress Notes (Signed)
OB/GYN Faculty Practice: Labor Progress Note  Subjective: Patient is complete, can feel contractions. Not much urge to push. Husband is on his way back with their children. Would like to wait to start pushing until he returns.   Objective: BP (!) 146/93   Pulse (!) 111   Temp 98.3 F (36.8 C) (Oral)   Resp 16   Ht 5\' 10"  (1.778 m)   Wt 114.8 kg   LMP 08/23/2017   SpO2 98%   BMI 36.31 kg/m  Gen: NAD, somewhat tearful  Dilation: 10 Dilation Complete Time: 1734 Effacement (%): 100 Cervical Position: Posterior Station: Plus 2 Presentation: Undeterminable Exam by:: s grindstaff rn   Assessment and Plan: 32 y.o. [id number] [redacted]w[redacted]d here with induction of labor for IUFD. U/S performed 10/10 confirmed IUFD.   Induction of Labor IUFD  History of Prior C/S x 2: Stage II. Will start pushing once husband returns. -- s/p AROM 1030  -- continue pitocin, IUPC placed at 1030  -- pain control: epidural in place -- PPH Risk: moderate (obesity)  Fetal Status: IUFD - pregnancy complicated by previously narrowed ductus arteriosus, Type II DM, cHTN. Cephalic by sutures. EFW 2380g (85% at 32w1). Baby's name is Joyce Green, mother would like to hold baby at time of delivery.   Type II DM: On insulin (Lantus 13U qHS,) and metformin 1000mg  BID. A1C at beginning of prenatal care was 9.7%.  -- continue SSI with ACHS checks (on clears because of epidural/pitocin) - continue sliding scale coverage at this time -- will plan to check ACHS postpartum and  -- will restart metformin postpartum, likely Lantus 6U (1/2 pregnancy dose) as well  CHTN: Well-controlled in pregnancy on labetalol 200mg  BID. BP has been in moderate range here.  -- continue ASA postpartum  -- will plan to restart antihypertensive postpartum - previously on enalapril 5-10mg  daily   Rosella Crandell S. Earlene Plater, DO OB/GYN Fellow, Faculty Practice  5:51 PM

## 2018-03-22 NOTE — Progress Notes (Signed)
Patient ID: Joyce Green, female   DOB: Nov 23, 1985, 32 y.o.   MRN: [id number] Has not slept well on the Ambien 5mg  Wants something to help her rest  . Vitals:   03/22/18 0301 03/22/18 0331 03/22/18 0401 03/22/18 0431  BP: (!) 136/93 (!) 130/93 (!) 135/91 (!) 140/99  Pulse: 100 (!) 106 (!) 104 (!) 106  Resp:  16 16 18   Temp:    97.6 F (36.4 C)  TempSrc:    Oral  SpO2:      Weight:      Height:       Will give a half dose of Promethazine for now

## 2018-03-22 NOTE — Progress Notes (Signed)
Patient ID: Joyce Green, female   DOB: 1985/12/23, 32 y.o.   MRN: [id number] Doing well, comfortable  AVSS  UCs not being traced Foley in place  Still getting Cytotec  Dilation: 3 Effacement (%): 70 Station: -3 Presentation: Vertex Exam by:: L.Stubbs, RN  Continue plan of care

## 2018-03-22 NOTE — Progress Notes (Signed)
Pt tearfull at delivery.  Father comforting mother at delivery.  Father cut cord

## 2018-03-23 ENCOUNTER — Encounter (HOSPITAL_COMMUNITY): Payer: Self-pay

## 2018-03-23 DIAGNOSIS — O364XX0 Maternal care for intrauterine death, not applicable or unspecified: Secondary | ICD-10-CM | POA: Diagnosis present

## 2018-03-23 LAB — GLUCOSE, CAPILLARY
GLUCOSE-CAPILLARY: 77 mg/dL (ref 70–99)
Glucose-Capillary: 108 mg/dL — ABNORMAL HIGH (ref 70–99)

## 2018-03-23 MED ORDER — IBUPROFEN 600 MG PO TABS: 600.0000 mg | ORAL_TABLET | Freq: Four times a day (QID) | ORAL | 1 refills | Status: DC | PRN

## 2018-03-23 MED ORDER — METFORMIN HCL 1000 MG PO TABS: 1000.0000 mg | ORAL_TABLET | Freq: Two times a day (BID) | ORAL | 3 refills | Status: DC

## 2018-03-23 MED ORDER — ENALAPRIL MALEATE 10 MG PO TABS: 10.0000 mg | ORAL_TABLET | Freq: Every day | ORAL | 3 refills | Status: DC

## 2018-03-23 MED ORDER — INSULIN GLARGINE 100 UNIT/ML ~~LOC~~ SOLN: 8.0000 [IU] | Freq: Every day | SUBCUTANEOUS | 11 refills | Status: DC

## 2018-03-23 NOTE — Anesthesia Postprocedure Evaluation (Signed)
Anesthesia Post Note  Patient: Joyce Green  Procedure(s) Performed: AN AD HOC LABOR EPIDURAL     Patient location during evaluation: Mother Baby Anesthesia Type: Epidural Level of consciousness: awake and alert and oriented Pain management: satisfactory to patient Vital Signs Assessment: post-procedure vital signs reviewed and stable Respiratory status: respiratory function stable Cardiovascular status: stable Postop Assessment: no headache, no backache, epidural receding, patient able to bend at knees, no signs of nausea or vomiting and adequate PO intake Anesthetic complications: no    Last Vitals:  Vitals:   03/23/18 0403 03/23/18 0855  BP: 133/85 (!) 154/107  Pulse: 99 (!) 102  Resp: 18 18  Temp: 36.8 C 36.9 C  SpO2: 99% 100%    Last Pain:  Vitals:   03/23/18 0859  TempSrc:   PainSc: 0-No pain   Pain Goal: Patients Stated Pain Goal: 2 (03/23/18 0410)               Karleen Dolphin

## 2018-03-23 NOTE — Progress Notes (Signed)
Pt was reclined in bed when I arrived. Her husband and two other visitors were bedside. She said she was doing ok and her husband said he was do great. They did not have any questions at the time or the need for further assistance. Please page if additional support is needed. Chaplain Marjory Lies, MDiv   03/23/18 1400  Clinical Encounter Type  Visited With Patient and family together

## 2018-03-23 NOTE — Progress Notes (Signed)
Sample of cord tissue and sample of fetal tissue from heel taken for Anora sample.   Baldemar Lenis, M.D. [facility] [employer]

## 2018-03-23 NOTE — Discharge Summary (Signed)
Physician Discharge Summary  Patient ID: Joyce Green MRN: [id number] DOB/AGE: 32-Jan-1987 32 y.o.  Admit date: 03/20/2018 Discharge date: 03/23/2018   Discharge Diagnoses:  Principal Problem:   IUFD at 66 weeks or more of gestation Active Problems:   Obesity (BMI 30-39.9)   Benign essential HTN   Supervision of high risk pregnancy, antepartum   Previous cesarean delivery affecting pregnancy   Chronic hypertension during pregnancy, antepartum   Diabetes mellitus complicating pregnancy, antepartum   Obesity affecting pregnancy, antepartum   Abnormal fetal echocardiogram, affecting care of mother, antepartum, fetus 1   Indication for care in labor and delivery, antepartum   Hospital Course: Please see HPI dated 03/20/2018 for details. This is a 32 y.o. [id number] now PPD#1 from a vaginal birth after c-section. She presented for for induction of labor for an IUFD on 03/20/18 at 14 weeks. Her antepartum course was complicated by T8UE and cHTN. She had borderline polyhydramnios and concern for a narrow ductus arteriosis which normalized on subsequent fetal echo.   Her post partum course was uncomplicated. By day 1, she was ambulating, passing flatus and pain was well controlled. Her bleeding is minimal and she reports feeling as well as she could. Denies nausea/vomiting. Her husband is in the room with her and appears to give her good support.  She was discharged home HD#4 in fair condition. BP mildly elevated and CBGs mildly elevated. She was sent home on pre-pregnancy regimen for HTN and T2DM and will need to follow up with her primary care physician. She does opt for autopsy and microarray, fetus will be discharged to funeral home per patient wishes after autopsy. She has been seen by the chaplain.  Instructions for follow up given.    Physical exam  Vitals:   03/22/18 2351 03/23/18 0403 03/23/18 0855 03/23/18 1450  BP: 136/80 133/85 (!) 154/107 (!) 133/94  Pulse: (!) 109 99 (!)  102 87  Resp: _0 Temp: 98.4 F (36.9 C) 98.2 F (36.8 C) 98.5 F (36.9 C)   TempSrc: Oral Oral Oral   SpO2: 99% 99% 100%   Weight:      Height:       Physical Exam:  General: alert, oriented, cooperative Chest: normal respiratory effort Heart: RRR  Abdomen: soft, appropriately tender to palpation  Uterine Fundus: firm, 2 fingers below the umbilicus Lochia: moderate, rubra DVT Evaluation: no evidence of DVT Extremities: no edema, no calf tenderness   Postpartum contraception: IUD undecided  Disposition: home  Discharged Condition: fair  Discharge Instructions    Call MD for:  difficulty breathing, headache or visual disturbances   Complete by:  As directed    Call MD for:  extreme fatigue   Complete by:  As directed    Call MD for:  hives   Complete by:  As directed    Call MD for:  persistant dizziness or light-headedness   Complete by:  As directed    Call MD for:  persistant nausea and vomiting   Complete by:  As directed    Call MD for:  redness, tenderness, or signs of infection (pain, swelling, redness, odor or green/yellow discharge around incision site)   Complete by:  As directed    Call MD for:  severe uncontrolled pain   Complete by:  As directed    Call MD for:  temperature >100.4   Complete by:  As directed    Diet - low sodium heart healthy   Complete by:  As directed      Allergies as of 03/23/2018   No Known Allergies     Medication List    STOP taking these medications   aspirin EC 81 MG tablet   cyclobenzaprine 10 MG tablet Commonly known as:  FLEXERIL   Insulin Glargine 100 UNIT/ML Solostar Pen Commonly known as:  LANTUS Replaced by:  insulin glargine 100 UNIT/ML injection   labetalol 200 MG tablet Commonly known as:  NORMODYNE   PRENATAL COMPLETE PO     TAKE these medications   ACCU-CHEK FASTCLIX LANCETS Misc 1 Units by Percutaneous route 4 (four) times daily. What changed:  Another medication with the same name was  removed. Continue taking this medication, and follow the directions you see here.   ACCU-CHEK NANO SMARTVIEW w/Device Kit 1 kit by Subdermal route as directed. Check blood sugars for fasting, and two hours after breakfast, lunch and dinner (4 checks daily)   enalapril 10 MG tablet Commonly known as:  VASOTEC Take 1 tablet (10 mg total) by mouth daily. Start taking on:  03/24/2018   glucose blood test strip Use as instructed to check blood sugars What changed:  Another medication with the same name was removed. Continue taking this medication, and follow the directions you see here.   ibuprofen 600 MG tablet Commonly known as:  ADVIL,MOTRIN Take 1 tablet (600 mg total) by mouth every 6 (six) hours as needed.   insulin glargine 100 UNIT/ML injection Commonly known as:  LANTUS Inject 0.08 mLs (8 Units total) into the skin at bedtime. Replaces:  Insulin Glargine 100 UNIT/ML Solostar Pen   metFORMIN 1000 MG tablet Commonly known as:  GLUCOPHAGE Take 1 tablet (1,000 mg total) by mouth 2 (two) times daily with a meal.      Follow-up Information    Center for [facility]. Schedule an appointment as soon as possible for a visit in 2 week(s).   Specialty:  Obstetrics and Gynecology Contact information: [address] [place] [phone]          Signed: Feliz Beam, M.D. [facility]  03/23/2018, 4:28 PM

## 2018-03-24 LAB — TORCH-IGM(TOXO/ RUB/ CMV/ HSV) W TITER
CMV IgM: <30 AU/mL (ref 0.0–29.9)
HSVI/II Comb IgM: <0.91 Ratio (ref 0.00–0.90)
Rubella IgM: <20 AU/mL (ref 0.0–19.9)
Toxoplasma Antibody- IgM: 4.2 AU/mL (ref 0.0–7.9)

## 2018-03-24 LAB — INFECT DISEASE AB IGM REFLEX 1

## 2018-03-30 ENCOUNTER — Emergency Department (HOSPITAL_COMMUNITY): Payer: Medicaid Other

## 2018-03-30 ENCOUNTER — Inpatient Hospital Stay (HOSPITAL_COMMUNITY)
Admission: EM | Admit: 2018-03-30 | Discharge: 2018-04-03 | DRG: 776 | Disposition: A | Discharge status: Home / Self Care | Payer: Medicaid Other | Attending: Family Medicine | Admitting: Family Medicine

## 2018-03-30 ENCOUNTER — Other Ambulatory Visit: Payer: Self-pay

## 2018-03-30 ENCOUNTER — Encounter (HOSPITAL_COMMUNITY): Payer: Self-pay | Admitting: *Deleted

## 2018-03-30 DIAGNOSIS — B9620 Unspecified Escherichia coli [E. coli] as the cause of diseases classified elsewhere: Secondary | ICD-10-CM | POA: Diagnosis present

## 2018-03-30 DIAGNOSIS — Z87891 Personal history of nicotine dependence: Secondary | ICD-10-CM | POA: Diagnosis not present

## 2018-03-30 DIAGNOSIS — O8620 Urinary tract infection following delivery, unspecified: Secondary | ICD-10-CM | POA: Diagnosis not present

## 2018-03-30 DIAGNOSIS — E119 Type 2 diabetes mellitus without complications: Secondary | ICD-10-CM | POA: Diagnosis not present

## 2018-03-30 DIAGNOSIS — R7881 Bacteremia: Secondary | ICD-10-CM

## 2018-03-30 DIAGNOSIS — O864 Pyrexia of unknown origin following delivery: Secondary | ICD-10-CM | POA: Diagnosis present

## 2018-03-30 DIAGNOSIS — R509 Fever, unspecified: Secondary | ICD-10-CM

## 2018-03-30 DIAGNOSIS — R8281 Pyuria: Secondary | ICD-10-CM

## 2018-03-30 DIAGNOSIS — O1003 Pre-existing essential hypertension complicating the puerperium: Secondary | ICD-10-CM | POA: Diagnosis not present

## 2018-03-30 DIAGNOSIS — O2413 Pre-existing diabetes mellitus, type 2, in the puerperium: Secondary | ICD-10-CM | POA: Diagnosis not present

## 2018-03-30 DIAGNOSIS — A419 Sepsis, unspecified organism: Secondary | ICD-10-CM

## 2018-03-30 LAB — URINALYSIS, ROUTINE W REFLEX MICROSCOPIC
BILIRUBIN URINE: NEGATIVE
Bilirubin Urine: NEGATIVE
GLUCOSE, UA: 50 mg/dL — AB
GLUCOSE, UA: 50 mg/dL — AB
KETONES UR: NEGATIVE mg/dL
Ketones, ur: NEGATIVE mg/dL
NITRITE: NEGATIVE
NITRITE: POSITIVE — AB
PH: 6 (ref 5.0–8.0)
PROTEIN: 100 mg/dL — AB
Protein, ur: 30 mg/dL — AB
SPECIFIC GRAVITY, URINE: 1.008 (ref 1.005–1.030)
Specific Gravity, Urine: 1.011 (ref 1.005–1.030)
WBC, UA: >50 WBC/hpf — ABNORMAL HIGH (ref 0–5)
pH: 6 (ref 5.0–8.0)

## 2018-03-30 LAB — COMPREHENSIVE METABOLIC PANEL
ALBUMIN: 2.7 g/dL — AB (ref 3.5–5.0)
ALK PHOS: 71 U/L (ref 38–126)
ALT: 11 U/L (ref 0–44)
ANION GAP: 9 (ref 5–15)
AST: 12 U/L — ABNORMAL LOW (ref 15–41)
BILIRUBIN TOTAL: 0.5 mg/dL (ref 0.3–1.2)
BUN: 13 mg/dL (ref 6–20)
CO2: 19 mmol/L — ABNORMAL LOW (ref 22–32)
Calcium: 8.4 mg/dL — ABNORMAL LOW (ref 8.9–10.3)
Chloride: 110 mmol/L (ref 98–111)
Creatinine, Ser: 0.7 mg/dL (ref 0.44–1.00)
GFR calc non Af Amer: >60 mL/min (ref >60)
Glucose, Bld: 248 mg/dL — ABNORMAL HIGH (ref 70–99)
POTASSIUM: 3.3 mmol/L — AB (ref 3.5–5.1)
Sodium: 138 mmol/L (ref 135–145)
TOTAL PROTEIN: 6.9 g/dL (ref 6.5–8.1)

## 2018-03-30 LAB — I-STAT CG4 LACTIC ACID, ED
LACTIC ACID, VENOUS: 0.98 mmol/L (ref 0.5–1.9)
LACTIC ACID, VENOUS: 0.85 mmol/L (ref 0.5–1.9)

## 2018-03-30 LAB — BLOOD GAS, VENOUS
Acid-base deficit: 2.6 mmol/L — ABNORMAL HIGH (ref 0.0–2.0)
BICARBONATE: 19.7 mmol/L — AB (ref 20.0–28.0)
FIO2: 21
O2 SAT: 96.8 %
PATIENT TEMPERATURE: 98.6
PO2 VEN: 87.5 mmHg — AB (ref 32.0–45.0)
pCO2, Ven: 27.5 mmHg — ABNORMAL LOW (ref 44.0–60.0)
pH, Ven: 7.469 — ABNORMAL HIGH (ref 7.250–7.430)

## 2018-03-30 LAB — CBC WITH DIFFERENTIAL/PLATELET
Abs Immature Granulocytes: 0.09 10*3/uL — ABNORMAL HIGH (ref 0.00–0.07)
BASOS ABS: 0 10*3/uL (ref 0.0–0.1)
BASOS PCT: 0 %
EOS ABS: 0 10*3/uL (ref 0.0–0.5)
EOS PCT: 0 %
HCT: 35.7 % — ABNORMAL LOW (ref 36.0–46.0)
HEMOGLOBIN: 11.3 g/dL — AB (ref 12.0–15.0)
Immature Granulocytes: 1 %
Lymphocytes Relative: 5 %
Lymphs Abs: 0.6 10*3/uL — ABNORMAL LOW (ref 0.7–4.0)
MCH: 25.1 pg — AB (ref 26.0–34.0)
MCHC: 31.7 g/dL (ref 30.0–36.0)
MCV: 79.3 fL — ABNORMAL LOW (ref 80.0–100.0)
Monocytes Absolute: 0.5 10*3/uL (ref 0.1–1.0)
Monocytes Relative: 4 %
NRBC: 0 % (ref 0.0–0.2)
Neutro Abs: 10.7 10*3/uL — ABNORMAL HIGH (ref 1.7–7.7)
Neutrophils Relative %: 90 %
PLATELETS: 275 10*3/uL (ref 150–400)
RBC: 4.5 MIL/uL (ref 3.87–5.11)
RDW: 13.4 % (ref 11.5–15.5)
WBC: 12 10*3/uL — AB (ref 4.0–10.5)

## 2018-03-30 LAB — PROTIME-INR
INR: 0.93
Prothrombin Time: 12.4 seconds (ref 11.4–15.2)

## 2018-03-30 MED ORDER — VANCOMYCIN HCL IN DEXTROSE 1-5 GM/200ML-% IV SOLN
1000.0000 mg | Freq: Once | INTRAVENOUS | Status: AC
  Administered 2018-03-30: 1000 mg via INTRAVENOUS
  Filled 2018-03-30: qty 200

## 2018-03-30 MED ORDER — SODIUM CHLORIDE 0.9 % IV BOLUS (SEPSIS)
1000.0000 mL | Freq: Once | INTRAVENOUS | Status: AC
  Administered 2018-03-30: 1000 mL via INTRAVENOUS

## 2018-03-30 MED ORDER — SODIUM CHLORIDE 0.9% FLUSH: 3.0000 mL | INTRAVENOUS | Status: DC | PRN

## 2018-03-30 MED ORDER — SODIUM CHLORIDE 0.9 % IV SOLN
2.0000 g | Freq: Once | INTRAVENOUS | Status: AC
  Administered 2018-03-30: 2 g via INTRAVENOUS
  Filled 2018-03-30: qty 2

## 2018-03-30 MED ORDER — GUAIFENESIN 100 MG/5ML PO SOLN
15.0000 mL | ORAL | Status: DC | PRN
  Filled 2018-03-30: qty 15

## 2018-03-30 MED ORDER — RANITIDINE HCL 150 MG/10ML PO SYRP: 150.0000 mg | ORAL_SOLUTION | Freq: Once | ORAL | Status: DC

## 2018-03-30 MED ORDER — SODIUM CHLORIDE 0.9 % IV SOLN: 250.0000 mL | INTRAVENOUS | Status: DC | PRN

## 2018-03-30 MED ORDER — METRONIDAZOLE IN NACL 5-0.79 MG/ML-% IV SOLN
500.0000 mg | Freq: Three times a day (TID) | INTRAVENOUS | Status: DC
  Administered 2018-03-30: 500 mg via INTRAVENOUS
  Filled 2018-03-30: qty 100

## 2018-03-30 MED ORDER — SODIUM CHLORIDE 0.9 % IV SOLN
2.0000 g | INTRAVENOUS | Status: DC
  Administered 2018-03-31 – 2018-04-02 (×4): 2 g via INTRAVENOUS
  Filled 2018-03-30 (×2): qty 20
  Filled 2018-03-30 (×2): qty 2
  Filled 2018-03-30: qty 20

## 2018-03-30 MED ORDER — MORPHINE SULFATE (PF) 2 MG/ML IV SOLN
2.0000 mg | Freq: Once | INTRAVENOUS | Status: AC
  Administered 2018-03-30: 2 mg via INTRAVENOUS
  Filled 2018-03-30: qty 1

## 2018-03-30 MED ORDER — ACETAMINOPHEN 325 MG PO TABS
650.0000 mg | ORAL_TABLET | Freq: Once | ORAL | Status: AC
  Administered 2018-03-30: 650 mg via ORAL
  Filled 2018-03-30: qty 2

## 2018-03-30 MED ORDER — ALUM & MAG HYDROXIDE-SIMETH 200-200-20 MG/5ML PO SUSP
30.0000 mL | ORAL | Status: DC | PRN
  Filled 2018-03-30: qty 30

## 2018-03-30 MED ORDER — SODIUM CHLORIDE 0.9 % IV BOLUS (SEPSIS)
500.0000 mL | Freq: Once | INTRAVENOUS | Status: AC
  Administered 2018-03-30: 500 mL via INTRAVENOUS

## 2018-03-30 MED ORDER — SODIUM CHLORIDE 0.9 % IV BOLUS: 1000.0000 mL | Freq: Once | INTRAVENOUS | Status: DC

## 2018-03-30 MED ORDER — MENTHOL 3 MG MT LOZG: 1.0000 | LOZENGE | OROMUCOSAL | Status: DC | PRN

## 2018-03-30 MED ORDER — SODIUM CHLORIDE 0.9% FLUSH
3.0000 mL | Freq: Two times a day (BID) | INTRAVENOUS | Status: DC
  Administered 2018-03-31 – 2018-04-02 (×7): 3 mL via INTRAVENOUS

## 2018-03-30 MED ORDER — IBUPROFEN 600 MG PO TABS
600.0000 mg | ORAL_TABLET | Freq: Four times a day (QID) | ORAL | Status: DC | PRN
  Administered 2018-03-31 – 2018-04-01 (×5): 600 mg via ORAL
  Filled 2018-03-30 (×5): qty 1

## 2018-03-30 NOTE — ED Notes (Signed)
Bed: WA16 Expected date:  Expected time:  Means of arrival:  Comments: 

## 2018-03-30 NOTE — ED Provider Notes (Signed)
[place] DEPT Provider Note   CSN: [id number] Arrival date & time: 03/30/18  1844     History   Chief Complaint Chief Complaint  Patient presents with  . Dizziness    HPI Joyce Green is a 32 y.o. female.  HPI  32 year old female G3 P2 1 preterm with iufd at 91 weeks, delivered 10/12 after iufd on 10/10.  Delivery was vaginal with sutures plaed. Patient states she began having chills and diffuse muscle aches yesterday.  Some mild headache.  Denies nasal congestion, sore throat, cough, dyspnea, rash,abdominal pain, urinary frequency, vomiting or diarrhea. She had some nausea today.  She states she has mild burning when she urinates when urine goes over her stitches.  She has not noted any significant change in vaginal discharge.  She states that she is continued to have some mild bleeding.  She is feels that she is not having any problems with the perineal area or stitches.  She is diabetic and has been taking her metformin.  She states her highest blood sugar was 182 with the rest around 100.  She has been on labetalol during the pregnancy and just picked up her blood pressure medicine today.  She has not taken any blood pressure medicine today.  She had her flu shot during her pregnancy.  She has 2 children at home but has no known sick contacts  Past Medical History:  Diagnosis Date  . Diabetes mellitus without complication ([place])    T2  . Hypertension   . Palpitations     Patient Active Problem List   Diagnosis Date Noted  . IUFD at 40 weeks or more of gestation 03/23/2018  . Indication for care in labor and delivery, antepartum 03/20/2018  . Abnormal fetal echocardiogram, affecting care of mother, antepartum, fetus 1 02/17/2018  . Supervision of high risk pregnancy, antepartum 11/06/2017  . Previous cesarean delivery affecting pregnancy 11/06/2017  . Chronic hypertension during pregnancy, antepartum 11/06/2017  . Diabetes mellitus  complicating pregnancy, antepartum 11/06/2017  . Obesity affecting pregnancy, antepartum 11/06/2017  . DM (diabetes mellitus), type 2 ([place]) 09/21/2016  . Benign essential HTN 09/21/2016  . Obesity (BMI 30-39.9) 01/08/2013    Past Surgical History:  Procedure Laterality Date  . CESAREAN SECTION N/A 02/20/2014   Procedure: CESAREAN SECTION;  Surgeon: Florian Buff, MD;  Location: [facility];  Service: Obstetrics;  Laterality: N/A;  . CESAREAN SECTION N/A 07/28/2016   Procedure: CESAREAN SECTION;  Surgeon: Mora Bellman, MD;  Location: [facility];  Service: Obstetrics;  Laterality: N/A;     OB History    Gravida  3   Para  3   Term  2   Preterm  1   AB  0   Living  2     SAB  0   TAB  0   Ectopic  0   Multiple  0   Live Births  2            Home Medications    Prior to Admission medications   Medication Sig Start Date End Date Taking? Authorizing Provider  ACCU-CHEK FASTCLIX LANCETS MISC 1 Units by Percutaneous route 4 (four) times daily. 01/22/18   Caren Macadam, MD  Blood Glucose Monitoring Suppl ([place]) w/Device KIT 1 kit by Subdermal route as directed. Check blood sugars for fasting, and two hours after breakfast, lunch and dinner (4 checks daily) 01/22/18   Caren Macadam, MD  enalapril (VASOTEC) 10 MG  tablet Take 1 tablet (10 mg total) by mouth daily. 03/24/18   Sloan Leiter, MD  glucose blood (ACCU-CHEK SMARTVIEW) test strip Use as instructed to check blood sugars 01/22/18   Caren Macadam, MD  ibuprofen (ADVIL,MOTRIN) 600 MG tablet Take 1 tablet (600 mg total) by mouth every 6 (six) hours as needed. 03/23/18   Sloan Leiter, MD  insulin glargine (LANTUS) 100 UNIT/ML injection Inject 0.08 mLs (8 Units total) into the skin at bedtime. 03/23/18   Sloan Leiter, MD  metFORMIN (GLUCOPHAGE) 1000 MG tablet Take 1 tablet (1,000 mg total) by mouth 2 (two) times daily with a meal. 03/23/18   Sloan Leiter, MD     Family History Family History  Problem Relation Age of Onset  . Diabetes Mellitus I Mother   . Hypertension Mother   . Diabetes Mother   . Hypertension Father   . Cancer Maternal Grandmother   . Cancer Maternal Grandfather   . Cancer Paternal Grandmother   . Cancer Paternal Grandfather   . Early death Neg Hx   . Heart disease Neg Hx   . Hyperlipidemia Neg Hx   . Kidney disease Neg Hx   . Stroke Neg Hx     Social History Social History   Tobacco Use  . Smoking status: Former Smoker    Packs/day: 0.00    Years: 0.00    Pack years: 0.00    Last attempt to quit: 06/11/2009    Years since quitting: 8.8  . Smokeless tobacco: Never Used  . Tobacco comment: not while pregnant  Substance Use Topics  . Alcohol use: Not Currently    Alcohol/week: 4.0 standard drinks    Types: 2 Glasses of wine, 2 Cans of beer per week    Comment: occasional; stopped after found out she was pregnant  . Drug use: No    Comment: per prenatal record, +THC on initial visit u/a; no current use     Allergies   Patient has no known allergies.   Review of Systems Review of Systems  All other systems reviewed and are negative.    Physical Exam Updated Vital Signs BP (!) 190/98   Pulse (!) 160   Temp (!) 102.9 F (39.4 C)   Resp (!) 22   LMP 08/23/2017   SpO2 98%   Physical Exam  Constitutional: She is oriented to person, place, and time. She appears well-developed and well-nourished. No distress.  HENT:  Head: Normocephalic and atraumatic.  Right Ear: External ear normal.  Left Ear: External ear normal.  Nose: Nose normal.  Mouth/Throat: Oropharynx is clear and moist.  Eyes: Pupils are equal, round, and reactive to light. EOM are normal.  Neck: Normal range of motion.  Cardiovascular: Tachycardia present.  Pulmonary/Chest: Effort normal and breath sounds normal.  Abdominal: Soft. Bowel sounds are normal.  Genitourinary: Vagina normal.  Genitourinary Comments: External perineal  exam done Stitches in place with no surrounding erythema, discharge, redness, or tenderness  Musculoskeletal: Normal range of motion. She exhibits no edema or tenderness.  Neurological: She is alert and oriented to person, place, and time.  Skin: Skin is warm and dry. Capillary refill takes less than 2 seconds.  Psychiatric: She has a normal mood and affect.  Nursing note and vitals reviewed.    ED Treatments / Results  Labs (all labs ordered are listed, but only abnormal results are displayed) Labs Reviewed  COMPREHENSIVE METABOLIC PANEL - Abnormal; Notable for the following components:  Result Value   Potassium 3.3 (*)    CO2 19 (*)    Glucose, Bld 248 (*)    Calcium 8.4 (*)    Albumin 2.7 (*)    AST 12 (*)    All other components within normal limits  CBC WITH DIFFERENTIAL/PLATELET - Abnormal; Notable for the following components:   WBC 12.0 (*)    Hemoglobin 11.3 (*)    HCT 35.7 (*)    MCV 79.3 (*)    MCH 25.1 (*)    Neutro Abs 10.7 (*)    Lymphs Abs 0.6 (*)    Abs Immature Granulocytes 0.09 (*)    All other components within normal limits  URINALYSIS, ROUTINE W REFLEX MICROSCOPIC - Abnormal; Notable for the following components:   APPearance CLOUDY (*)    Glucose, UA 50 (*)    Hgb urine dipstick LARGE (*)    Protein, ur 100 (*)    Leukocytes, UA LARGE (*)    WBC, UA >50 (*)    Bacteria, UA RARE (*)    All other components within normal limits  BLOOD GAS, VENOUS - Abnormal; Notable for the following components:   pH, Ven 7.469 (*)    pCO2, Ven 27.5 (*)    pO2, Ven 87.5 (*)    Bicarbonate 19.7 (*)    Acid-base deficit 2.6 (*)    All other components within normal limits  CULTURE, BLOOD (ROUTINE X 2)  CULTURE, BLOOD (ROUTINE X 2)  RESPIRATORY PANEL BY PCR  PROTIME-INR  URINALYSIS, ROUTINE W REFLEX MICROSCOPIC  I-STAT CG4 LACTIC ACID, ED  I-STAT CG4 LACTIC ACID, ED    EKG Unable to read in muse ED ECG REPORT   Date: 03/30/2018  Rate: 152  Rhythm:  sinus tachycardia  QRS Axis: normal  Intervals: normal  ST/T Wave abnormalities: normal  Conduction Disutrbances:none  Narrative Interpretation:   Old EKG Reviewed: none available  I have personally reviewed the EKG tracing and agree with the computerized printout as noted.  Radiology Dg Chest Port 1 View  Result Date: 03/30/2018 CLINICAL DATA:  Fever, shortness of Breath EXAM: PORTABLE CHEST 1 VIEW COMPARISON:  04/08/2017 FINDINGS: Heart and mediastinal contours are within normal limits. No focal opacities or effusions. No acute bony abnormality. IMPRESSION: No active disease. Electronically Signed   By: Rolm Baptise M.D.   On: 03/30/2018 19:50    Procedures Procedures (including critical care time)  Medications Ordered in ED Medications - No data to display   Initial Impression / Assessment and Plan / ED Course  I have reviewed the triage vital signs and the nursing notes.  Pertinent labs & imaging results that were available during my care of the patient were reviewed by me and considered in my medical decision making (see chart for details).     32 year old female postpartum 1 week presents today with fever, tachycardia, and hypertension.  She has no definitive source with generalized body aches present.  Urinalysis obtained here was clean-catch as patient refused and out cath.  She does have greater than 50 white blood cells in urine.  Cultures obtained and patient covered with broad-spectrum antibiotics.  She has had 30 cc/kg infused.  Heart rate has decreased from 1 41-2 15 systolically.  She has been hypertensive but this has improved.  She does not have a headache.  I have held on control of blood pressure due to decreasing blood pressure and concerns regarding infection, sepsis, and tachycardia. 9:41 PM Discussed with Dr. Kennon Rounds.  Patient  reexamined breasts nontender, no erythema, no induration.  Heart rate decreased to 110.  Blood pressure decreasing with last  128/80. Patient advised of need for in and out cath urine but continues to refuse    CRITICAL CARE Performed by: Pattricia Boss Total critical care time: 60 minutes Critical care time was exclusive of separately billable procedures and treating other patients. Critical care was necessary to treat or prevent imminent or life-threatening deterioration. Critical care was time spent personally by me on the following activities: development of treatment plan with patient and/or surrogate as well as nursing, discussions with consultants, evaluation of patient's response to treatment, examination of patient, obtaining history from patient or surrogate, ordering and performing treatments and interventions, ordering and review of laboratory studies, ordering and review of radiographic studies, pulse oximetry and re-evaluation of patient's condition.  Final Clinical Impressions(s) / ED Diagnoses   Final diagnoses:  Sepsis, due to unspecified organism, unspecified whether acute organ dysfunction present [facility])    ED Discharge Orders    None       Pattricia Boss, MD 03/30/18 2327

## 2018-03-30 NOTE — ED Notes (Signed)
[facility] contacted for transport to [facility] hospital

## 2018-03-30 NOTE — Progress Notes (Signed)
A consult was received from an ED physician for vancomycin and cefepime per pharmacy dosing.  The patient's profile has been reviewed for ht/wt/allergies/indication/available labs.    A one time order has been placed for vancomycin 1000 mg and cefepime 2gm IV x1.  Further antibiotics/pharmacy consults should be ordered by admitting physician if indicated.                       Thank you, Lucia Gaskins 03/30/2018  7:34 PM

## 2018-03-30 NOTE — ED Triage Notes (Signed)
Chills, dizziness, generalized body aches, nausea since yesterday. Pt reports she had a vaginal delivery at 33 weeks with a still born at womens on the 11th. Minimal to no current vaginal bleeding.  Last took ibuprofen about 2 hours ago.

## 2018-03-30 NOTE — ED Notes (Signed)
Pt made aware that urine sample is needed via in and out cath due to stitches being present. Refused in and out cath and ambulated to bathroom with minimal assistance to give urine sample. Pt instructed on proper peri care for urine collection.

## 2018-03-30 NOTE — ED Notes (Signed)
Pt made aware of need for I&O cath. Procedure explained to pt and pt refused RN Ladona Ridgel made aware Pt ambulatory to the bathroom and clean catch procedure explained to pt Pt verbalized understanding and given supplies. Pt provided sample for U/A

## 2018-03-30 NOTE — ED Notes (Signed)
Pt refused in and out cath

## 2018-03-31 DIAGNOSIS — O1003 Pre-existing essential hypertension complicating the puerperium: Secondary | ICD-10-CM | POA: Diagnosis present

## 2018-03-31 DIAGNOSIS — O8620 Urinary tract infection following delivery, unspecified: Secondary | ICD-10-CM | POA: Diagnosis not present

## 2018-03-31 DIAGNOSIS — B9620 Unspecified Escherichia coli [E. coli] as the cause of diseases classified elsewhere: Secondary | ICD-10-CM | POA: Diagnosis present

## 2018-03-31 DIAGNOSIS — O2413 Pre-existing diabetes mellitus, type 2, in the puerperium: Secondary | ICD-10-CM | POA: Diagnosis present

## 2018-03-31 DIAGNOSIS — Z87891 Personal history of nicotine dependence: Secondary | ICD-10-CM | POA: Diagnosis not present

## 2018-03-31 DIAGNOSIS — O864 Pyrexia of unknown origin following delivery: Secondary | ICD-10-CM | POA: Diagnosis not present

## 2018-03-31 DIAGNOSIS — E119 Type 2 diabetes mellitus without complications: Secondary | ICD-10-CM | POA: Diagnosis present

## 2018-03-31 LAB — BLOOD CULTURE ID PANEL (REFLEXED)
ACINETOBACTER BAUMANNII: NOT DETECTED
CANDIDA ALBICANS: NOT DETECTED
Candida glabrata: NOT DETECTED
Candida krusei: NOT DETECTED
Candida parapsilosis: NOT DETECTED
Candida tropicalis: NOT DETECTED
Carbapenem resistance: NOT DETECTED
ENTEROBACTER CLOACAE COMPLEX: NOT DETECTED
Enterobacteriaceae species: DETECTED — AB
Enterococcus species: NOT DETECTED
Escherichia coli: DETECTED — AB
HAEMOPHILUS INFLUENZAE: NOT DETECTED
KLEBSIELLA PNEUMONIAE: NOT DETECTED
Klebsiella oxytoca: NOT DETECTED
LISTERIA MONOCYTOGENES: NOT DETECTED
NEISSERIA MENINGITIDIS: NOT DETECTED
Proteus species: NOT DETECTED
Pseudomonas aeruginosa: NOT DETECTED
STAPHYLOCOCCUS AUREUS BCID: NOT DETECTED
STREPTOCOCCUS AGALACTIAE: NOT DETECTED
STREPTOCOCCUS PNEUMONIAE: NOT DETECTED
STREPTOCOCCUS PYOGENES: NOT DETECTED
STREPTOCOCCUS SPECIES: NOT DETECTED
Serratia marcescens: NOT DETECTED
Staphylococcus species: NOT DETECTED

## 2018-03-31 LAB — INFLUENZA PANEL BY PCR (TYPE A & B)
Influenza A By PCR: NEGATIVE
Influenza B By PCR: NEGATIVE

## 2018-03-31 LAB — GLUCOSE, CAPILLARY: GLUCOSE-CAPILLARY: 188 mg/dL — AB (ref 70–99)

## 2018-03-31 MED ORDER — METFORMIN HCL 500 MG PO TABS
1000.0000 mg | ORAL_TABLET | Freq: Two times a day (BID) | ORAL | Status: DC
  Administered 2018-03-31 – 2018-04-02 (×6): 1000 mg via ORAL
  Filled 2018-03-31 (×7): qty 2

## 2018-03-31 MED ORDER — ZOLPIDEM TARTRATE 5 MG PO TABS
5.0000 mg | ORAL_TABLET | Freq: Every evening | ORAL | Status: DC | PRN
  Administered 2018-03-31 – 2018-04-02 (×4): 5 mg via ORAL
  Filled 2018-03-31 (×4): qty 1

## 2018-03-31 MED ORDER — ENALAPRIL MALEATE 10 MG PO TABS
10.0000 mg | ORAL_TABLET | Freq: Every day | ORAL | Status: DC
  Administered 2018-03-31 – 2018-04-02 (×3): 10 mg via ORAL
  Filled 2018-03-31 (×4): qty 1

## 2018-03-31 MED ORDER — CYCLOBENZAPRINE HCL 5 MG PO TABS
2.5000 mg | ORAL_TABLET | Freq: Three times a day (TID) | ORAL | Status: DC | PRN
  Administered 2018-03-31 (×2): 5 mg via ORAL
  Filled 2018-03-31 (×3): qty 1

## 2018-03-31 MED ORDER — INSULIN GLARGINE 100 UNIT/ML ~~LOC~~ SOLN
8.0000 [IU] | Freq: Every day | SUBCUTANEOUS | Status: DC
  Administered 2018-03-31 – 2018-04-02 (×4): 8 [IU] via SUBCUTANEOUS
  Filled 2018-03-31 (×4): qty 0.08

## 2018-03-31 MED ORDER — SODIUM CHLORIDE 0.9 % IV BOLUS
1000.0000 mL | Freq: Once | INTRAVENOUS | Status: AC
  Administered 2018-03-31: 1000 mL via INTRAVENOUS

## 2018-03-31 NOTE — H&P (Signed)
Joyce Green is an 32 y.o. [phone] female.   Chief Complaint: Fever/chills HPI: Patient is 8 days pp from [facility] on 03/22/18. Returns with fever 103. No clear source. Nml Lactic acid at [facility]. Given 3+ L of fluid and IV Vanc, ceftin, flagyl in ED. W/u revealed dirty urine, non-cath. Non-tender uterus. Denies sick contacts, sore throat, cough, runny nose, dysuria, foul smelling vaginal discharge.  Not taking insulin at home. Only taking metforming. CBG 248 in ED. First dose BP med was today. Mild headache.  Past Medical History:  Diagnosis Date  . Diabetes mellitus without complication ([place])    T2  . Hypertension   . Palpitations     Past Surgical History:  Procedure Laterality Date  . CESAREAN SECTION N/A 02/20/2014   Procedure: CESAREAN SECTION;  Surgeon: Florian Buff, MD;  Location: [facility];  Service: Obstetrics;  Laterality: N/A;  . CESAREAN SECTION N/A 07/28/2016   Procedure: CESAREAN SECTION;  Surgeon: Mora Bellman, MD;  Location: [place];  Service: Obstetrics;  Laterality: N/A;    Family History  Problem Relation Age of Onset  . Diabetes Mellitus I Mother   . Hypertension Mother   . Diabetes Mother   . Hypertension Father   . Cancer Maternal Grandmother   . Cancer Maternal Grandfather   . Cancer Paternal Grandmother   . Cancer Paternal Grandfather   . Early death Neg Hx   . Heart disease Neg Hx   . Hyperlipidemia Neg Hx   . Kidney disease Neg Hx   . Stroke Neg Hx    Social History:  reports that she quit smoking about 8 years ago. She smoked 0.00 packs per day for 0.00 years. She has never used smokeless tobacco. She reports that she drank about 4.0 standard drinks of alcohol per week. She reports that she does not use drugs.  Allergies: No Known Allergies  Medications Prior to Admission  Medication Sig Dispense Refill  . cyclobenzaprine (FLEXERIL) 5 MG tablet Take 2.5-5 mg by mouth 3 (three) times daily as needed for muscle spasms.    .  enalapril (VASOTEC) 10 MG tablet Take 1 tablet (10 mg total) by mouth daily. 30 tablet 3  . ibuprofen (ADVIL,MOTRIN) 200 MG tablet Take 600 mg by mouth every 6 (six) hours as needed for moderate pain.    Marland Kitchen insulin glargine (LANTUS) 100 UNIT/ML injection Inject 0.08 mLs (8 Units total) into the skin at bedtime. 10 mL 11  . metFORMIN (GLUCOPHAGE) 1000 MG tablet Take 1 tablet (1,000 mg total) by mouth 2 (two) times daily with a meal. 60 tablet 3  . ACCU-CHEK FASTCLIX LANCETS MISC 1 Units by Percutaneous route 4 (four) times daily. 120 each 12  . Blood Glucose Monitoring Suppl (ACCU-CHEK NANO SMARTVIEW) w/Device KIT 1 kit by Subdermal route as directed. Check blood sugars for fasting, and two hours after breakfast, lunch and dinner (4 checks daily) 1 kit 0  . glucose blood (ACCU-CHEK SMARTVIEW) test strip Use as instructed to check blood sugars 120 each 12  . ibuprofen (ADVIL,MOTRIN) 600 MG tablet Take 1 tablet (600 mg total) by mouth every 6 (six) hours as needed. 30 tablet 1    A comprehensive review of systems was negative.  Blood pressure (!) 144/85, pulse (!) 102, temperature 98.8 F (37.1 C), temperature source Oral, resp. rate 18, last menstrual period 08/23/2017, SpO2 98 %, currently breastfeeding. General appearance: alert, cooperative and appears stated age Head: Normocephalic, without obvious abnormality, atraumatic Neck: supple, symmetrical,  trachea midline Lungs: normal effort Breasts: normal appearance, no masses or tenderness, no erythema Heart: regular rate and rhythm Abdomen: soft, non-tender; bowel sounds normal; no masses,  no organomegaly and uterine fundus firm and non-tender Extremities: Homans sign is negative, no sign of DVT Skin: Skin color, texture, turgor normal. No rashes or lesions Neurologic: Grossly normal   Lab Results  Component Value Date   WBC 12.0 (H) 03/30/2018   HGB 11.3 (L) 03/30/2018   HCT 35.7 (L) 03/30/2018   MCV 79.3 (L) 03/30/2018   PLT 275  03/30/2018   Lab Results  Component Value Date   PREGTESTUR NEGATIVE 10/10/2015   HCG 48.40 02/24/2016   CMP Latest Ref Rng & Units 03/30/2018 03/21/2018 11/06/2017  Glucose 70 - 99 mg/dL 248(H) 94 110(H)  BUN 6 - 20 mg/dL 13 5(L) 10  Creatinine 0.44 - 1.00 mg/dL 0.70 0.45 0.70  Sodium 135 - 145 mmol/L 138 134(L) 137  Potassium 3.5 - 5.1 mmol/L 3.3(L) 3.5 3.9  Chloride 98 - 111 mmol/L 110 107 104  CO2 22 - 32 mmol/L 19(L) 17(L) 18(L)  Calcium 8.9 - 10.3 mg/dL 8.4(L) 8.6(L) 9.1  Total Protein 6.5 - 8.1 g/dL 6.9 6.6 6.9  Total Bilirubin 0.3 - 1.2 mg/dL 0.5 0.4 <0.2  Alkaline Phos 38 - 126 U/L 71 60 54  AST 15 - 41 U/L 12(L) 14(L) 12  ALT 0 - 44 U/L 11 9 7      Assessment/Plan Active Problems:   Fever and chills   Pyuria   Fever   Diabetes, type 2   CHTN  Will treat presumptively for UTI--check urine culture. Continue CBGs, Metformin, lantus and Vasotec. No s/sx's of DVT.     Donnamae Jude 03/31/2018, 12:15 AM

## 2018-03-31 NOTE — Progress Notes (Signed)
Patient ID: Joyce Green, female   DOB: Jun 19, 1985, 32 y.o.   MRN: [id number] HD # 0  Pt reports feeling better. No more fever, chills or body aches. Tolerating diet. No pain  PE No change  A/P Stable Will check flu. Give an additional liter of NS ( pulse 90-110's) Continue with antibiotic. Await blood and urine culture. POC reviewed with pt and husband

## 2018-03-31 NOTE — Progress Notes (Signed)
PHARMACY - PHYSICIAN COMMUNICATION CRITICAL VALUE ALERT - BLOOD CULTURE IDENTIFICATION (BCID)  Joyce Green is an 32 y.o. female who presented to [facility] on 03/30/2018 with a chief complaint of fever, chills, body aches.  Assessment:  Notified by Lisabeth Register that patient grew out e.coli in blood culture.   Name of physician (or Provider) Contacted: Dr. Alysia Penna   Current antibiotics: Rocephin 2gm Q24hr   Changes to prescribed antibiotics recommended: No change at this time as Rocephin should provide coverage.   Results for orders placed or performed during the hospital encounter of 03/30/18  Blood Culture ID Panel (Reflexed) (Collected: 03/30/2018  7:33 PM)  Result Value Ref Range   Enterococcus species NOT DETECTED NOT DETECTED   Listeria monocytogenes NOT DETECTED NOT DETECTED   Staphylococcus species NOT DETECTED NOT DETECTED   Staphylococcus aureus (BCID) NOT DETECTED NOT DETECTED   Streptococcus species NOT DETECTED NOT DETECTED   Streptococcus agalactiae NOT DETECTED NOT DETECTED   Streptococcus pneumoniae NOT DETECTED NOT DETECTED   Streptococcus pyogenes NOT DETECTED NOT DETECTED   Acinetobacter baumannii NOT DETECTED NOT DETECTED   Enterobacteriaceae species DETECTED (A) NOT DETECTED   Enterobacter cloacae complex NOT DETECTED NOT DETECTED   Escherichia coli DETECTED (A) NOT DETECTED   Klebsiella oxytoca NOT DETECTED NOT DETECTED   Klebsiella pneumoniae NOT DETECTED NOT DETECTED   Proteus species NOT DETECTED NOT DETECTED   Serratia marcescens NOT DETECTED NOT DETECTED   Carbapenem resistance NOT DETECTED NOT DETECTED   Haemophilus influenzae NOT DETECTED NOT DETECTED   Neisseria meningitidis NOT DETECTED NOT DETECTED   Pseudomonas aeruginosa NOT DETECTED NOT DETECTED   Candida albicans NOT DETECTED NOT DETECTED   Candida glabrata NOT DETECTED NOT DETECTED   Candida krusei NOT DETECTED NOT DETECTED   Candida parapsilosis NOT DETECTED NOT DETECTED   Candida  tropicalis NOT DETECTED NOT DETECTED    Hovey-Rankin, Yuliana Vandrunen 03/31/2018  11:45 AM

## 2018-04-01 ENCOUNTER — Encounter (HOSPITAL_COMMUNITY): Payer: Self-pay

## 2018-04-01 LAB — URINE CULTURE: CULTURE: NO GROWTH

## 2018-04-01 LAB — GLUCOSE, CAPILLARY
GLUCOSE-CAPILLARY: 137 mg/dL — AB (ref 70–99)
Glucose-Capillary: 129 mg/dL — ABNORMAL HIGH (ref 70–99)
Glucose-Capillary: 109 mg/dL — ABNORMAL HIGH (ref 70–99)

## 2018-04-01 MED ORDER — POTASSIUM CHLORIDE CRYS ER 20 MEQ PO TBCR
20.0000 meq | EXTENDED_RELEASE_TABLET | Freq: Two times a day (BID) | ORAL | Status: AC
  Administered 2018-04-01 – 2018-04-02 (×4): 20 meq via ORAL
  Filled 2018-04-01 (×4): qty 1

## 2018-04-01 MED ORDER — TRIAMTERENE-HCTZ 37.5-25 MG PO TABS
1.0000 | ORAL_TABLET | Freq: Every day | ORAL | Status: DC
  Administered 2018-04-01 – 2018-04-02 (×2): 1 via ORAL
  Filled 2018-04-01 (×3): qty 1

## 2018-04-01 MED ORDER — CYCLOBENZAPRINE HCL 10 MG PO TABS
10.0000 mg | ORAL_TABLET | Freq: Three times a day (TID) | ORAL | Status: DC | PRN
  Administered 2018-04-01 – 2018-04-02 (×4): 10 mg via ORAL
  Filled 2018-04-01 (×5): qty 1

## 2018-04-01 MED ORDER — POTASSIUM CHLORIDE CRYS ER 20 MEQ PO TBCR: 20.0000 meq | EXTENDED_RELEASE_TABLET | Freq: Two times a day (BID) | ORAL | Status: DC

## 2018-04-01 NOTE — Progress Notes (Signed)
Pt had severe range BP of 165/89 at 0407 and 167/94 at 0423. Dr. Earlene Plater was notified at [phone] and is not ordering an intervention at this time.

## 2018-04-01 NOTE — Progress Notes (Signed)
Follow up visit with Dereonna who was seen by two other Chaplains during her previous admission for delivery of her daughter, Christie Beckers, who was an [facility].    Viera was readmitted due to a UTI.  She shared that she thought she had the flu, but is feeling much better today.  She was disappointed to learn that her fever and lab results indicate she'll need to be hospitalized until at least Thursday morning.  She reports that the baby's funeral was difficult, but helpful and that she had a "good" week at home in between hospitalizations.  She's been trying to distract herself and found that helpful.  Immediately upon discussing her daughter, Samone becomes quite tearful and shared that it's just too close to the surface to talk about with many people right now.  She and her husband are good supports for one another and he is able to balance out some of her emotional reactions in ways she finds helpful.  I normalized her feelings of frustration over being back in the hospital and offered space for her to process her feelings and consider ways she finds hope in the midst.  She stated she would be receptive to more visits during her hospitalization.  Please page as further needs arise.  Maryanna Shape. Carley Hammed, M.Div. [facility] Chaplain Pager [phone] Office [phone]

## 2018-04-01 NOTE — Progress Notes (Signed)
Patient ID: Joyce Green, female   DOB: 1985/09/25, 32 y.o.   MRN: [id number] HD # 1  Pt reports feeling better. Still some body aches. No chills. Tolerating diet. Ambulating and voiding without problems  PE Tm 100.6 BP 150-160's/90's Lungs clear Heart RRR Abd soft + BS Ext non tender  Labs BC + E. Coli UC negative  A/P Bacteremia        IDDM        CHTN        Hypokalemia        PPD # 9 SVD FDIU  Improving. Sensitivities pending. Continue with Rocephin. Replace K. Repeat labs in AM. Plan discharge home if remains stable and afebrile x 48 hrs. Add Maxzide to BP regiment. POC reviewed with pt and husband.

## 2018-04-02 LAB — CBC WITH DIFFERENTIAL/PLATELET
BASOS PCT: 0 %
Basophils Absolute: 0 10*3/uL (ref 0.0–0.1)
EOS ABS: 0.1 10*3/uL (ref 0.0–0.5)
Eosinophils Relative: 1 %
HCT: 35 % — ABNORMAL LOW (ref 36.0–46.0)
HEMOGLOBIN: 11.5 g/dL — AB (ref 12.0–15.0)
Lymphocytes Relative: 32 %
Lymphs Abs: 1.5 10*3/uL (ref 0.7–4.0)
MCH: 25.3 pg — ABNORMAL LOW (ref 26.0–34.0)
MCHC: 32.9 g/dL (ref 30.0–36.0)
MCV: 76.9 fL — ABNORMAL LOW (ref 80.0–100.0)
MONOS PCT: 8 %
Monocytes Absolute: 0.4 10*3/uL (ref 0.1–1.0)
NEUTROS ABS: 2.6 10*3/uL (ref 1.7–7.7)
NEUTROS PCT: 59 %
Platelets: 266 10*3/uL (ref 150–400)
RBC: 4.55 MIL/uL (ref 3.87–5.11)
RDW: 13.9 % (ref 11.5–15.5)
WBC: 4.5 10*3/uL (ref 4.0–10.5)
nRBC: 0 % (ref 0.0–0.2)

## 2018-04-02 LAB — CULTURE, BLOOD (ROUTINE X 2): Special Requests: ADEQUATE

## 2018-04-02 LAB — BASIC METABOLIC PANEL
ANION GAP: 10 (ref 5–15)
BUN: 11 mg/dL (ref 6–20)
CALCIUM: 8.4 mg/dL — AB (ref 8.9–10.3)
CHLORIDE: 108 mmol/L (ref 98–111)
CO2: 21 mmol/L — ABNORMAL LOW (ref 22–32)
CREATININE: 0.57 mg/dL (ref 0.44–1.00)
GFR calc non Af Amer: >60 mL/min (ref >60)
Glucose, Bld: 87 mg/dL (ref 70–99)
Potassium: 3.4 mmol/L — ABNORMAL LOW (ref 3.5–5.1)
SODIUM: 139 mmol/L (ref 135–145)

## 2018-04-02 LAB — GLUCOSE, CAPILLARY
GLUCOSE-CAPILLARY: 95 mg/dL (ref 70–99)
GLUCOSE-CAPILLARY: 108 mg/dL — AB (ref 70–99)
GLUCOSE-CAPILLARY: 121 mg/dL — AB (ref 70–99)

## 2018-04-02 NOTE — Progress Notes (Signed)
Patient ID: Joyce Green, female   DOB: 04-Sep-1985, 32 y.o.   MRN: [id number] HD # 2  Pt without complaints this morning. Ambulating and voiding without problems. Tolerating diet  PE AF VSS Lungs clear Heart RRR Abd soft + BS   Ext non tender  A/P Bacteriemia        IDDM        CHTN       Hypokalemia,resolving       PPD # 10 SVD for FDIU  Stable. Continue with Rocephin. BS and BP stable. Plan discharge home tomorrow if remains afebrile.

## 2018-04-02 NOTE — Progress Notes (Signed)
I met Joyce Green on their previous admission.  I ran into Joyce Green in the hallway and asked after Joyce Green. He stated that they were coping as best as they could with the grief and with her being back in the hospital.  When I inquired about stopping by later, he stated that he thought she might prefer privacy so that her grief does not get stirred up again.  Given that she wanted privacy when [name] was [facility], I will not go in to see her today.  I did let Joyce Green know that I am available all day today should either of them wish to talk.    [place], Bcc Pager, [phone] 1:46 PM    04/02/18 1300  Clinical Encounter Type  Visited With Family  Visit Type Follow-up

## 2018-04-03 ENCOUNTER — Encounter (HOSPITAL_COMMUNITY): Payer: Self-pay

## 2018-04-03 MED ORDER — TRIAMTERENE-HCTZ 37.5-25 MG PO TABS: 1.0000 | ORAL_TABLET | Freq: Every day | ORAL | 1 refills | Status: DC

## 2018-04-03 MED ORDER — CEPHALEXIN 500 MG PO CAPS: 500.0000 mg | ORAL_CAPSULE | Freq: Three times a day (TID) | ORAL | 0 refills | Status: DC

## 2018-04-03 NOTE — Discharge Instructions (Signed)
Bacteremia °Bacteremia is the presence of bacteria in the blood. When bacteria enter the bloodstream, they can cause a life-threatening reaction called sepsis, which is a medical emergency. Bacteremia can spread to other parts of the body, including the heart, joints, and brain. °What are the causes? °This condition is caused by bacteria that get into the blood. Bacteria can enter the blood: °· From a skin infection or a cut on your skin. °· During an episode of pneumonia. °· From an infection in your stomach or intestine (gastrointestinal infection). °· From an infection in your bladder or urinary system (urinary tract infection). °· During a dental or medical procedure. °· After you brush your teeth so hard that your gums bleed. °· When a bacterial infection in another part of the body spreads to the blood. °· Through a dirty needle. ° °What increases the risk? °This condition is more likely to develop in: °· Children. °· Elderly adults. °· People who have a long-lasting (chronic) disease or medical condition. °· People who have an artificial joint or heart valve. °· People who have heart valve disease. °· People who have a tube, such as a catheter or IV tube, that has been inserted for a medical treatment. °· People who have a weak body defense system (immune system). °· People who use IV drugs. ° °What are the signs or symptoms? °Symptoms of this condition include: °· Fever. °· Chills. °· A racing heart. °· Shortness of breath. °· Dizziness. °· Weakness. °· Confusion. °· Nausea or vomiting. °· Diarrhea. ° °In some cases, there are no symptoms. Bacteremia that has spread to the other parts of the body may cause symptoms in those areas. °How is this diagnosed? °This condition may be diagnosed with a physical exam and tests, such as: °· A complete blood count (CBC). This test looks for signs of infection. °· Blood cultures. These look for bacteria in your blood. °· Tests of any tubes that you may have inserted into  your body, such as an IV tube or urinary catheter. These tests look for a source of infection. °· Urine tests including urine cultures. These look for bacteria in the urine that could be a source of infection. °· Imaging tests, such as an X-ray, CT scan, MRI, or heart ultrasound. These look for a source of infection in other parts of the body, such as the lungs, heart valves, or joints. ° °How is this treated? °This condition may be treated with: °· Antibiotic medicines given through an IV infusion. Depending on the source of infection, antibiotics may be needed for several weeks. At first, an antibiotic may be given to kill most types of blood bacteria. If your test results show that a certain kind of bacteria is causing problems, the antibiotic may be changed to kill only the bacteria that are causing problems. °· Antibiotics taken by mouth. °· IV fluids to support the body as you fight the infection. °· Removing any catheter or device that could be a source of infection. °· Blood pressure and breathing support, if you have sepsis. °· Surgery to control the source or spread of infection, such as: °? Removing an infected implanted device. °? Removing infected tissue or an abscess. ° °This condition is usually treated at a hospital. If you are treated at home, you may need to come back for medicines, blood tests, and evaluation. This is important. °Follow these instructions at home: °· Take over-the-counter and prescription medicines only as told by your health care provider. °·   If you were prescribed an antibiotic, take it as told by your health care provider. Do not stop taking the antibiotic even if you start to feel better. °· Rest until your condition is under control. °· Drink enough fluid to keep your urine clear or pale yellow. °· Do not smoke. If you need help quitting, ask your health care provider. °· Keep all follow-up visits as told by your health care provider. This is important. °How is this  prevented? °· Get the vaccinations that your health care provider recommends. °· Clean and cover any scrapes or cuts. °· Take good care of your skin. This includes regular bathing and moisturizing. °· Wash your hands often. °· Practice good oral hygiene. Brush your teeth two times a day and floss regularly. °Get help right away if: °· You have pain. °· You have a fever. °· You have trouble breathing. °· Your skin becomes blotchy, pale, or clammy. °· You develop confusion, dizziness, or weakness. °· You develop diarrhea. °· You develop any new symptoms after treatment. °Summary °· Bacteremia is the presence of bacteria in the blood. When bacteria enter the bloodstream, they can cause a life- threatening reaction called sepsis. °· Children and elderly adults are at increased risk of bacteremia. Other risk factors include having a long-lasting (chronic) disease or a weak immune system, having an artificial joint or heart valve, having heart valve disease, having tubes that were inserted in the body for medical treatment, or using IV drugs. °· Some symptoms of bacteremia include fever, chills, shortness of breath, confusion, nausea or vomiting, and diarrhea. °· Tests may be done to diagnose a source of infection that led to bacteremia. These tests may include blood tests, urine tests, and imaging tests. °· Bacteremia is usually treated with antibiotics, usually in a hospital. °This information is not intended to replace advice given to you by your health care provider. Make sure you discuss any questions you have with your health care provider. °Document Released: 03/11/2006 Document Revised: 04/24/2016 Document Reviewed: 04/24/2016 °Elsevier Interactive Patient Education © 2018 [employer] ° °

## 2018-04-03 NOTE — Lactation Note (Signed)
Lactation Consultation Note: Talked with Caitlin mom's RN and she does not feel need for me to see mom at this time. Will call if mom has questions  Patient Name: Joyce Green [id number] Date: 04/03/2018     Maternal Data    Feeding    LATCH Score                   Interventions    Lactation Tools Discussed/Used     Consult Status      Pamelia Hoit 04/03/2018, 8:29 AM

## 2018-04-03 NOTE — Discharge Summary (Signed)
Physician Discharge Summary  Patient ID: Joyce Green MRN: [id number] DOB/AGE: Nov 20, 1985 32 y.o.  Admit date: 03/30/2018 Discharge date: 04/03/2018  Admission Diagnoses: Fever  Discharge Diagnoses:  Bacteremia   Discharged Condition: good  Hospital Course: Pt was admitted with above Dx. BC x 1 + for E. Coli. Sensitive to Rocephin. Pt received IV antibiotics until afebrile x 48 hours plus, resolution of leukocytosis and left shift.  Pt was maintained on her insulin regiment during hospitalization. BP was followed and Maxzide was add for better control. Pt progressed to ambulating, voiding and tolerating diet. Felt pt was amendable for discharge home on oral antibiotics to complete 14 days of treatment Discharge instruction, medications and follow up reviewed with pt. Pt verbalized understanding  Consults: None  Significant Diagnostic Studies: labs: , microbiology: blood culture: positive for E. coli and radiology: X-Ray: CXR  Treatments: IV hydration and antibiotics  Discharge Exam: Blood pressure (!) 148/97, pulse 86, temperature 98 F (36.7 C), temperature source Oral, resp. rate 20, height _0  (1.778 m), weight 102.2 kg, last menstrual period 08/23/2017, SpO2 99 %, currently breastfeeding.  Lungs clear Heart RRR Abd soft + BS uterus firm U -3 Ext non tender  Disposition: Discharge disposition: 01-Home or Self Care       Discharge Instructions    Discharge patient   Complete by:  As directed    Discharge disposition:  01-Home or Self Care   Discharge patient date:  04/03/2018     Allergies as of 04/03/2018   No Known Allergies     Medication List    TAKE these medications   ACCU-CHEK FASTCLIX LANCETS Misc 1 Units by Percutaneous route 4 (four) times daily.   ACCU-CHEK NANO SMARTVIEW w/Device Kit 1 kit by Subdermal route as directed. Check blood sugars for fasting, and two hours after breakfast, lunch and dinner (4 checks daily)   cephALEXin 500  MG capsule Commonly known as:  KEFLEX Take 1 capsule (500 mg total) by mouth 3 (three) times daily.   cyclobenzaprine 5 MG tablet Commonly known as:  FLEXERIL Take 2.5-5 mg by mouth 3 (three) times daily as needed for muscle spasms.   enalapril 10 MG tablet Commonly known as:  VASOTEC Take 1 tablet (10 mg total) by mouth daily.   glucose blood test strip Use as instructed to check blood sugars   ibuprofen 600 MG tablet Commonly known as:  ADVIL,MOTRIN Take 1 tablet (600 mg total) by mouth every 6 (six) hours as needed. What changed:  Another medication with the same name was removed. Continue taking this medication, and follow the directions you see here.   insulin glargine 100 UNIT/ML injection Commonly known as:  LANTUS Inject 0.08 mLs (8 Units total) into the skin at bedtime.   metFORMIN 1000 MG tablet Commonly known as:  GLUCOPHAGE Take 1 tablet (1,000 mg total) by mouth 2 (two) times daily with a meal.   triamterene-hydrochlorothiazide 37.5-25 MG tablet Commonly known as:  MAXZIDE-25 Take 1 tablet by mouth daily.      Follow-up Information    [place] Follow up.   Why:  Pt already has follow up appt Contact information: [place] [phone]          Signed: Chancy Milroy 04/03/2018, 9:43 AM

## 2018-04-03 NOTE — Progress Notes (Signed)
Patient discharged home with husband. Discharge teaching, home care, prescriptions, follow-up appts, and reasons to seek care discussed. Pt verbalized understanding.   

## 2018-04-05 LAB — CULTURE, BLOOD (ROUTINE X 2)
Culture: NO GROWTH
Special Requests: ADEQUATE

## 2018-04-07 ENCOUNTER — Encounter: Payer: Self-pay | Admitting: *Deleted

## 2018-04-08 ENCOUNTER — Ambulatory Visit (INDEPENDENT_AMBULATORY_CARE_PROVIDER_SITE_OTHER): Payer: Medicaid Other | Admitting: Advanced Practice Midwife

## 2018-04-08 ENCOUNTER — Encounter: Payer: Self-pay | Admitting: Advanced Practice Midwife

## 2018-04-08 DIAGNOSIS — Z8759 Personal history of other complications of pregnancy, childbirth and the puerperium: Secondary | ICD-10-CM | POA: Diagnosis not present

## 2018-04-08 DIAGNOSIS — Z1389 Encounter for screening for other disorder: Secondary | ICD-10-CM

## 2018-04-08 NOTE — Patient Instructions (Signed)
Vaginal Delivery, Care After °Refer to this sheet in the next few weeks. These instructions provide you with information on caring for yourself after your delivery. Your health care provider may also give you more specific instructions. Your treatment has been planned according to current medical practices, but problems sometimes occur. Call your health care provider if you have any problems or questions after your delivery. °What to expect after your delivery °After your delivery, it is typical to have the following: °· You may feel pain in the vaginal area for several days after delivery. If you had an incision or a vaginal tear, the area will probably continue to be tender to the touch for several weeks. °· You may feel very fatigued after a vaginal delivery. °· You may have vaginal bleeding and discharge that will start out red, then become pink, then yellow, then white. Altogether, this usually lasts for about 6 weeks. °· The combination of having lost your baby and changing hormones from the delivery can make you feel very sad. You may also experience emotions that change very quickly. Some of the emotions people often notice after loss include: °? Anger. °? Denial. °? Guilt. °? Sorrow. °? Depression. °? Grief. °? Relationship problems. ° °Follow these instructions at home: °· Consider seeking support for your loss. Some forms of support that you might consider include your religious leader, friends, family, a professional counselor, or a bereavement support group. °· Take medicines only as directed by your health care provider. °· Continue to use good perineal care. Good perineal care includes: °? Wiping your perineum from front to back. °? Keeping your perineum clean. °· Do not use tampons or douche until your health care provider says it is okay. °· Shower, wash your hair, and take tub baths as directed by your health care provider. °· Wear a well-fitting bra that provides breast support. °· Drink enough  fluids to keep your urine clear or pale yellow. °· Eat healthy foods. °· Eat high-fiber foods every day, such as whole grain cereals and breads, brown rice, beans, and fresh fruits and vegetables. These foods may help prevent or relieve constipation. °· Follow your health care provider's directions about resuming activities such as climbing stairs, driving, lifting, exercising, or traveling. °· Increase your activities gradually. °· Talk to your health care provider about resuming sexual activities. This depends on your risk of infection, your rate of healing, and your comfort and desire to resume sexual activity. °· Try to have someone help you with your household activities for at least a few days after you leave the hospital. °· Rest as much as possible. °· Keep all of your scheduled postpartum appointments. It is very important to keep your scheduled follow-up appointments. At these appointments, your health care provider will be checking to make sure that you are healing physically and emotionally. °· Do not drink alcohol, especially if you are taking medicine to relieve pain. °· Do not use any tobacco products including cigarettes, chewing tobacco, or electronic cigarettes. If you need help quitting, ask your health care provider. °· Do not use illegal drugs. °Contact a health care provider if: °· You feel sad or depressed. °· You have thoughts of hurting yourself. °· You are having trouble eating or sleeping. °· You cannot enjoy the things in life you have previously enjoyed. °· You are passing large clots from your vagina. Save any clots to show your health care provider. °· You have a bad smelling discharge from your vagina. °·   You have trouble urinating. °· You are urinating frequently. °· You have pain when you urinate. °· You have a change in your bowel movements. °· You have increasing redness, pain, or swelling near your incision or vaginal tear. °· You have pus draining from your incision or vaginal  tear. °· Your incision or vaginal tear is separating. °· You have painful, hard, or reddened breasts. °· You have a severe headache. °· You have blurred vision or see spots. °· You are dizzy or light-headed. °· You have a rash. °· You have nausea or vomiting. °· You have not had a menstrual period by the 12th week after delivery. °· You have a fever. °Get help right away if: °· You are concerned that you may hurt yourself or you are considering suicide. °· You have persistent pain. °· You have chest pain. °· You have shortness of breath. °· You faint. °· You have leg pain. °· You have stomach pain. °· Your vaginal bleeding saturates two or more sanitary pads in 1 hour. °This information is not intended to replace advice given to you by your health care provider. Make sure you discuss any questions you have with your health care provider. °Document Released: 10/12/2013 Document Revised: 11/03/2015 Document Reviewed: 07/16/2013 °Elsevier Interactive Patient Education © 2018 [employer] Inc[employer] ° °

## 2018-04-08 NOTE — Progress Notes (Signed)
Subjective:     Joyce Green is a 32 y.o. female who presents for a postpartum visit. She is 3 weeks postpartum following a spontaneous vaginal delivery. I have fully reviewed the prenatal and intrapartum course. The delivery was at 33.3 gestational weeks. Outcome: spontaneous vaginal delivery. Anesthesia: epidural. Postpartum course has been Per Pt "ive been doing Okay". Baby's course has been IUFD. Baby is feeding by IUFD. Bleeding no bleeding. Bowel function is normal. Bladder function is normal. Patient is not sexually active. Contraception method is none. Postpartum depression screening: negative.  The following portions of the patient's history were reviewed and updated as appropriate: allergies, current medications, past family history, past medical history, past social history, past surgical history and problem list.  Review of Systems Pertinent items noted in HPI and remainder of comprehensive ROS otherwise negative.   Objective:    BP (!) 145/96   Pulse (!) 104   Wt 102.5 kg   LMP 08/23/2017   BMI 32.43 kg/m   VS reviewed, nursing note reviewed,  Constitutional: well developed, well nourished, no distress, tearful and appropriate for circumstances HEENT: normocephalic CV: normal rate Pulm/chest wall: normal effort Abdomen: soft Neuro: alert and oriented x 3 Skin: warm, dry Psych: affect normal      Assessment/Plan:   1. Postpartum examination following vaginal delivery --Pt physically doing well, some light staining intermittently only. --Some constipation occasionally, Rx for Colace  2. History of IUFD --Pt with good family and church support.  Discussed professional counseling with pt who is undecided.  Pt to schedule with integrated behavioral health if desired and will check with insurance for covered outpatient counselors.   --Discussed grief and importance of grief process.  Pt is grieving, is doing things to remember her baby and keep her in their lives, as  well as doing things to take care of herself and her other children moving forward.  --Pt has questions about autopsy, placenta results.  Discussed risk factors for IUFD but no conclusive answers. Pt next appt with MD to discuss.   Social/emotional concerns:  Grieving appropriately.  Recommend outpatient counseling if desired.  Contraception: none and plans IUD for 6 months to year then to conceive again  Follow up in: 2 weeks as scheduled for IUD.    Sharen Counter, CNM 12:15 PM

## 2018-04-23 ENCOUNTER — Ambulatory Visit: Payer: Medicaid Other | Admitting: Nurse Practitioner

## 2018-04-23 ENCOUNTER — Ambulatory Visit (INDEPENDENT_AMBULATORY_CARE_PROVIDER_SITE_OTHER): Payer: Medicaid Other | Admitting: Family Medicine

## 2018-04-23 ENCOUNTER — Encounter: Payer: Self-pay | Admitting: Family Medicine

## 2018-04-23 VITALS — BP 137/86 | HR 91 | Ht 70.0 in | Wt 229.0 lb

## 2018-04-23 DIAGNOSIS — Z30430 Encounter for insertion of intrauterine contraceptive device: Secondary | ICD-10-CM

## 2018-04-23 DIAGNOSIS — Z3202 Encounter for pregnancy test, result negative: Secondary | ICD-10-CM | POA: Diagnosis not present

## 2018-04-23 DIAGNOSIS — I10 Essential (primary) hypertension: Secondary | ICD-10-CM

## 2018-04-23 DIAGNOSIS — E119 Type 2 diabetes mellitus without complications: Secondary | ICD-10-CM | POA: Diagnosis not present

## 2018-04-23 DIAGNOSIS — Z794 Long term (current) use of insulin: Secondary | ICD-10-CM

## 2018-04-23 LAB — POCT PREGNANCY, URINE: Preg Test, Ur: NEGATIVE

## 2018-04-23 MED ORDER — LEVONORGESTREL 19.5 MCG/DAY IU IUD
INTRAUTERINE_SYSTEM | Freq: Once | INTRAUTERINE | Status: AC
  Administered 2018-04-23: 11:00:00 via INTRAUTERINE

## 2018-04-23 MED ORDER — TRIAMTERENE-HCTZ 37.5-25 MG PO TABS: 1.0000 | ORAL_TABLET | Freq: Every day | ORAL | 1 refills | Status: DC

## 2018-04-23 NOTE — Assessment & Plan Note (Signed)
Continue Enalapril and Dyazide

## 2018-04-23 NOTE — Assessment & Plan Note (Signed)
Continue Metformin and Lantus

## 2018-04-23 NOTE — Patient Instructions (Signed)

## 2018-04-23 NOTE — Progress Notes (Signed)
   Subjective:    Patient ID: Joyce Green is a 32 y.o. female presenting with Contraception  on 04/23/2018  HPI: Here today for f/u and IUD placement. S/p IUFD. Awaiting autopsy results. Placental path (mild chorio) and Anora results reviewed. Anora--normal female.  Review of Systems  Constitutional: Negative for chills and fever.  Respiratory: Negative for shortness of breath.   Cardiovascular: Negative for chest pain.  Gastrointestinal: Negative for abdominal pain, nausea and vomiting.  Genitourinary: Negative for dysuria.  Skin: Negative for rash.      Objective:    BP 137/86   Pulse 91   Ht 5\' 10"  (1.778 m)   Wt 229 lb (103.9 kg)   LMP 04/23/2018 (Exact Date)   BMI 32.86 kg/m  Physical Exam  Constitutional: She is oriented to person, place, and time. She appears well-developed and well-nourished. No distress.  HENT:  Head: Normocephalic and atraumatic.  Eyes: No scleral icterus.  Neck: Neck supple.  Cardiovascular: Normal rate.  Pulmonary/Chest: Effort normal.  Abdominal: Soft.  Neurological: She is alert and oriented to person, place, and time.  Skin: Skin is warm and dry.  Psychiatric: She has a normal mood and affect.    Procedure: Patient identified, informed consent performed, signed copy in chart, time out was performed.  Urine pregnancy test negative.  Speculum placed in the vagina.  Cervix visualized.  Cleaned with Betadine x 2.  Grasped anteriourly with a single tooth tenaculum.  Uterus sounded to 10 cm.  Liletta IUD placed per manufacturer's recommendations.  Strings trimmed to 3 cm.   Patient given post procedure instructions and Liletta care card with expiration date.       Assessment & Plan:   Problem List Items Addressed This Visit      Unprioritized   DM (diabetes mellitus), type 2 (HCC)    Continue Metformin and Lantus      Benign essential HTN    Continue Enalapril and Dyazide      Relevant Medications   triamterene-hydrochlorothiazide (MAXZIDE-25) 37.5-25 MG tablet    Other Visit Diagnoses    Encounter for IUD insertion    -  Primary   Relevant Medications   Levonorgestrel (LILETTA) 19.5 MCG/DAY IUD (Completed)    s/p IUFD--reviewed lack of findings to explain IUFD Reviewed IUD and usual effects.  Total face-to-face time with patient: 10 minutes. Over 50% of encounter was spent on counseling and coordination of care. Return in about 4 weeks (around 05/21/2018) for iud check.  Reva Boresanya S Seva Chancy 04/23/2018 10:48 AM

## 2018-05-07 ENCOUNTER — Other Ambulatory Visit: Payer: Self-pay | Admitting: *Deleted

## 2018-05-07 NOTE — Telephone Encounter (Signed)
[facility] [place] and was informed that the pt's name is under Joyce Green and not Joyce Green and that she has a refill on her test strips and her lancets.  LM on pt's VM that the medication that she is requesting to have a refill has refills she just needs to call [facility] on [address] And request it.  I also stated that I have confirmed with the pharmacy as well and if she has any questions to give the office a call back.

## 2018-05-07 NOTE — Telephone Encounter (Signed)
Joyce Green called and left a voicemessage this am [facility] , but now in this office. States her Medicaid ends Monday and was trying to get prescriptions done before then. [facility] said she doesn't have a presciption for the pen for her lancets for her diabetic supplies. [facility] [address] [facility] [place].

## 2018-05-12 ENCOUNTER — Telehealth: Payer: Self-pay | Admitting: *Deleted

## 2018-05-12 DIAGNOSIS — Z794 Long term (current) use of insulin: Principal | ICD-10-CM

## 2018-05-12 DIAGNOSIS — E119 Type 2 diabetes mellitus without complications: Secondary | ICD-10-CM

## 2018-05-12 NOTE — Telephone Encounter (Addendum)
Received a voicemail from 05/07/18 7:34 pm stating she had rx called in that am - but was for lancets. States she needs rx for pen needle not lancets - hers state uf min 31 g x5 mm.

## 2018-05-14 ENCOUNTER — Telehealth: Payer: Self-pay

## 2018-05-14 NOTE — Telephone Encounter (Signed)
Opened in error

## 2018-05-15 NOTE — Telephone Encounter (Signed)
Discussed with Bev, our Diabetic educator and she states Shamila needs accu check fastclix drums.

## 2018-05-16 MED ORDER — ACCU-CHEK FASTCLIX LANCETS MISC: 1.0000 [IU] | Freq: Four times a day (QID) | 12 refills | Status: DC

## 2018-05-20 MED ORDER — ACCU-CHEK FASTCLIX LANCETS MISC: 1.0000 | Freq: Four times a day (QID) | 12 refills | Status: DC

## 2018-05-20 NOTE — Telephone Encounter (Signed)
New order sent in with Bev's help. Called [facility] and left message we have sent in a new order. Let us know if it is not correct order.

## 2018-05-27 ENCOUNTER — Ambulatory Visit: Payer: Medicaid Other | Admitting: Advanced Practice Midwife

## 2018-05-29 ENCOUNTER — Emergency Department (HOSPITAL_COMMUNITY): Payer: Medicaid Other

## 2018-05-29 ENCOUNTER — Emergency Department (HOSPITAL_COMMUNITY)
Admission: EM | Admit: 2018-05-29 | Discharge: 2018-05-29 | Disposition: A | Discharge status: Home / Self Care | Payer: Medicaid Other | Attending: Emergency Medicine | Admitting: Emergency Medicine

## 2018-05-29 ENCOUNTER — Encounter (HOSPITAL_COMMUNITY): Payer: Self-pay

## 2018-05-29 ENCOUNTER — Other Ambulatory Visit: Payer: Self-pay

## 2018-05-29 DIAGNOSIS — E119 Type 2 diabetes mellitus without complications: Secondary | ICD-10-CM | POA: Insufficient documentation

## 2018-05-29 DIAGNOSIS — R079 Chest pain, unspecified: Secondary | ICD-10-CM | POA: Diagnosis not present

## 2018-05-29 DIAGNOSIS — I10 Essential (primary) hypertension: Secondary | ICD-10-CM | POA: Diagnosis not present

## 2018-05-29 DIAGNOSIS — Z87891 Personal history of nicotine dependence: Secondary | ICD-10-CM | POA: Insufficient documentation

## 2018-05-29 DIAGNOSIS — Z79899 Other long term (current) drug therapy: Secondary | ICD-10-CM | POA: Diagnosis not present

## 2018-05-29 LAB — COMPREHENSIVE METABOLIC PANEL
ALT: 15 U/L (ref 0–44)
ANION GAP: 9 (ref 5–15)
AST: 21 U/L (ref 15–41)
Albumin: 4.2 g/dL (ref 3.5–5.0)
Alkaline Phosphatase: 65 U/L (ref 38–126)
BUN: 15 mg/dL (ref 6–20)
CALCIUM: 9.3 mg/dL (ref 8.9–10.3)
CO2: 22 mmol/L (ref 22–32)
Chloride: 104 mmol/L (ref 98–111)
Creatinine, Ser: 0.74 mg/dL (ref 0.44–1.00)
GFR calc Af Amer: >60 mL/min (ref >60)
GFR calc non Af Amer: >60 mL/min (ref >60)
Glucose, Bld: 202 mg/dL — ABNORMAL HIGH (ref 70–99)
Potassium: 4.3 mmol/L (ref 3.5–5.1)
Sodium: 135 mmol/L (ref 135–145)
Total Bilirubin: 0.5 mg/dL (ref 0.3–1.2)
Total Protein: 8 g/dL (ref 6.5–8.1)

## 2018-05-29 LAB — CBC WITH DIFFERENTIAL/PLATELET
Abs Immature Granulocytes: 0.02 10*3/uL (ref 0.00–0.07)
Basophils Absolute: 0 10*3/uL (ref 0.0–0.1)
Basophils Relative: 0 %
EOS PCT: 3 %
Eosinophils Absolute: 0.2 10*3/uL (ref 0.0–0.5)
HCT: 42 % (ref 36.0–46.0)
Hemoglobin: 12.9 g/dL (ref 12.0–15.0)
Immature Granulocytes: 0 %
LYMPHS PCT: 18 %
Lymphs Abs: 1.3 10*3/uL (ref 0.7–4.0)
MCH: 24 pg — ABNORMAL LOW (ref 26.0–34.0)
MCHC: 30.7 g/dL (ref 30.0–36.0)
MCV: 78.1 fL — ABNORMAL LOW (ref 80.0–100.0)
Monocytes Absolute: 0.4 10*3/uL (ref 0.1–1.0)
Monocytes Relative: 5 %
Neutro Abs: 5.4 10*3/uL (ref 1.7–7.7)
Neutrophils Relative %: 74 %
Platelets: 267 10*3/uL (ref 150–400)
RBC: 5.38 MIL/uL — AB (ref 3.87–5.11)
RDW: 15 % (ref 11.5–15.5)
WBC: 7.3 10*3/uL (ref 4.0–10.5)
nRBC: 0 % (ref 0.0–0.2)

## 2018-05-29 LAB — I-STAT BETA HCG BLOOD, ED (MC, WL, AP ONLY): I-stat hCG, quantitative: <5 m[IU]/mL (ref <5)

## 2018-05-29 LAB — D-DIMER, QUANTITATIVE: D-Dimer, Quant: 0.61 ug/mL-FEU — ABNORMAL HIGH (ref 0.00–0.50)

## 2018-05-29 LAB — I-STAT TROPONIN, ED: Troponin i, poc: 0.01 ng/mL (ref 0.00–0.08)

## 2018-05-29 MED ORDER — SODIUM CHLORIDE (PF) 0.9 % IJ SOLN
INTRAMUSCULAR | Status: AC
  Filled 2018-05-29: qty 50

## 2018-05-29 MED ORDER — IOPAMIDOL (ISOVUE-370) INJECTION 76%
INTRAVENOUS | Status: AC
  Administered 2018-05-29: 100 mL via INTRAVENOUS
  Filled 2018-05-29: qty 100

## 2018-05-29 MED ORDER — IOPAMIDOL (ISOVUE-370) INJECTION 76%
100.0000 mL | Freq: Once | INTRAVENOUS | Status: AC | PRN
  Administered 2018-05-29: 100 mL via INTRAVENOUS

## 2018-05-29 NOTE — ED Triage Notes (Signed)
Patient c/o intermittent chest pain x 3 days. Chest pain radiated to her left jaw yesterday. Patient states she has had heart palpitations in the past. Patient denies any N/V or SOB.

## 2018-05-29 NOTE — Discharge Instructions (Addendum)
Take tylenol, motrin for pain  ° °See your doctor  ° °Return to ER if you have worse chest pain, shortness of breath.  °

## 2018-05-29 NOTE — ED Notes (Signed)
Pt ambulatory to XR

## 2018-05-29 NOTE — ED Provider Notes (Signed)
[facility] DEPT Provider Note   CSN: [id number] Arrival date & time: 05/29/18  1001     History   Chief Complaint Chief Complaint  Patient presents with  . Chest Pain    HPI Ellouise Mcwhirter is a 32 y.o. female hx of diabetes, HTN, who presented with chest pain, left jaw pain.  Patient states that she has been having intermittent left-sided chest pain to her jaw for the last 3 days.  It is not worse with exertion or movement.  She had occasional palpitations but denies any shortness of breath.  She denies any recent travel or history of blood clots.  Denies any cardiac history.  Of note, patient recently had a stillbirth and subsequently was septic and was admitted for IV antibiotics.  Patient denies any fevers currently.  The history is provided by the patient.    Past Medical History:  Diagnosis Date  . Diabetes mellitus without complication ([place])    T2  . Hypertension   . Palpitations     Patient Active Problem List   Diagnosis Date Noted  . Pyuria 03/30/2018  . DM (diabetes mellitus), type 2 ([place]) 09/21/2016  . Benign essential HTN 09/21/2016  . Obesity (BMI 30-39.9) 01/08/2013    Past Surgical History:  Procedure Laterality Date  . CESAREAN SECTION N/A 02/20/2014   Procedure: CESAREAN SECTION;  Surgeon: Florian Buff, MD;  Location: [facility];  Service: Obstetrics;  Laterality: N/A;  . CESAREAN SECTION N/A 07/28/2016   Procedure: CESAREAN SECTION;  Surgeon: Mora Bellman, MD;  Location: [facility];  Service: Obstetrics;  Laterality: N/A;     OB History    Gravida  3   Para  3   Term  2   Preterm  1   AB  0   Living  2     SAB  0   TAB  0   Ectopic  0   Multiple  0   Live Births  2            Home Medications    Prior to Admission medications   Medication Sig Start Date End Date Taking? Authorizing Provider  enalapril (VASOTEC) 10 MG tablet Take 1 tablet (10 mg total) by mouth daily. 03/24/18  Yes  Sloan Leiter, MD  ibuprofen (ADVIL,MOTRIN) 600 MG tablet Take 1 tablet (600 mg total) by mouth every 6 (six) hours as needed. 03/23/18  Yes Sloan Leiter, MD  insulin glargine (LANTUS) 100 UNIT/ML injection Inject 0.08 mLs (8 Units total) into the skin at bedtime. 03/23/18  Yes Sloan Leiter, MD  metFORMIN (GLUCOPHAGE) 1000 MG tablet Take 1 tablet (1,000 mg total) by mouth 2 (two) times daily with a meal. 03/23/18  Yes Sloan Leiter, MD  triamterene-hydrochlorothiazide (MAXZIDE-25) 37.5-25 MG tablet Take 1 tablet by mouth daily. 04/23/18  Yes Donnamae Jude, MD  ACCU-CHEK FASTCLIX LANCETS MISC 1 Device by Percutaneous route 4 (four) times daily. 05/20/18   Donnamae Jude, MD  Blood Glucose Monitoring Suppl (ACCU-CHEK NANO SMARTVIEW) w/Device KIT 1 kit by Subdermal route as directed. Check blood sugars for fasting, and two hours after breakfast, lunch and dinner (4 checks daily) 01/22/18   Caren Macadam, MD  glucose blood (ACCU-CHEK SMARTVIEW) test strip Use as instructed to check blood sugars 01/22/18   Caren Macadam, MD    Family History Family History  Problem Relation Age of Onset  . Diabetes Mellitus I Mother   . Hypertension  Mother   . Diabetes Mother   . Hypertension Father   . Cancer Maternal Grandmother   . Cancer Maternal Grandfather   . Cancer Paternal Grandmother   . Cancer Paternal Grandfather   . Early death Neg Hx   . Heart disease Neg Hx   . Hyperlipidemia Neg Hx   . Kidney disease Neg Hx   . Stroke Neg Hx     Social History Social History   Tobacco Use  . Smoking status: Former Smoker    Packs/day: 0.00    Years: 0.00    Pack years: 0.00    Last attempt to quit: 06/11/2009    Years since quitting: 8.9  . Smokeless tobacco: Never Used  . Tobacco comment: not while pregnant  Substance Use Topics  . Alcohol use: Not Currently    Alcohol/week: 4.0 standard drinks    Types: 2 Glasses of wine, 2 Cans of beer per week    Comment: occasional;  stopped after found out she was pregnant  . Drug use: No    Comment: per prenatal record, +THC on initial visit u/a; no current use     Allergies   Patient has no known allergies.   Review of Systems Review of Systems  Cardiovascular: Positive for chest pain.  All other systems reviewed and are negative.    Physical Exam Updated Vital Signs BP (!) 123/94   Pulse 91   Temp 98.3 F (36.8 C) (Oral)   Resp 16   Ht 5' 10"  (1.778 m)   Wt 104.3 kg   LMP 05/01/2018   SpO2 99%   BMI 33.00 kg/m   Physical Exam Vitals signs and nursing note reviewed.  Constitutional:      Appearance: She is well-developed.  HENT:     Head: Normocephalic.  Eyes:     Pupils: Pupils are equal, round, and reactive to light.  Neck:     Musculoskeletal: Normal range of motion and neck supple.  Cardiovascular:     Rate and Rhythm: Normal rate and regular rhythm.     Heart sounds: Normal heart sounds.  Pulmonary:     Effort: Pulmonary effort is normal.     Breath sounds: Normal breath sounds.  Abdominal:     General: Bowel sounds are normal.     Palpations: Abdomen is soft.  Musculoskeletal: Normal range of motion.     Right lower leg: She exhibits no tenderness. No edema.     Left lower leg: She exhibits no tenderness. No edema.  Skin:    General: Skin is warm.     Capillary Refill: Capillary refill takes less than 2 seconds.  Neurological:     General: No focal deficit present.     Mental Status: She is alert and oriented to person, place, and time.  Psychiatric:        Mood and Affect: Mood normal.        Behavior: Behavior normal.      ED Treatments / Results  Labs (all labs ordered are listed, but only abnormal results are displayed) Labs Reviewed  CBC WITH DIFFERENTIAL/PLATELET - Abnormal; Notable for the following components:      Result Value   RBC 5.38 (*)    MCV 78.1 (*)    MCH 24.0 (*)    All other components within normal limits  COMPREHENSIVE METABOLIC PANEL -  Abnormal; Notable for the following components:   Glucose, Bld 202 (*)    All other components within normal limits  D-DIMER, QUANTITATIVE (NOT AT [facility]) - Abnormal; Notable for the following components:   D-Dimer, Quant 0.61 (*)    All other components within normal limits  I-STAT TROPONIN, ED  I-STAT BETA HCG BLOOD, ED ([facility], [facility], [facility] ONLY)    EKG EKG Interpretation  Date/Time:  Thursday May 29 2018 10:09:12 EST Ventricular Rate:  102 PR Interval:    QRS Duration: 105 QT Interval:  328 QTC Calculation: 428 R Axis:   16 Text Interpretation:  Sinus tachycardia No significant change since last tracing Confirmed by Wandra Arthurs [phone]) on 05/29/2018 10:35:15 AM   Radiology Dg Chest 2 View  Result Date: 05/29/2018 CLINICAL DATA:  Intermittent chest pain. EXAM: CHEST - 2 VIEW COMPARISON:  03/30/2018. FINDINGS: The heart size and mediastinal contours are within normal limits. Both lungs are clear. The visualized skeletal structures are unremarkable. IMPRESSION: No active cardiopulmonary disease. Electronically Signed   By: Staci Righter M.D.   On: 05/29/2018 10:32   Ct Angio Chest Pe W And/or Wo Contrast  Result Date: 05/29/2018 CLINICAL DATA:  Chest pain EXAM: CT ANGIOGRAPHY CHEST WITH CONTRAST TECHNIQUE: Multidetector CT imaging of the chest was performed using the standard protocol during bolus administration of intravenous contrast. Multiplanar CT image reconstructions and MIPs were obtained to evaluate the vascular anatomy. CONTRAST:  170m ISOVUE-370 IOPAMIDOL (ISOVUE-370) INJECTION 76% COMPARISON:  Chest radiograph May 29, 2018 FINDINGS: Cardiovascular: There is no demonstrable pulmonary embolus. There is no thoracic aortic aneurysm or dissection. The visualized great vessels appear unremarkable. No pericardial effusion or pericardial thickening evident. The main pulmonary outflow tract is borderline prominent at 3.0 cm diameter. Mediastinum/Nodes: Visualized thyroid appears  unremarkable. There is no appreciable thoracic adenopathy. No esophageal lesions are evident. Lungs/Pleura: There is no evident edema or consolidation. No pleural effusion or pleural thickening appreciable. Upper Abdomen: Visualized upper abdominal structures appear unremarkable. Musculoskeletal: There are no blastic or lytic bone lesions. No chest wall lesions are evident. Review of the MIP images confirms the above findings. IMPRESSION: 1. No demonstrable pulmonary embolus. There is no thoracic aortic aneurysm or dissection. 2. borderline prominent main pulmonary outflow tract. Question a degree of underlying pulmonary arterial hypertension. 3.  Lungs clear. 4.  No evident thoracic adenopathy. Electronically Signed   By: WLowella GripIII M.D.   On: 05/29/2018 11:56    Procedures Procedures (including critical care time)  Medications Ordered in ED Medications  sodium chloride (PF) 0.9 % injection (has no administration in time range)  iopamidol (ISOVUE-370) 76 % injection 100 mL (100 mLs Intravenous Contrast Given 05/29/18 1136)     Initial Impression / Assessment and Plan / ED Course  I have reviewed the triage vital signs and the nursing notes.  Pertinent labs & imaging results that were available during my care of the patient were reviewed by me and considered in my medical decision making (see chart for details).    BJenelle Drennonis a 32y.o. female here with chest pain. Low risk for ACS and symptoms for several days so trop x 1 sufficient. Patient has sinus tachycardia on arrival so will get d-dimer, labs, CXR.   12:05 PM Labs showed slightly elevated d-dimer. CTA showed no PE. Stable for discharge.      Final Clinical Impressions(s) / ED Diagnoses   Final diagnoses:  None    ED Discharge Orders    None       YDrenda Freeze MD 05/29/18 1206

## 2018-08-20 DIAGNOSIS — I10 Essential (primary) hypertension: Secondary | ICD-10-CM | POA: Diagnosis not present

## 2018-08-20 DIAGNOSIS — N915 Oligomenorrhea, unspecified: Secondary | ICD-10-CM | POA: Diagnosis not present

## 2018-08-20 DIAGNOSIS — R002 Palpitations: Secondary | ICD-10-CM | POA: Diagnosis not present

## 2018-08-20 DIAGNOSIS — E1165 Type 2 diabetes mellitus with hyperglycemia: Secondary | ICD-10-CM | POA: Diagnosis not present

## 2018-08-22 DIAGNOSIS — E1165 Type 2 diabetes mellitus with hyperglycemia: Secondary | ICD-10-CM | POA: Diagnosis not present

## 2018-10-22 DIAGNOSIS — R002 Palpitations: Secondary | ICD-10-CM | POA: Diagnosis not present

## 2018-10-22 DIAGNOSIS — R0602 Shortness of breath: Secondary | ICD-10-CM | POA: Diagnosis not present

## 2018-10-24 ENCOUNTER — Telehealth (INDEPENDENT_AMBULATORY_CARE_PROVIDER_SITE_OTHER): Payer: Medicaid Other | Admitting: *Deleted

## 2018-10-24 DIAGNOSIS — E119 Type 2 diabetes mellitus without complications: Secondary | ICD-10-CM

## 2018-10-24 DIAGNOSIS — Z794 Long term (current) use of insulin: Secondary | ICD-10-CM

## 2018-10-24 NOTE — Telephone Encounter (Signed)
Joyce Green called this am and left a voicemail she was our patient last October and Dr Earlene Plater gave her rx for metformin.  States she ran out of it 9 days ago and rx says good until October but only had 3 refills and is out.  [facility] pharmacy sent request but hasn't heard anything from our office.   I called [name] and we discussed she was  Postpartum in October and she should be managed by her PCP for her Diabetes Type 2. She states since she left the message she reached out to her Doctors office - [facility] and they did see her in March but since she had metfformin they did not refill it.  States she has appointment Sunday to get refilled then ; unless they call her back and have refilled it- states they are giving her the runaround . I explained at this point we will let her PCP manage her diabetes but we can reach out next week to make sure she got a refill.  She voices understanding. I also discussed with Dr.Ervin and he agrees with plan of care.

## 2018-10-26 DIAGNOSIS — I10 Essential (primary) hypertension: Secondary | ICD-10-CM | POA: Diagnosis not present

## 2018-10-26 DIAGNOSIS — E119 Type 2 diabetes mellitus without complications: Secondary | ICD-10-CM | POA: Diagnosis not present

## 2018-10-26 DIAGNOSIS — R5383 Other fatigue: Secondary | ICD-10-CM | POA: Diagnosis not present

## 2018-10-26 DIAGNOSIS — N915 Oligomenorrhea, unspecified: Secondary | ICD-10-CM | POA: Diagnosis not present

## 2018-10-26 DIAGNOSIS — E559 Vitamin D deficiency, unspecified: Secondary | ICD-10-CM | POA: Diagnosis not present

## 2018-10-26 DIAGNOSIS — Z79899 Other long term (current) drug therapy: Secondary | ICD-10-CM | POA: Diagnosis not present

## 2018-10-26 DIAGNOSIS — E7800 Pure hypercholesterolemia, unspecified: Secondary | ICD-10-CM | POA: Diagnosis not present

## 2018-10-27 NOTE — Telephone Encounter (Signed)
Called pt to follow up on her need for a primary care provider.  Pt states she has an appointment scheduled now and expressed thanks for the check in.

## 2018-10-30 DIAGNOSIS — I7389 Other specified peripheral vascular diseases: Secondary | ICD-10-CM | POA: Diagnosis not present

## 2018-10-30 DIAGNOSIS — I739 Peripheral vascular disease, unspecified: Secondary | ICD-10-CM | POA: Diagnosis not present

## 2018-10-30 DIAGNOSIS — E119 Type 2 diabetes mellitus without complications: Secondary | ICD-10-CM | POA: Diagnosis not present

## 2018-10-30 DIAGNOSIS — R002 Palpitations: Secondary | ICD-10-CM | POA: Diagnosis not present

## 2018-10-31 ENCOUNTER — Other Ambulatory Visit (HOSPITAL_COMMUNITY): Payer: Self-pay | Admitting: Family Medicine

## 2018-10-31 ENCOUNTER — Other Ambulatory Visit: Payer: Self-pay | Admitting: Family Medicine

## 2018-11-04 ENCOUNTER — Other Ambulatory Visit: Payer: Self-pay | Admitting: Family Medicine

## 2018-11-04 ENCOUNTER — Other Ambulatory Visit (HOSPITAL_COMMUNITY): Payer: Self-pay | Admitting: Family Medicine

## 2018-11-04 DIAGNOSIS — E119 Type 2 diabetes mellitus without complications: Secondary | ICD-10-CM | POA: Diagnosis not present

## 2018-11-04 DIAGNOSIS — I10 Essential (primary) hypertension: Secondary | ICD-10-CM | POA: Diagnosis not present

## 2018-11-04 DIAGNOSIS — E7800 Pure hypercholesterolemia, unspecified: Secondary | ICD-10-CM | POA: Diagnosis not present

## 2018-11-04 DIAGNOSIS — Z794 Long term (current) use of insulin: Secondary | ICD-10-CM

## 2018-11-05 ENCOUNTER — Other Ambulatory Visit (HOSPITAL_COMMUNITY): Payer: Self-pay | Admitting: Family Medicine

## 2018-11-05 DIAGNOSIS — R0602 Shortness of breath: Secondary | ICD-10-CM

## 2018-11-05 DIAGNOSIS — E119 Type 2 diabetes mellitus without complications: Secondary | ICD-10-CM

## 2018-11-11 ENCOUNTER — Other Ambulatory Visit: Payer: Self-pay

## 2018-11-11 ENCOUNTER — Emergency Department (HOSPITAL_COMMUNITY)
Admission: EM | Admit: 2018-11-11 | Discharge: 2018-11-11 | Disposition: A | Discharge status: Home / Self Care | Payer: 59 | Attending: Emergency Medicine | Admitting: Emergency Medicine

## 2018-11-11 ENCOUNTER — Encounter (HOSPITAL_COMMUNITY): Payer: Self-pay | Admitting: Emergency Medicine

## 2018-11-11 DIAGNOSIS — Z87891 Personal history of nicotine dependence: Secondary | ICD-10-CM | POA: Diagnosis not present

## 2018-11-11 DIAGNOSIS — E119 Type 2 diabetes mellitus without complications: Secondary | ICD-10-CM | POA: Insufficient documentation

## 2018-11-11 DIAGNOSIS — Z794 Long term (current) use of insulin: Secondary | ICD-10-CM | POA: Diagnosis not present

## 2018-11-11 DIAGNOSIS — I10 Essential (primary) hypertension: Secondary | ICD-10-CM | POA: Insufficient documentation

## 2018-11-11 DIAGNOSIS — R51 Headache: Secondary | ICD-10-CM | POA: Diagnosis present

## 2018-11-11 DIAGNOSIS — Z79899 Other long term (current) drug therapy: Secondary | ICD-10-CM | POA: Insufficient documentation

## 2018-11-11 DIAGNOSIS — I160 Hypertensive urgency: Secondary | ICD-10-CM | POA: Diagnosis not present

## 2018-11-11 LAB — CBC WITH DIFFERENTIAL/PLATELET
Abs Immature Granulocytes: 0.02 10*3/uL (ref 0.00–0.07)
Basophils Absolute: 0 10*3/uL (ref 0.0–0.1)
Basophils Relative: 0 %
Eosinophils Absolute: 0.2 10*3/uL (ref 0.0–0.5)
Eosinophils Relative: 2 %
HCT: 37.5 % (ref 36.0–46.0)
Hemoglobin: 11.8 g/dL — ABNORMAL LOW (ref 12.0–15.0)
Immature Granulocytes: 0 %
Lymphocytes Relative: 18 %
Lymphs Abs: 1.3 10*3/uL (ref 0.7–4.0)
MCH: 25.5 pg — ABNORMAL LOW (ref 26.0–34.0)
MCHC: 31.5 g/dL (ref 30.0–36.0)
MCV: 81.2 fL (ref 80.0–100.0)
Monocytes Absolute: 0.5 10*3/uL (ref 0.1–1.0)
Monocytes Relative: 6 %
Neutro Abs: 5.3 10*3/uL (ref 1.7–7.7)
Neutrophils Relative %: 74 %
Platelets: 242 10*3/uL (ref 150–400)
RBC: 4.62 MIL/uL (ref 3.87–5.11)
RDW: 14.4 % (ref 11.5–15.5)
WBC: 7.2 10*3/uL (ref 4.0–10.5)
nRBC: 0 % (ref 0.0–0.2)

## 2018-11-11 LAB — COMPREHENSIVE METABOLIC PANEL
ALT: 11 U/L (ref 0–44)
AST: 17 U/L (ref 15–41)
Albumin: 3.7 g/dL (ref 3.5–5.0)
Alkaline Phosphatase: 70 U/L (ref 38–126)
Anion gap: 11 (ref 5–15)
BUN: 12 mg/dL (ref 6–20)
CO2: 19 mmol/L — ABNORMAL LOW (ref 22–32)
Calcium: 8.7 mg/dL — ABNORMAL LOW (ref 8.9–10.3)
Chloride: 105 mmol/L (ref 98–111)
Creatinine, Ser: 0.66 mg/dL (ref 0.44–1.00)
GFR calc Af Amer: >60 mL/min (ref >60)
GFR calc non Af Amer: >60 mL/min (ref >60)
Glucose, Bld: 196 mg/dL — ABNORMAL HIGH (ref 70–99)
Potassium: 3.7 mmol/L (ref 3.5–5.1)
Sodium: 135 mmol/L (ref 135–145)
Total Bilirubin: 0.2 mg/dL — ABNORMAL LOW (ref 0.3–1.2)
Total Protein: 7.2 g/dL (ref 6.5–8.1)

## 2018-11-11 LAB — URINALYSIS, ROUTINE W REFLEX MICROSCOPIC
Bilirubin Urine: NEGATIVE
Glucose, UA: NEGATIVE mg/dL
Ketones, ur: NEGATIVE mg/dL
Leukocytes,Ua: NEGATIVE
Nitrite: NEGATIVE
Protein, ur: NEGATIVE mg/dL
Specific Gravity, Urine: 1.004 — ABNORMAL LOW (ref 1.005–1.030)
pH: 7 (ref 5.0–8.0)

## 2018-11-11 LAB — ETHANOL: Alcohol, Ethyl (B): <10 mg/dL (ref <10)

## 2018-11-11 NOTE — ED Triage Notes (Signed)
Per pt, states she was placed on Lisinopril about a month ago-states today she has been a lot of stress at work-states BP has never been this hight-states she is a "worrier"-complaining of a slight headache

## 2018-11-11 NOTE — Discharge Instructions (Addendum)
As discussed, your evaluation today has been largely reassuring.  But, it is important that you monitor your condition carefully, and do not hesitate to return to the ED if you develop new, or concerning changes in your condition. ? ?Otherwise, please follow-up with your physician for appropriate ongoing care. ? ?

## 2018-11-11 NOTE — ED Provider Notes (Signed)
[facility] DEPT Provider Note   CSN: [id number] Arrival date & time: 11/11/18  1726    History   Chief Complaint Chief Complaint  Patient presents with  . Hypertension    HPI Joyce Green is a 33 y.o. female.     HPI Presents concern of headache, now resolved. Patient has multiple medical issues including diabetes, hypertension, heightened anxiety She also had a nearly full-term stillbirth within the past 6 months. She does have headaches, with some frequency, and today, while working, she had a left-sided sharp severe nonradiating headache. This is since resolved, with rest. However, she notes that during her headache she found her blood pressure be substantially elevated, greater than 277 systolic. She has been taking her medication recently, though she does note that in spite of being prescribed insulin, she is hesitant to take this, attempting to control her diabetes other ways. Past Medical History:  Diagnosis Date  . Diabetes mellitus without complication ([place])    T2  . Hypertension   . Palpitations     Patient Active Problem List   Diagnosis Date Noted  . Pyuria 03/30/2018  . DM (diabetes mellitus), type 2 ([place]) 09/21/2016  . Benign essential HTN 09/21/2016  . Obesity (BMI 30-39.9) 01/08/2013    Past Surgical History:  Procedure Laterality Date  . CESAREAN SECTION N/A 02/20/2014   Procedure: CESAREAN SECTION;  Surgeon: Florian Buff, MD;  Location: [facility];  Service: Obstetrics;  Laterality: N/A;  . CESAREAN SECTION N/A 07/28/2016   Procedure: CESAREAN SECTION;  Surgeon: Mora Bellman, MD;  Location: [facility];  Service: Obstetrics;  Laterality: N/A;     OB History    Gravida  3   Para  3   Term  2   Preterm  1   AB  0   Living  2     SAB  0   TAB  0   Ectopic  0   Multiple  0   Live Births  2            Home Medications    Prior to Admission medications   Medication Sig Start Date End  Date Taking? Authorizing Provider  LANTUS SOLOSTAR 100 UNIT/ML Solostar Pen Inject 8 Units into the skin at bedtime.  09/20/18  Yes [provider]  lisinopril (ZESTRIL) 2.5 MG tablet Take 2.5 mg by mouth daily. 09/20/18  Yes [provider]  magnesium 30 MG tablet Take 30 mg by mouth daily.   Yes [provider]  metFORMIN (GLUCOPHAGE) 1000 MG tablet Take 1 tablet (1,000 mg total) by mouth 2 (two) times daily with a meal. 03/23/18  Yes Sloan Leiter, MD  pravastatin (PRAVACHOL) 10 MG tablet Take 10 mg by mouth daily.  09/20/18  Yes [provider]  ACCU-CHEK FASTCLIX LANCETS MISC 1 Device by Percutaneous route 4 (four) times daily. 05/20/18   Donnamae Jude, MD  Blood Glucose Monitoring Suppl (ACCU-CHEK NANO SMARTVIEW) w/Device KIT 1 kit by Subdermal route as directed. Check blood sugars for fasting, and two hours after breakfast, lunch and dinner (4 checks daily) 01/22/18   Caren Macadam, MD  enalapril (VASOTEC) 10 MG tablet Take 1 tablet (10 mg total) by mouth daily. Patient not taking: Reported on 11/11/2018 03/24/18   Sloan Leiter, MD  glucose blood (ACCU-CHEK SMARTVIEW) test strip Use as instructed to check blood sugars 01/22/18   Caren Macadam, MD  ibuprofen (ADVIL,MOTRIN) 600 MG tablet Take 1 tablet (  600 mg total) by mouth every 6 (six) hours as needed. Patient not taking: Reported on 11/11/2018 03/23/18   Sloan Leiter, MD  insulin glargine (LANTUS) 100 UNIT/ML injection Inject 0.08 mLs (8 Units total) into the skin at bedtime. Patient not taking: Reported on 11/11/2018 03/23/18   Sloan Leiter, MD  triamterene-hydrochlorothiazide (MAXZIDE-25) 37.5-25 MG tablet Take 1 tablet by mouth daily. Patient not taking: Reported on 11/11/2018 04/23/18   Donnamae Jude, MD  Vitamin D, Ergocalciferol, (DRISDOL) 1.25 MG (50000 UT) CAPS capsule Take 50,000 Units by mouth once a week. 08/20/18   [provider]    Family History Family History   Problem Relation Age of Onset  . Diabetes Mellitus I Mother   . Hypertension Mother   . Diabetes Mother   . Hypertension Father   . Cancer Maternal Grandmother   . Cancer Maternal Grandfather   . Cancer Paternal Grandmother   . Cancer Paternal Grandfather   . Early death Neg Hx   . Heart disease Neg Hx   . Hyperlipidemia Neg Hx   . Kidney disease Neg Hx   . Stroke Neg Hx     Social History Social History   Tobacco Use  . Smoking status: Former Smoker    Packs/day: 0.00    Years: 0.00    Pack years: 0.00    Last attempt to quit: 06/11/2009    Years since quitting: 9.4  . Smokeless tobacco: Never Used  . Tobacco comment: not while pregnant  Substance Use Topics  . Alcohol use: Not Currently    Alcohol/week: 4.0 standard drinks    Types: 2 Glasses of wine, 2 Cans of beer per week    Comment: occasional; stopped after found out she was pregnant  . Drug use: No    Comment: per prenatal record, +THC on initial visit u/a; no current use     Allergies   Patient has no known allergies.   Review of Systems Review of Systems  Constitutional:       Per HPI, otherwise negative  HENT:       Per HPI, otherwise negative  Respiratory:       Per HPI, otherwise negative  Cardiovascular:       Per HPI, otherwise negative  Gastrointestinal: Negative for vomiting.  Endocrine:       Negative aside from HPI  Genitourinary:       Neg aside from HPI   Musculoskeletal:       Per HPI, otherwise negative  Skin: Negative.   Neurological: Positive for headaches. Negative for syncope.     Physical Exam Updated Vital Signs BP (!) 154/106   Pulse 79   Temp 98.9 F (37.2 C) (Oral)   Resp 17   SpO2 97%   Physical Exam Vitals signs and nursing note reviewed.  Constitutional:      General: She is not in acute distress.    Appearance: She is well-developed.  HENT:     Head: Normocephalic and atraumatic.  Eyes:     Conjunctiva/sclera: Conjunctivae normal.  Cardiovascular:      Rate and Rhythm: Normal rate and regular rhythm.  Pulmonary:     Effort: Pulmonary effort is normal. No respiratory distress.     Breath sounds: Normal breath sounds. No stridor.  Abdominal:     General: There is no distension.  Skin:    General: Skin is warm and dry.  Neurological:     Mental Status: She is alert and  oriented to person, place, and time.     Cranial Nerves: No cranial nerve deficit.      ED Treatments / Results  Labs (all labs ordered are listed, but only abnormal results are displayed) Labs Reviewed  COMPREHENSIVE METABOLIC PANEL - Abnormal; Notable for the following components:      Result Value   CO2 19 (*)    Glucose, Bld 196 (*)    Calcium 8.7 (*)    Total Bilirubin 0.2 (*)    All other components within normal limits  URINALYSIS, ROUTINE W REFLEX MICROSCOPIC - Abnormal; Notable for the following components:   Color, Urine STRAW (*)    Specific Gravity, Urine 1.004 (*)    Hgb urine dipstick SMALL (*)    Bacteria, UA RARE (*)    All other components within normal limits  CBC WITH DIFFERENTIAL/PLATELET - Abnormal; Notable for the following components:   Hemoglobin 11.8 (*)    MCH 25.5 (*)    All other components within normal limits  ETHANOL  CBC WITH DIFFERENTIAL/PLATELET  POC URINE PREG, ED    EKG None  Radiology No results found.  Procedures Procedures (including critical care time)  Medications Ordered in ED Medications - No data to display   Initial Impression / Assessment and Plan / ED Course  I have reviewed the triage vital signs and the nursing notes.  Pertinent labs & imaging results that were available during my care of the patient were reviewed by me and considered in my medical decision making (see chart for details).        8:19 PM Patient in no distress, awake, alert, speaking clearly We lengthy conversation about today's findings, concern for hyperglycemia Blood pressure is doing substantially since maximum of 940  systolic down to 905, and with this decrease, absence of other alarming findings, after long conversation on the need for medication compliance, outpatient follow-up, the patient was discharged in stable condition.  Final Clinical Impressions(s) / ED Diagnoses   Final diagnoses:  Hypertensive urgency     Carmin Muskrat, MD 11/11/18 2020

## 2018-11-12 ENCOUNTER — Other Ambulatory Visit: Payer: Self-pay

## 2018-11-12 ENCOUNTER — Encounter (HOSPITAL_COMMUNITY): Payer: Self-pay

## 2018-11-12 ENCOUNTER — Ambulatory Visit (HOSPITAL_COMMUNITY)
Admission: RE | Admit: 2018-11-12 | Discharge: 2018-11-12 | Disposition: A | Discharge status: Home / Self Care | Payer: 59 | Source: Ambulatory Visit | Attending: Family Medicine | Admitting: Family Medicine

## 2018-11-12 DIAGNOSIS — Z794 Long term (current) use of insulin: Secondary | ICD-10-CM

## 2018-11-12 DIAGNOSIS — R0602 Shortness of breath: Secondary | ICD-10-CM | POA: Diagnosis not present

## 2018-11-12 DIAGNOSIS — E119 Type 2 diabetes mellitus without complications: Secondary | ICD-10-CM | POA: Insufficient documentation

## 2018-11-12 LAB — NM MYOCAR MULTI W/SPECT W/WALL MOTION / EF
Estimated workload: 1 METS
Exercise duration (min): 5 min
Exercise duration (sec): 19 s
Peak HR: 127 {beats}/min
Rest HR: 85 {beats}/min

## 2018-11-12 MED ORDER — TECHNETIUM TC 99M TETROFOSMIN IV KIT
30.0000 | PACK | Freq: Once | INTRAVENOUS | Status: AC | PRN
  Administered 2018-11-12: 30 via INTRAVENOUS

## 2018-11-12 MED ORDER — REGADENOSON 0.4 MG/5ML IV SOLN
0.4000 mg | Freq: Once | INTRAVENOUS | Status: AC
  Administered 2018-11-12: 0.4 mg via INTRAVENOUS

## 2018-11-12 MED ORDER — REGADENOSON 0.4 MG/5ML IV SOLN
INTRAVENOUS | Status: AC
  Administered 2018-11-12: 12:00:00 0.4 mg via INTRAVENOUS
  Filled 2018-11-12: qty 5

## 2018-11-12 MED ORDER — TECHNETIUM TC 99M TETROFOSMIN IV KIT
10.0000 | PACK | Freq: Once | INTRAVENOUS | Status: AC | PRN
  Administered 2018-11-12: 10 via INTRAVENOUS

## 2018-11-19 DIAGNOSIS — M79675 Pain in left toe(s): Secondary | ICD-10-CM | POA: Diagnosis not present

## 2018-11-19 DIAGNOSIS — M79674 Pain in right toe(s): Secondary | ICD-10-CM | POA: Diagnosis not present

## 2018-11-19 DIAGNOSIS — E119 Type 2 diabetes mellitus without complications: Secondary | ICD-10-CM | POA: Diagnosis not present

## 2018-11-19 DIAGNOSIS — B351 Tinea unguium: Secondary | ICD-10-CM | POA: Diagnosis not present

## 2019-04-18 ENCOUNTER — Emergency Department (HOSPITAL_COMMUNITY): Payer: 59

## 2019-04-18 ENCOUNTER — Emergency Department (HOSPITAL_COMMUNITY)
Admission: EM | Admit: 2019-04-18 | Discharge: 2019-04-18 | Disposition: A | Discharge status: Home / Self Care | Payer: 59 | Attending: Emergency Medicine | Admitting: Emergency Medicine

## 2019-04-18 ENCOUNTER — Other Ambulatory Visit: Payer: Self-pay

## 2019-04-18 ENCOUNTER — Encounter (HOSPITAL_COMMUNITY): Payer: Self-pay

## 2019-04-18 DIAGNOSIS — Z87891 Personal history of nicotine dependence: Secondary | ICD-10-CM | POA: Insufficient documentation

## 2019-04-18 DIAGNOSIS — I10 Essential (primary) hypertension: Secondary | ICD-10-CM | POA: Insufficient documentation

## 2019-04-18 DIAGNOSIS — Z794 Long term (current) use of insulin: Secondary | ICD-10-CM | POA: Insufficient documentation

## 2019-04-18 DIAGNOSIS — R0789 Other chest pain: Secondary | ICD-10-CM | POA: Diagnosis not present

## 2019-04-18 DIAGNOSIS — F4323 Adjustment disorder with mixed anxiety and depressed mood: Secondary | ICD-10-CM | POA: Diagnosis not present

## 2019-04-18 DIAGNOSIS — Z79899 Other long term (current) drug therapy: Secondary | ICD-10-CM | POA: Insufficient documentation

## 2019-04-18 DIAGNOSIS — E119 Type 2 diabetes mellitus without complications: Secondary | ICD-10-CM | POA: Insufficient documentation

## 2019-04-18 DIAGNOSIS — R079 Chest pain, unspecified: Secondary | ICD-10-CM | POA: Diagnosis not present

## 2019-04-18 LAB — BASIC METABOLIC PANEL
Anion gap: 10 (ref 5–15)
BUN: 15 mg/dL (ref 6–20)
CO2: 21 mmol/L — ABNORMAL LOW (ref 22–32)
Calcium: 9.1 mg/dL (ref 8.9–10.3)
Chloride: 107 mmol/L (ref 98–111)
Creatinine, Ser: 0.76 mg/dL (ref 0.44–1.00)
GFR calc Af Amer: >60 mL/min (ref >60)
GFR calc non Af Amer: >60 mL/min (ref >60)
Glucose, Bld: 114 mg/dL — ABNORMAL HIGH (ref 70–99)
Potassium: 3.7 mmol/L (ref 3.5–5.1)
Sodium: 138 mmol/L (ref 135–145)

## 2019-04-18 LAB — CBC
HCT: 39.2 % (ref 36.0–46.0)
Hemoglobin: 12 g/dL (ref 12.0–15.0)
MCH: 24.7 pg — ABNORMAL LOW (ref 26.0–34.0)
MCHC: 30.6 g/dL (ref 30.0–36.0)
MCV: 80.7 fL (ref 80.0–100.0)
Platelets: 278 10*3/uL (ref 150–400)
RBC: 4.86 MIL/uL (ref 3.87–5.11)
RDW: 14.7 % (ref 11.5–15.5)
WBC: 5.9 10*3/uL (ref 4.0–10.5)
nRBC: 0 % (ref 0.0–0.2)

## 2019-04-18 LAB — I-STAT BETA HCG BLOOD, ED (NOT ORDERABLE): I-stat hCG, quantitative: <5 m[IU]/mL (ref <5)

## 2019-04-18 LAB — TROPONIN I (HIGH SENSITIVITY): Troponin I (High Sensitivity): <2 ng/L (ref <18)

## 2019-04-18 MED ORDER — LISINOPRIL 2.5 MG PO TABS
2.5000 mg | ORAL_TABLET | Freq: Once | ORAL | Status: AC
  Administered 2019-04-18: 2.5 mg via ORAL
  Filled 2019-04-18: qty 1

## 2019-04-18 NOTE — ED Notes (Signed)
Patient ambulated to X-ray 

## 2019-04-18 NOTE — Discharge Instructions (Signed)
Your work-up in the emergency department today is reassuring.  Take your blood pressure medication as prescribed.  Try to limit your intake of salt and processed foods as this can also elevate your blood pressure.  We believe many of your symptoms are secondary to untreated and worsening anxiety.  Have discussion with your doctor about possibly changing your blood pressure medication from lisinopril to a beta-blocker such as metoprolol or propranolol as this may help both your blood pressure and your anxiety.  You would also benefit from seeing a counselor or therapist for management of your worsening anxiety.  Try to avoid caffeinated beverages as well as energy drinks.  Get plenty of sleep at nighttime as poor sleep habits can worsen anxiety as well.  We recommend return to the emergency department for any new or concerning symptoms.

## 2019-04-18 NOTE — ED Triage Notes (Signed)
Pt reports chest pain that radiates up to her jaw and down her L arm. Reports increased stressors and anxiety. She has a hx of diabetes and hypertension.

## 2019-04-18 NOTE — ED Provider Notes (Signed)
[facility] DEPT Provider Note   CSN: [id number] Arrival date & time: 04/18/19  2058     History   Chief Complaint Chief Complaint  Patient presents with  . Chest Pain    HPI Joyce Green is a 33 y.o. female.     33 year old female presents to the emergency department for evaluation of chest pain.  She states that chest pain is intermittent and will sometimes radiate towards her jaw and down her left arm.  She reports some paresthesias in her left arm at times.  Her pain will typically resolve spontaneously.  It is nonexertional and not associated with diaphoresis, syncope or near syncope, shortness of breath.  Further denies fevers, leg swelling, hemoptysis, vomiting.  She does have a history of diabetes and hypertension.  Reports compliance with her medications, but did not take her Lisinopril this AM.  As an aside, patient states that she has had a difficult time coping with the loss of her daughter who was born stillborn.  She has not talked about these feelings very often; instead has chosen to work 6 days a week to keep her mind occupied.  Is also taking care of her father and his chronic illness.  Feels that her chest pain is often provoked when she is stressed.  She endorses hx of underlying anxiety, but has never required therapy or medication for this in the past.  Patient feels that her anxiety is ever present in her day-to-day life now.  The history is provided by the patient. No language interpreter was used.  Chest Pain   Past Medical History:  Diagnosis Date  . Diabetes mellitus without complication ([place])    T2  . Hypertension   . Palpitations     Patient Active Problem List   Diagnosis Date Noted  . Pyuria 03/30/2018  . DM (diabetes mellitus), type 2 ([place]) 09/21/2016  . Benign essential HTN 09/21/2016  . Obesity (BMI 30-39.9) 01/08/2013    Past Surgical History:  Procedure Laterality Date  . CESAREAN SECTION N/A 02/20/2014    Procedure: CESAREAN SECTION;  Surgeon: Florian Buff, MD;  Location: [facility];  Service: Obstetrics;  Laterality: N/A;  . CESAREAN SECTION N/A 07/28/2016   Procedure: CESAREAN SECTION;  Surgeon: Mora Bellman, MD;  Location: [facility];  Service: Obstetrics;  Laterality: N/A;     OB History    Gravida  3   Para  3   Term  2   Preterm  1   AB  0   Living  2     SAB  0   TAB  0   Ectopic  0   Multiple  0   Live Births  2            Home Medications    Prior to Admission medications   Medication Sig Start Date End Date Taking? Authorizing Provider  ACCU-CHEK FASTCLIX LANCETS MISC 1 Device by Percutaneous route 4 (four) times daily. 05/20/18   Donnamae Jude, MD  Blood Glucose Monitoring Suppl (ACCU-CHEK NANO SMARTVIEW) w/Device KIT 1 kit by Subdermal route as directed. Check blood sugars for fasting, and two hours after breakfast, lunch and dinner (4 checks daily) 01/22/18   Caren Macadam, MD  enalapril (VASOTEC) 10 MG tablet Take 1 tablet (10 mg total) by mouth daily. Patient not taking: Reported on 11/11/2018 03/24/18   Sloan Leiter, MD  glucose blood (ACCU-CHEK SMARTVIEW) test strip Use as instructed to check blood sugars  01/22/18   Caren Macadam, MD  ibuprofen (ADVIL,MOTRIN) 600 MG tablet Take 1 tablet (600 mg total) by mouth every 6 (six) hours as needed. Patient not taking: Reported on 11/11/2018 03/23/18   Sloan Leiter, MD  insulin glargine (LANTUS) 100 UNIT/ML injection Inject 0.08 mLs (8 Units total) into the skin at bedtime. Patient not taking: Reported on 11/11/2018 03/23/18   Sloan Leiter, MD  LANTUS SOLOSTAR 100 UNIT/ML Solostar Pen Inject 8 Units into the skin at bedtime.  09/20/18   [provider]  lisinopril (ZESTRIL) 2.5 MG tablet Take 2.5 mg by mouth daily. 09/20/18   [provider]  magnesium 30 MG tablet Take 30 mg by mouth daily.    [provider]  metFORMIN (GLUCOPHAGE) 1000 MG tablet Take 1  tablet (1,000 mg total) by mouth 2 (two) times daily with a meal. 03/23/18   Sloan Leiter, MD  pravastatin (PRAVACHOL) 10 MG tablet Take 10 mg by mouth daily.  09/20/18   [provider]  triamterene-hydrochlorothiazide (MAXZIDE-25) 37.5-25 MG tablet Take 1 tablet by mouth daily. Patient not taking: Reported on 11/11/2018 04/23/18   Donnamae Jude, MD  Vitamin D, Ergocalciferol, (DRISDOL) 1.25 MG (50000 UT) CAPS capsule Take 50,000 Units by mouth once a week. 08/20/18   [provider]    Family History Family History  Problem Relation Age of Onset  . Diabetes Mellitus I Mother   . Hypertension Mother   . Diabetes Mother   . Hypertension Father   . Cancer Maternal Grandmother   . Cancer Maternal Grandfather   . Cancer Paternal Grandmother   . Cancer Paternal Grandfather   . Early death Neg Hx   . Heart disease Neg Hx   . Hyperlipidemia Neg Hx   . Kidney disease Neg Hx   . Stroke Neg Hx     Social History Social History   Tobacco Use  . Smoking status: Former Smoker    Packs/day: 0.00    Years: 0.00    Pack years: 0.00    Quit date: 06/11/2009    Years since quitting: 9.8  . Smokeless tobacco: Never Used  . Tobacco comment: not while pregnant  Substance Use Topics  . Alcohol use: Not Currently    Alcohol/week: 4.0 standard drinks    Types: 2 Glasses of wine, 2 Cans of beer per week    Comment: occasional; stopped after found out she was pregnant  . Drug use: No    Comment: per prenatal record, +THC on initial visit u/a; no current use     Allergies   Patient has no known allergies.   Review of Systems Review of Systems  Cardiovascular: Positive for chest pain.  Ten systems reviewed and are negative for acute change, except as noted in the HPI.    Physical Exam Updated Vital Signs BP (!) 145/103   Pulse 84   Temp 97.7 F (36.5 C) (Oral)   Resp 12   Ht _0  (1.778 m)   Wt 104.3 kg   SpO2 94%   BMI 33.00 kg/m   Physical Exam Vitals  signs and nursing note reviewed.  Constitutional:      General: She is not in acute distress.    Appearance: She is well-developed. She is not diaphoretic.     Comments: Nontoxic appearing and in NAD  HENT:     Head: Normocephalic and atraumatic.  Eyes:     General: No scleral icterus.    Conjunctiva/sclera:  Conjunctivae normal.  Neck:     Musculoskeletal: Normal range of motion.  Cardiovascular:     Rate and Rhythm: Normal rate and regular rhythm.     Pulses: Normal pulses.  Pulmonary:     Effort: Pulmonary effort is normal. No respiratory distress.     Comments: Respirations even and unlabored Musculoskeletal: Normal range of motion.  Skin:    General: Skin is warm and dry.     Coloration: Skin is not pale.     Findings: No erythema or rash.  Neurological:     Mental Status: She is alert and oriented to person, place, and time.  Psychiatric:        Mood and Affect: Mood is anxious and depressed. Affect is tearful.        Speech: Speech normal.        Behavior: Behavior is cooperative.      ED Treatments / Results  Labs (all labs ordered are listed, but only abnormal results are displayed) Labs Reviewed  BASIC METABOLIC PANEL - Abnormal; Notable for the following components:      Result Value   CO2 21 (*)    Glucose, Bld 114 (*)    All other components within normal limits  CBC - Abnormal; Notable for the following components:   MCH 24.7 (*)    All other components within normal limits  I-STAT BETA HCG BLOOD, ED (NOT ORDERABLE)  TROPONIN I (HIGH SENSITIVITY)    EKG EKG Interpretation  Date/Time:  Saturday April 18 2019 21:19:19 EST Ventricular Rate:  96 PR Interval:    QRS Duration: 90 QT Interval:  358 QTC Calculation: 453 R Axis:   18 Text Interpretation: Sinus rhythm Consider left atrial enlargement Nonspecific ST abnormality , no other significant changes from previous Confirmed by Gareth Morgan [phone]) on 04/18/2019 11:45:56 PM   Radiology Dg  Chest 2 View  Result Date: 04/18/2019 CLINICAL DATA:  Chest pain EXAM: CHEST - 2 VIEW COMPARISON:  05/29/2018 FINDINGS: The heart size and mediastinal contours are within normal limits. Both lungs are clear. The visualized skeletal structures are unremarkable. IMPRESSION: No acute abnormality of the lungs. Electronically Signed   By: Eddie Candle M.D.   On: 04/18/2019 21:59    Procedures Procedures (including critical care time)  Medications Ordered in ED Medications  lisinopril (ZESTRIL) tablet 2.5 mg (2.5 mg Oral Given 04/18/19 2341)     Initial Impression / Assessment and Plan / ED Course  I have reviewed the triage vital signs and the nursing notes.  Pertinent labs & imaging results that were available during my care of the patient were reviewed by me and considered in my medical decision making (see chart for details).        33 year old female presents to the emergency department for evaluation of chest pain.  Noted to be hypertensive and tachycardic on arrival, but history very suggestive of increased anxiety.  The more the patient is able to talk about her anxiety and stress, the further her heart rate improves.  Her hypertension has also significantly lessened since arrival.  Cardiac work-up in the emergency department today is reassuring.  Her heart score is 2, attributable only to her risk factors.  Patient symptoms are more atypical for ACS and felt to directly reflect her anxiety state.  She has been provided with a resource guide for outpatient counseling services.  Encouraged follow-up with her primary care doctor within the week for recheck of her blood pressure.  Do not feel  further emergent work-up is indicated at this time.  Return precautions discussed and provided. Patient discharged in stable condition with no unaddressed concerns.   Final Clinical Impressions(s) / ED Diagnoses   Final diagnoses:  Adjustment disorder with mixed anxiety and depressed mood   Hypertension not at goal    ED Discharge Orders    None       Antonietta Breach, PA-C 05/03/19 0500    Gareth Morgan, MD 05/05/19 1302

## 2019-05-01 DIAGNOSIS — E559 Vitamin D deficiency, unspecified: Secondary | ICD-10-CM | POA: Diagnosis not present

## 2019-05-01 DIAGNOSIS — R5383 Other fatigue: Secondary | ICD-10-CM | POA: Diagnosis not present

## 2019-05-01 DIAGNOSIS — Z114 Encounter for screening for human immunodeficiency virus [HIV]: Secondary | ICD-10-CM | POA: Diagnosis not present

## 2019-05-01 DIAGNOSIS — Z0000 Encounter for general adult medical examination without abnormal findings: Secondary | ICD-10-CM | POA: Diagnosis not present

## 2019-05-01 DIAGNOSIS — E119 Type 2 diabetes mellitus without complications: Secondary | ICD-10-CM | POA: Diagnosis not present

## 2019-05-01 DIAGNOSIS — Z03818 Encounter for observation for suspected exposure to other biological agents ruled out: Secondary | ICD-10-CM | POA: Diagnosis not present

## 2019-05-01 DIAGNOSIS — Z1159 Encounter for screening for other viral diseases: Secondary | ICD-10-CM | POA: Diagnosis not present

## 2019-05-18 DIAGNOSIS — E7800 Pure hypercholesterolemia, unspecified: Secondary | ICD-10-CM | POA: Diagnosis not present

## 2019-05-18 DIAGNOSIS — I10 Essential (primary) hypertension: Secondary | ICD-10-CM | POA: Diagnosis not present

## 2019-05-18 DIAGNOSIS — E119 Type 2 diabetes mellitus without complications: Secondary | ICD-10-CM | POA: Diagnosis not present

## 2019-05-18 DIAGNOSIS — F4322 Adjustment disorder with anxiety: Secondary | ICD-10-CM | POA: Diagnosis not present

## 2019-07-01 ENCOUNTER — Telehealth: Payer: Self-pay | Admitting: Family Medicine

## 2019-07-01 NOTE — Telephone Encounter (Signed)
Received a call from Ms. Bielicki to start prenatal care. When asked if she lived in [place], she said yes. I informed her we had a office in [place], she said she is well aware of the office, and she has always come [facility] previous pregnancies. So I choose to come to this location.

## 2019-07-14 ENCOUNTER — Encounter: Payer: Self-pay | Admitting: Family Medicine

## 2019-07-14 ENCOUNTER — Ambulatory Visit (INDEPENDENT_AMBULATORY_CARE_PROVIDER_SITE_OTHER): Payer: Medicaid Other | Admitting: General Practice

## 2019-07-14 ENCOUNTER — Other Ambulatory Visit: Payer: Self-pay

## 2019-07-14 VITALS — BP 138/94 | HR 90

## 2019-07-14 DIAGNOSIS — Z3201 Encounter for pregnancy test, result positive: Secondary | ICD-10-CM | POA: Diagnosis present

## 2019-07-14 LAB — POCT PREGNANCY, URINE: Preg Test, Ur: POSITIVE — AB

## 2019-07-14 NOTE — Progress Notes (Signed)
Chart reviewed - agree with CMA/RN documentation.  ° °

## 2019-07-14 NOTE — Progress Notes (Signed)
Patient presents to office today for UPT. UPT +. Patient reports first positive home test around 1/15. LMP 12/16- she reports a few hours of spotting on 1/10 but nothing since then. EDD 03/02/20 [redacted]w[redacted]d today. Patient has hx of CHTN and type 2 diabetes. She states Lantus 16 units at bedtime, Metformin 1000mg  BID, and PNV. She was taking pravastatin and lisinopril but stopped when she found out she was pregnant. BP 138/94. Patient reports she doesn't check her blood sugars like she is supposed to and was supposed to be getting the subcutaneous glucose monitoring system but never heard anything from the pharmacy. She states she has had an A1C recently which was somewhere between 7-8%. Patient reports history of IUFD in 2019 but isn't sure why it happened. Called & discussed with Dr 2020 who advises patient to stay off BP medications, increase Lantus to 25 units at bedtime, needs diabetes education appt soon to help facilitate receiving monitor, and start OB care within next 2-3 weeks.  Discussed with patient. Patient verbalized understanding to plan of care.   Adrian Blackwater RN BSN 07/14/19

## 2019-07-16 ENCOUNTER — Other Ambulatory Visit: Payer: Self-pay

## 2019-07-16 ENCOUNTER — Encounter: Payer: Medicaid Other | Attending: Obstetrics and Gynecology | Admitting: Registered"

## 2019-07-16 ENCOUNTER — Ambulatory Visit: Payer: Medicaid Other | Admitting: Registered"

## 2019-07-16 DIAGNOSIS — Z3A00 Weeks of gestation of pregnancy not specified: Secondary | ICD-10-CM | POA: Diagnosis not present

## 2019-07-16 DIAGNOSIS — E119 Type 2 diabetes mellitus without complications: Secondary | ICD-10-CM | POA: Diagnosis not present

## 2019-07-16 DIAGNOSIS — O0990 Supervision of high risk pregnancy, unspecified, unspecified trimester: Secondary | ICD-10-CM | POA: Diagnosis not present

## 2019-07-16 DIAGNOSIS — O24319 Unspecified pre-existing diabetes mellitus in pregnancy, unspecified trimester: Secondary | ICD-10-CM | POA: Insufficient documentation

## 2019-07-16 DIAGNOSIS — O24111 Pre-existing diabetes mellitus, type 2, in pregnancy, first trimester: Secondary | ICD-10-CM

## 2019-07-16 DIAGNOSIS — Z713 Dietary counseling and surveillance: Secondary | ICD-10-CM | POA: Insufficient documentation

## 2019-07-16 NOTE — Progress Notes (Signed)
Patient was seen on 07/16/19 for T2DM in pregnancy self-management education. RD obtained A1c 7.3% from [facility] lab of May 01, 2019 provided over the phone. Pt reports still birth at 30 weeks with previous pregnancy and states she was told her A1c was 11% at that time. Patient also c/o being tired and gets out of breath easily.  EDD 03/02/20. Patient states prior history of GDM and education in 2015. Diet history obtained. Patient eats a variety of all food groups. Beverages include diet soda, "not enough" water, drop-in flavor, coffee with creamer (~6g cho). Pt states she has 2 children and is in school pt-time for nursing.  Medications: Lantus was increased to 25 u, metformin kept at 1,000 mg bid. Patient states she is motivated to reduce medication.  The following learning objectives were met by the patient :   States the definition of Gestational Diabetes  States why dietary management is important in controlling blood glucose  Describes the effects of carbohydrates on blood glucose levels  Demonstrates ability to create a balanced meal plan  Demonstrates carbohydrate counting   States when to check blood glucose levels  Demonstrates proper blood glucose monitoring techniques  States the effect of stress and exercise on blood glucose levels  States the importance of limiting caffeine and abstaining from alcohol and smoking  Plan:  Aim for 3 Carb Choices per meal (45 grams) +/- 1 either way  Aim for 1-2 Carbs per snack Begin reading food labels for Total Carbohydrate of foods If OK with your MD, consider  increasing your activity level by walking, Arm Chair Exercises or other activity daily as tolerated Begin checking BG before breakfast and 2 hours after first bite of breakfast, lunch and dinner as directed by MD  Baby Scripts: Patient was introduced to [employer] and plans to use as record of BG electronically Take medication if directed by MD  Patient already has a  meter: Accu chek Guide Testing periodically, monitor shows: FBG 85, PPBG 155  Patient instructed to monitor glucose levels: FBS: 60 - 95 mg/dl 2 hour: <120 mg/dl  Patient received the following handouts:  Nutrition Diabetes and Pregnancy  Carbohydrate Counting List   Patient will be seen for follow-up in 2-3 weeks or as needed.

## 2019-07-20 ENCOUNTER — Other Ambulatory Visit: Payer: Self-pay

## 2019-08-12 ENCOUNTER — Other Ambulatory Visit: Payer: Self-pay

## 2019-08-12 ENCOUNTER — Ambulatory Visit (INDEPENDENT_AMBULATORY_CARE_PROVIDER_SITE_OTHER): Payer: Medicaid Other | Admitting: *Deleted

## 2019-08-12 DIAGNOSIS — Z8759 Personal history of other complications of pregnancy, childbirth and the puerperium: Secondary | ICD-10-CM

## 2019-08-12 DIAGNOSIS — E669 Obesity, unspecified: Secondary | ICD-10-CM

## 2019-08-12 DIAGNOSIS — O0990 Supervision of high risk pregnancy, unspecified, unspecified trimester: Secondary | ICD-10-CM | POA: Insufficient documentation

## 2019-08-12 DIAGNOSIS — O24319 Unspecified pre-existing diabetes mellitus in pregnancy, unspecified trimester: Secondary | ICD-10-CM

## 2019-08-12 HISTORY — DX: Personal history of other complications of pregnancy, childbirth and the puerperium: Z87.59

## 2019-08-12 MED ORDER — GLUCOSE BLOOD VI STRP: ORAL_STRIP | 12 refills | Status: DC

## 2019-08-12 MED ORDER — BLOOD PRESSURE KIT DEVI: 1.0000 | 0 refills | Status: DC | PRN

## 2019-08-12 NOTE — Patient Instructions (Signed)

## 2019-08-12 NOTE — Progress Notes (Signed)
I connected with  Joyce Green on 08/12/19 at  3:30 PM EST by telephone and verified that I am speaking with the correct person using two identifiers.   I discussed the limitations, risks, security and privacy concerns of performing an evaluation and management service by telephone and the availability of in person appointments. I also discussed with the patient that there may be a patient responsible charge related to this service. The patient expressed understanding and agreed to proceed.  I explained I am completing her New OB Intake today. We discussed Her EDD and that it is based on  sure LMP . I reviewed her allergies, meds, OB History, Medical /Surgical history, and appropriate screenings. I informed her of [facility] services. I offered her an appointment due to her history( IUFD) but she declined at this time. I explained she can ask for an appointment if needed at a later time. We discussed she had sent message about getting her bp checked but has not scheduled an appointment. I offered a nurse visit for bp check for 08/13/19 but she declined due to class. I offered appointment 08/17/19 Monday which she accepted. She also states she needed rx for strips for her meter. RX sent.   I explained I will send her the [employer] app . She states the dietician sent it but she did not get the email. I attempted to resend it. She states she did not get it.I explained I will contact [employer] and ask them to send it. I asked her to let us know if she does not get it.   I explained we will send a blood pressure cuff to [facility] that will fill that prescription and we called [facility] to verify they received her prescription and confirmed they will deliver to the office at her request. Then at her  first ob appointment we will show her how to use it. Explained  then we will have her take her blood pressure weekly and enter into the app. I explained she will have some visits in office and some virtually. She  already has Sports coach. I reviewed her new ob  appointment date/ time with her , our location and to wear mask, no visitors.  I explained she will have a pelvic exam, ob bloodwork, hemoglobin a1C, cbg ,pap, and  genetic testing if desired,- she does want a panorama. I scheduled an [place] at 19 weeks and gave her the appointment. She voices understanding.   Jullia Mulligan,RN 08/12/2019  3:37 PM

## 2019-08-13 NOTE — Progress Notes (Signed)
Patient seen and assessed by nursing staff during this encounter. I have reviewed the chart and agree with the documentation and plan.  Dianca Owensby, MD 08/13/2019 2:24 PM   

## 2019-08-17 ENCOUNTER — Ambulatory Visit (INDEPENDENT_AMBULATORY_CARE_PROVIDER_SITE_OTHER): Payer: Medicaid Other

## 2019-08-17 ENCOUNTER — Other Ambulatory Visit: Payer: Self-pay

## 2019-08-17 VITALS — BP 142/84 | HR 101

## 2019-08-17 DIAGNOSIS — O0990 Supervision of high risk pregnancy, unspecified, unspecified trimester: Secondary | ICD-10-CM

## 2019-08-17 NOTE — Progress Notes (Signed)
Pt here today for BP check.  Pt reports that she has a HTN in which she was taking Lisinopril for that she was recently asked to stop because she was pregnant.  Pt reports that she headaches intermittently sometimes they are behind her left eye.  Pt reports that has taken Tylenol and is not effective.  I verified with pt if she has received her BP cuff.  Pt reports that she has not received it.  I gave patient [facility] card and advised pt to please go pick up her BP cuff so if she has sx's she is able to check her BP.  Notified Dr. Debroah Loop who recommends that pt continue to monitor for sx's of HTN and that we will f/u with her at her NEW OB appt scheduled on 08/26/19.  Notified pt provider's recommendation.  Pt verbalized understanding with no further questions.   Addison Naegeli, RN 08/17/19

## 2019-08-18 NOTE — Progress Notes (Signed)
Patient ID: Joyce Green, female   DOB: 09/22/1985, 34 y.o.   MRN: [id number] Patient seen and assessed by nursing staff during this encounter. I have reviewed the chart and agree with the documentation and plan.  Scheryl Darter, MD 08/18/2019 8:29 AM

## 2019-08-26 ENCOUNTER — Encounter: Payer: Self-pay | Admitting: Obstetrics and Gynecology

## 2019-08-26 ENCOUNTER — Other Ambulatory Visit (HOSPITAL_COMMUNITY)
Admission: RE | Admit: 2019-08-26 | Discharge: 2019-08-26 | Disposition: A | Discharge status: Home / Self Care | Payer: Medicaid Other | Source: Ambulatory Visit | Attending: Obstetrics and Gynecology | Admitting: Obstetrics and Gynecology

## 2019-08-26 ENCOUNTER — Other Ambulatory Visit: Payer: Self-pay

## 2019-08-26 ENCOUNTER — Ambulatory Visit (INDEPENDENT_AMBULATORY_CARE_PROVIDER_SITE_OTHER): Payer: Medicaid Other | Admitting: Obstetrics and Gynecology

## 2019-08-26 VITALS — BP 137/94 | HR 99 | Wt 238.5 lb

## 2019-08-26 DIAGNOSIS — O0990 Supervision of high risk pregnancy, unspecified, unspecified trimester: Secondary | ICD-10-CM

## 2019-08-26 DIAGNOSIS — O34219 Maternal care for unspecified type scar from previous cesarean delivery: Secondary | ICD-10-CM

## 2019-08-26 DIAGNOSIS — O24311 Unspecified pre-existing diabetes mellitus in pregnancy, first trimester: Secondary | ICD-10-CM | POA: Diagnosis not present

## 2019-08-26 DIAGNOSIS — Z98891 History of uterine scar from previous surgery: Secondary | ICD-10-CM

## 2019-08-26 DIAGNOSIS — O09291 Supervision of pregnancy with other poor reproductive or obstetric history, first trimester: Secondary | ICD-10-CM

## 2019-08-26 DIAGNOSIS — O99210 Obesity complicating pregnancy, unspecified trimester: Secondary | ICD-10-CM | POA: Diagnosis not present

## 2019-08-26 DIAGNOSIS — Z8759 Personal history of other complications of pregnancy, childbirth and the puerperium: Secondary | ICD-10-CM

## 2019-08-26 DIAGNOSIS — O10911 Unspecified pre-existing hypertension complicating pregnancy, first trimester: Secondary | ICD-10-CM | POA: Diagnosis not present

## 2019-08-26 DIAGNOSIS — R9439 Abnormal result of other cardiovascular function study: Secondary | ICD-10-CM | POA: Insufficient documentation

## 2019-08-26 DIAGNOSIS — O24319 Unspecified pre-existing diabetes mellitus in pregnancy, unspecified trimester: Secondary | ICD-10-CM

## 2019-08-26 DIAGNOSIS — O09891 Supervision of other high risk pregnancies, first trimester: Secondary | ICD-10-CM

## 2019-08-26 DIAGNOSIS — O10919 Unspecified pre-existing hypertension complicating pregnancy, unspecified trimester: Secondary | ICD-10-CM

## 2019-08-26 DIAGNOSIS — E669 Obesity, unspecified: Secondary | ICD-10-CM

## 2019-08-26 HISTORY — DX: History of uterine scar from previous surgery: Z98.891

## 2019-08-26 MED ORDER — ASPIRIN EC 81 MG PO TBEC: 81.0000 mg | DELAYED_RELEASE_TABLET | Freq: Every day | ORAL | 3 refills | Status: DC

## 2019-08-26 NOTE — Progress Notes (Signed)
New OB Note  08/26/2019   Clinic: [facility]  Chief Complaint: NOB  Primary Care Provider: [facility]  Transfer of Care Patient: no  History of Present Illness: Ms. Salatino is a 34 y.o. [id number] @ 13/0 weeks [facility] 9/22 [tentative], based on Patient's last menstrual period was 05/27/2019.).  Preg complicated by has Obesity (BMI 30-39.9); Obesity in pregnancy; DM (diabetes mellitus), type 2 ([place]); Benign essential HTN; Chronic hypertension affecting pregnancy; Supervision of high risk pregnancy, antepartum; Preexisting diabetes complicating pregnancy, antepartum; History of IUFD; History of VBAC; Abnormal cardiovascular stress test; and Medication exposure during first trimester of pregnancy on their problem list.   Any events prior to today's visit: no Her periods were: qmonth, regular She was using no method when she conceived. IUD fell out about a month after it was placed at her PP visit in late 2019 She has Negative signs or symptoms of nausea/vomiting of pregnancy. She has Negative signs or symptoms of miscarriage or preterm labor On any medications around the time she conceived/early pregnancy: Pravastatin, lisinopril, ibuprofen, vitamin d, magnesium  ROS: A 12-point review of systems was performed and negative, except as stated in the above HPI.  OBGYN History: As per HPI. OB History  Gravida Para Term Preterm AB Living  4 3 2 1  0 2  SAB TAB Ectopic Multiple Live Births  0 0 0 0 2    # Outcome Date GA Lbr Len/2nd Weight Sex Delivery Anes PTL Lv  4 Current           3 Preterm 03/22/18 [redacted]w[redacted]d 18:49 / 01:09 5 lb 10 oz (2.551 kg) F Vag-Spont EPI  FD     Birth Comments: small nose, skin peeling, Had CHTN, DM, at routine testing found no heartbeat and induced labor  2 Term 07/28/16 [redacted]w[redacted]d  10 lb 2.3 oz (4.6 kg) M CS-Vac Spinal  LIV     Birth Comments: HTN, repeat c/ s due to macrosomia  1 Term 02/20/14 [redacted]w[redacted]d 08:40 / 07:36 6 lb 1 oz (2.75 kg) F  CS-LTranv EPI  LIV     Birth Comments: C/S due FTP, NRFHR, already had DM, had HTN end of pregnancy     Complications: Fetal Intolerance    Any issues with any prior pregnancies: yes, see A/P History of pap smears: Yes. Last pap smear 2018 and results were NILM/HPV negative   Past Medical History: Past Medical History:  Diagnosis Date  . Bacteremia 03/2018   approximately 10 days after vbac  . Diabetes mellitus without complication ([place])    T2  . Hypertension   . Palpitations     Past Surgical History: Past Surgical History:  Procedure Laterality Date  . CESAREAN SECTION N/A 02/20/2014   Procedure: CESAREAN SECTION;  Surgeon: Florian Buff, MD;  Location: [facility];  Service: Obstetrics;  Laterality: N/A;  . CESAREAN SECTION N/A 07/28/2016   Procedure: CESAREAN SECTION;  Surgeon: Mora Bellman, MD;  Location: [facility];  Service: Obstetrics;  Laterality: N/A;    Family History:  Family History  Problem Relation Age of Onset  . Diabetes Mellitus I Mother   . Hypertension Mother   . Diabetes Mother   . Hypertension Father   . Lung cancer Father   . Cancer Maternal Grandmother   . Cancer Maternal Grandfather   . Cancer Paternal Grandmother   . Cancer Paternal Grandfather   . Early death Neg Hx   . Heart disease Neg Hx   . Hyperlipidemia  Neg Hx   . Kidney disease Neg Hx   . Stroke Neg Hx     Social History:  Social History   Socioeconomic History  . Marital status: Married    Spouse name: Not on file  . Number of children: Not on file  . Years of education: Not on file  . Highest education level: Not on file  Occupational History  . Not on file  Tobacco Use  . Smoking status: Former Smoker    Packs/day: 0.00    Years: 0.00    Pack years: 0.00    Types: Cigars    Quit date: 06/11/2009    Years since quitting: 10.2  . Smokeless tobacco: Never Used  . Tobacco comment: not while pregnant  Substance and Sexual Activity  . Alcohol use: Not Currently     Alcohol/week: 4.0 standard drinks    Types: 2 Glasses of wine, 2 Cans of beer per week    Comment: occasional; stopped after found out she was pregnant  . Drug use: Not Currently    Types: Marijuana    Comment: per prenatal record, +THC on initial visit u/a; no current use  . Sexual activity: Yes    Partners: Male    Birth control/protection: None  Other Topics Concern  . Not on file  Social History Narrative  . Not on file   Social Determinants of Health   Financial Resource Strain:   . Difficulty of Paying Living Expenses:   Food Insecurity: No Food Insecurity  . Worried About Programme researcher, broadcasting/film/video in the Last Year: Never true  . Ran Out of Food in the Last Year: Never true  Transportation Needs: No Transportation Needs  . Lack of Transportation (Medical): No  . Lack of Transportation (Non-Medical): No  Physical Activity:   . Days of Exercise per Week:   . Minutes of Exercise per Session:   Stress:   . Feeling of Stress :   Social Connections:   . Frequency of Communication with Friends and Family:   . Frequency of Social Gatherings with Friends and Family:   . Attends Religious Services:   . Active Member of Clubs or Organizations:   . Attends Banker Meetings:   Marland Kitchen Marital Status:   Intimate Partner Violence:   . Fear of Current or Ex-Partner:   . Emotionally Abused:   Marland Kitchen Physically Abused:   . Sexually Abused:     Allergy: No Known Allergies  Current Outpatient Medications: PN vitamin, lantus 25 qhs, meformin 1000/1000  Physical Exam:   BP (!) 137/94   Pulse 99   Wt 238 lb 8 oz (108.2 kg)   LMP 05/27/2019   BMI 34.22 kg/m  Body mass index is 34.22 kg/m. Contractions: Not present Vag. Bleeding: None. Fundal height: not applicable FHTs: 158  General appearance: Well nourished, well developed female in no acute distress.  Neck:  Supple, normal appearance, and no thyromegaly  Cardiovascular: S1, S2 normal, no murmur, rub or gallop, regular  rate and rhythm Respiratory:  Clear to auscultation bilateral. Normal respiratory effort Abdomen: positive bowel sounds and no masses, hernias; diffusely non tender to palpation, non distended Breasts: patient denies any breast s/s Neuro/Psych:  Normal mood and affect.  Skin:  Warm and dry.  Lymphatic:  No inguinal lymphadenopathy.   Pelvic exam: is not limited by body habitus EGBUS: within normal limits, Vagina: within normal limits and with no blood in the vault, Cervix: normal appearing cervix without discharge or  lesions, closed/long/high, Uterus:  enlarged, c/w 12-14 week size, and Adnexa:  normal adnexa and no mass, fullness, tenderness  Laboratory: None  Imaging:  None. Pt states she has not had an u/s this pregnancy  Assessment: pt stable  Plan: 1. Supervision of high risk pregnancy, antepartum Routine care Set up for MFM consult MFM anatomy u/s scheduled Recommend afp after 15wks Start low dose ASA now Will get limited thrombophilia work up: anti-phospholipid antibody screen, anti cardiolipin igg/igm, anti beta 2 glycoprotein igg/igm and lupus anti-coagulant - Genetic Screening - Obstetric Panel, Including HIV - Culture, OB Urine - Cytology - PAP( [place]) - Hemoglobin A1c - TSH - Protein / creatinine ratio, urine - Comprehensive metabolic panel - Antiphospholipid Syndrome Comp - AMB referral to maternal fetal medicine - VITAMIN D 25 Hydroxy (Vit-D Deficiency, Fractures) - Referral to Nutrition and Diabetes Services - Ambulatory referral to Cardiology  2. Medication exposure during first trimester of pregnancy  3. Preexisting diabetes complicating pregnancy, antepartum Continue on lantus for now. May transitiion to nph. Need more consistent checks. Pt to check more CBGs. Parameters given - Referral to Nutrition and Diabetes Services  4. Abnormal cardiovascular stress test See below  5. Chronic hypertension affecting pregnancy No need for meds for right  now - Ambulatory referral to Cardiology  6. History of VBAC D/w her more later in pregnancy  7. History of IUFD  8. Obesity (BMI 30-39.9)  9. Obesity in pregnancy  Problem list reviewed and updated.  Follow up in 2 weeks.  >50% of 45 min visit spent on counseling and coordination of care.     Cornelia Copa MD Attending [facility])

## 2019-08-26 NOTE — Patient Instructions (Signed)
Check your blood sugars four times a day: Morning fasting: less than 95-100 2 hours after every meal: less than 120 1 hour after every meal: less than 140

## 2019-08-26 NOTE — Progress Notes (Signed)
Pt did not leave urine sample at [facility] visit. Called pt and requested pt come to the office today or tomorrow during office hours to leave a urine sample.  Fleet Contras RN 08/26/19

## 2019-08-27 ENCOUNTER — Encounter: Payer: Self-pay | Admitting: *Deleted

## 2019-08-27 LAB — CERVICOVAGINAL ANCILLARY ONLY
Bacterial Vaginitis (gardnerella): NEGATIVE
Candida Glabrata: NEGATIVE
Candida Vaginitis: NEGATIVE
Chlamydia: NEGATIVE
Comment: NEGATIVE
Comment: NEGATIVE
Comment: NEGATIVE
Comment: NEGATIVE
Comment: NEGATIVE
Comment: NORMAL
Neisseria Gonorrhea: NEGATIVE
Trichomonas: NEGATIVE

## 2019-08-31 ENCOUNTER — Ambulatory Visit (HOSPITAL_COMMUNITY): Admission: RE | Admit: 2019-08-31 | Payer: Medicaid Other | Source: Ambulatory Visit

## 2019-08-31 ENCOUNTER — Ambulatory Visit (HOSPITAL_COMMUNITY): Payer: Medicaid Other

## 2019-08-31 ENCOUNTER — Other Ambulatory Visit: Payer: Self-pay

## 2019-08-31 ENCOUNTER — Ambulatory Visit (HOSPITAL_COMMUNITY): Payer: Medicaid Other | Attending: Obstetrics and Gynecology

## 2019-08-31 DIAGNOSIS — O24319 Unspecified pre-existing diabetes mellitus in pregnancy, unspecified trimester: Secondary | ICD-10-CM

## 2019-08-31 DIAGNOSIS — O10919 Unspecified pre-existing hypertension complicating pregnancy, unspecified trimester: Secondary | ICD-10-CM

## 2019-09-01 ENCOUNTER — Other Ambulatory Visit: Payer: Medicaid Other

## 2019-09-09 ENCOUNTER — Other Ambulatory Visit: Payer: Self-pay

## 2019-09-09 ENCOUNTER — Ambulatory Visit (INDEPENDENT_AMBULATORY_CARE_PROVIDER_SITE_OTHER): Payer: Medicaid Other | Admitting: Obstetrics & Gynecology

## 2019-09-09 VITALS — BP 143/89 | HR 110 | Wt 242.6 lb

## 2019-09-09 DIAGNOSIS — O24312 Unspecified pre-existing diabetes mellitus in pregnancy, second trimester: Secondary | ICD-10-CM

## 2019-09-09 DIAGNOSIS — Z8759 Personal history of other complications of pregnancy, childbirth and the puerperium: Secondary | ICD-10-CM

## 2019-09-09 DIAGNOSIS — Z3A15 15 weeks gestation of pregnancy: Secondary | ICD-10-CM

## 2019-09-09 DIAGNOSIS — O10912 Unspecified pre-existing hypertension complicating pregnancy, second trimester: Secondary | ICD-10-CM

## 2019-09-09 DIAGNOSIS — O10919 Unspecified pre-existing hypertension complicating pregnancy, unspecified trimester: Secondary | ICD-10-CM

## 2019-09-09 DIAGNOSIS — O0990 Supervision of high risk pregnancy, unspecified, unspecified trimester: Secondary | ICD-10-CM

## 2019-09-09 MED ORDER — NIFEDIPINE ER OSMOTIC RELEASE 30 MG PO TB24: 30.0000 mg | ORAL_TABLET | Freq: Every day | ORAL | 2 refills | Status: DC

## 2019-09-09 NOTE — Progress Notes (Signed)
Pt states having problems with BRx & recording her Glucose levels but has been tracking #s & knows them, has in monitor. Will send readings for now in My Chart.

## 2019-09-09 NOTE — Progress Notes (Signed)
   PRENATAL VISIT NOTE  Subjective:  Joyce Green is a 34 y.o. G4P2102 at [redacted]w[redacted]d being seen today for ongoing prenatal care.  She is currently monitored for the following issues for this high-risk pregnancy and has Obesity (BMI 30-39.9); Obesity in pregnancy; DM (diabetes mellitus), type 2 ([facility]); Benign essential HTN; Chronic hypertension affecting pregnancy; Supervision of high risk pregnancy, antepartum; Preexisting diabetes complicating pregnancy, antepartum; History of IUFD; History of VBAC; Abnormal cardiovascular stress test; and Medication exposure during first trimester of pregnancy on their problem list.  Patient reports no complaints.  Contractions: Not present. Vag. Bleeding: None.  Movement: Absent. Denies leaking of fluid.   The following portions of the patient's history were reviewed and updated as appropriate: allergies, current medications, past family history, past medical history, past social history, past surgical history and problem list.   Objective:   Vitals:   09/09/19 1435 09/09/19 1437  BP: (!) 148/102 (!) 143/89  Pulse: (!) 115 (!) 110  Weight: 242 lb 9.6 oz (110 kg)     Fetal Status: Fetal Heart Rate (bpm): 154   Movement: Absent     General:  Alert, oriented and cooperative. Patient is in no acute distress.  Skin: Skin is warm and dry. No rash noted.   Cardiovascular: Normal heart rate noted  Respiratory: Normal respiratory effort, no problems with respiration noted  Abdomen: Soft, gravid, appropriate for gestational age.  Pain/Pressure: Absent     Pelvic: Cervical exam deferred        Extremities: Normal range of motion.  Edema: None  Mental Status: Normal mood and affect. Normal behavior. Normal judgment and thought content.   Assessment and Plan:  Pregnancy: [id number] at [redacted]w[redacted]d 1. Chronic hypertension affecting pregnancy Will start on medication for better BP control. Continue ASA. Continue close monitoring, home BP monitoring with [employer]. -  NIFEdipine (PROCARDIA-XL/NIFEDICAL-XL) 30 MG 24 hr tablet; Take 1 tablet (30 mg total) by mouth daily. Can increase to twice a day as needed for symptomatic contractions  Dispense: 30 tablet; Refill: 2  2. Preexisting diabetes complicating pregnancy in second trimester, antepartum [employer]s with logging issues, reviewed sugars on phone note and they were within range. Continue Metformin.   3. History of IUFD APLA panel pending. Will need antenatal testing in third trimester.  4. Supervision of high risk pregnancy, antepartum Anatomy scanscheduled. Panorama and AFP screen to be drawn today.  - AFP, Serum, Open Spina Bifida - Hepatitis C antibody No other complaints or concerns.  Routine obstetric precautions reviewed.  Please refer to After Visit Summary for other counseling recommendations.   Return in about 3 weeks (around 09/30/2019) for OFFICE [name] Visit.  Future Appointments  Date Time Provider Department Center  09/14/2019  1:40 PM Little Ishikawa, MD [facility]  10/07/2019  2:15 PM [facility] NURSE [facility]  10/07/2019  2:15 PM [facility] [place] 4 [facility]    Jaynie Collins, MD

## 2019-09-09 NOTE — Patient Instructions (Signed)

## 2019-09-10 LAB — HEPATITIS C ANTIBODY: Hep C Virus Ab: <0.1 s/co ratio (ref 0.0–0.9)

## 2019-09-10 LAB — PROTEIN / CREATININE RATIO, URINE
Creatinine, Urine: 128.6 mg/dL
Protein, Ur: 11.8 mg/dL
Protein/Creat Ratio: 92 mg/g creat (ref 0–200)

## 2019-09-11 LAB — AFP, SERUM, OPEN SPINA BIFIDA
AFP MoM: 1.33
AFP Value: 26.4 ng/mL
Gest. Age on Collection Date: 15 weeks
Maternal Age At EDD: 33.7 yr
OSBR Risk 1 IN: 2626
Test Results:: NEGATIVE
Weight: 242 [lb_av]

## 2019-09-11 LAB — CULTURE, OB URINE

## 2019-09-11 LAB — URINE CULTURE, OB REFLEX

## 2019-09-13 NOTE — Progress Notes (Signed)
Cardiology Office Note:    Date:  09/14/2019   ID:  Joyce Green, DOB [birth year], MRN [id number]  PCP:  Simona Huh, NP  Cardiologist:  Donato Heinz, MD  Electrophysiologist:  None   Referring MD: Aletha Halim, MD   Chief Complaint  Patient presents with  . Hypertension    History of Present Illness:    Joyce Green is a 34 y.o. female with a hx of hypertension, type 2 diabetes who is referred by Dr. Ilda Basset for evaluation of hypertension.  She is currently 15 months pregnant.  She previously had seen a cardiologist at [facility].  Had episode of chest pain which she was seen in the ED 04/2019.  Reports had TTE and stress test done last year. TTE 11/18/2012 showed normal LVEF, mild LVH.  Underwent nuclear stress test 11/12/2018 that showed partially reversible anterior wall defect.  CTPA on 05/29/2018 showed no PE but did note borderline prominent main pulmonary outflow tract concerning for underlying pulmonary hypertension.  She currently denies any recent chest pain.  Reports during first trimester having significant dyspnea.  However that has improved, currently no shortness of breath.  She was prescribed last week nifedipine by her obstetrician, but has not started taking it yet.  Reports having palpitations last year but none in the last 3 to 4 months.  Reports that she smokes marijuana 2-3 times per year.  No cigarettes.  No family history of heart disease in her immediate family    Past Medical History:  Diagnosis Date  . Bacteremia 03/2018   approximately 10 days after vbac  . Diabetes mellitus without complication ([place])    T2  . Hypertension   . Palpitations     Past Surgical History:  Procedure Laterality Date  . CESAREAN SECTION N/A 02/20/2014   Procedure: CESAREAN SECTION;  Surgeon: Florian Buff, MD;  Location: [place] [facility];  Service: Obstetrics;  Laterality: N/A;  . CESAREAN SECTION N/A 07/28/2016   Procedure: CESAREAN SECTION;  Surgeon:  Mora Bellman, MD;  Location: [place];  Service: Obstetrics;  Laterality: N/A;    Current Medications: Current Meds  Medication Sig  . ACCU-CHEK FASTCLIX LANCETS MISC 1 Device by Percutaneous route 4 (four) times daily.  Marland Kitchen aspirin EC 81 MG tablet Take 1 tablet (81 mg total) by mouth daily.  . Blood Glucose Monitoring Suppl (ACCU-CHEK NANO SMARTVIEW) w/Device KIT 1 kit by Subdermal route as directed. Check blood sugars for fasting, and two hours after breakfast, lunch and dinner (4 checks daily)  . Blood Pressure Monitoring (BLOOD PRESSURE KIT) DEVI 1 Device by Does not apply route as needed.  Marland Kitchen glucose blood (ACCU-CHEK SMARTVIEW) test strip Use as instructed to check blood sugars  . glucose blood test strip Use as instructed  . insulin glargine (LANTUS) 100 unit/mL SOPN Inject 25 Units into the skin daily.  . magnesium 30 MG tablet Take 30 mg by mouth daily.  Marland Kitchen NIFEdipine (PROCARDIA-XL/NIFEDICAL-XL) 30 MG 24 hr tablet Take 1 tablet (30 mg total) by mouth daily. Can increase to twice a day as needed for symptomatic contractions  . Prenatal Vit-Fe Fumarate-FA (PRENATAL PO) Take by mouth.     Allergies:   Patient has no known allergies.   Social History   Socioeconomic History  . Marital status: Married    Spouse name: Not on file  . Number of children: Not on file  . Years of education: Not on file  . Highest education level: Not on file  Occupational  History  . Not on file  Tobacco Use  . Smoking status: Former Smoker    Packs/day: 0.00    Years: 0.00    Pack years: 0.00    Types: Cigars    Quit date: 06/11/2009    Years since quitting: 10.2  . Smokeless tobacco: Never Used  . Tobacco comment: not while pregnant  Substance and Sexual Activity  . Alcohol use: Not Currently    Alcohol/week: 4.0 standard drinks    Types: 2 Glasses of wine, 2 Cans of beer per week    Comment: occasional; stopped after found out she was pregnant  . Drug use: Not Currently    Types:  Marijuana    Comment: per prenatal record, +THC on initial visit u/a; no current use  . Sexual activity: Yes    Partners: Male    Birth control/protection: None  Other Topics Concern  . Not on file  Social History Narrative  . Not on file   Social Determinants of Health   Financial Resource Strain:   . Difficulty of Paying Living Expenses:   Food Insecurity: No Food Insecurity  . Worried About Charity fundraiser in the Last Year: Never true  . Ran Out of Food in the Last Year: Never true  Transportation Needs: No Transportation Needs  . Lack of Transportation (Medical): No  . Lack of Transportation (Non-Medical): No  Physical Activity:   . Days of Exercise per Week:   . Minutes of Exercise per Session:   Stress:   . Feeling of Stress :   Social Connections:   . Frequency of Communication with Friends and Family:   . Frequency of Social Gatherings with Friends and Family:   . Attends Religious Services:   . Active Member of Clubs or Organizations:   . Attends Archivist Meetings:   Marland Kitchen Marital Status:      Family History: The patient's family history includes Cancer in her maternal grandfather, maternal grandmother, paternal grandfather, and paternal grandmother; Diabetes in her mother; Diabetes Mellitus I in her mother; Hypertension in her father and mother; Lung cancer in her father. There is no history of Early death, Heart disease, Hyperlipidemia, Kidney disease, or Stroke.  ROS:   Please see the history of present illness.    All other systems reviewed and are negative.  EKGs/Labs/Other Studies Reviewed:    The following studies were reviewed today:   EKG:  EKG is  ordered today.  The ekg ordered today demonstrates sinus rhythm, rate 102, no ST/T wave abnormalities  Recent Labs: 11/11/2018: ALT 11 04/18/2019: BUN 15; Creatinine, Ser 0.76; Hemoglobin 12.0; Platelets 278; Potassium 3.7; Sodium 138  Recent Lipid Panel    Component Value Date/Time   CHOL  156 11/06/2017 1510   TRIG 104 11/06/2017 1510   HDL 49 11/06/2017 1510   CHOLHDL 3.2 11/06/2017 1510   CHOLHDL 3 01/07/2013 1558   VLDL 8.6 01/07/2013 1558   LDLCALC 86 11/06/2017 1510    Physical Exam:    VS:  BP 130/80   Pulse (!) 102   Temp 97.7 F (36.5 C)   Ht 5' 10"  (1.778 m)   Wt 242 lb 12.8 oz (110.1 kg)   LMP 05/27/2019   SpO2 98%   BMI 34.84 kg/m     Wt Readings from Last 3 Encounters:  09/14/19 242 lb 12.8 oz (110.1 kg)  09/09/19 242 lb 9.6 oz (110 kg)  08/26/19 238 lb 8 oz (108.2 kg)  GEN:   in no acute distress HEENT: Normal NECK: No JVD LYMPHATICS: No lymphadenopathy CARDIAC: RRR, no murmurs, rubs, gallops RESPIRATORY:  Clear to auscultation without rales, wheezing or rhonchi  ABDOMEN: Soft, non-tender, non-distended MUSCULOSKELETAL:  No edema SKIN: Warm and dry NEUROLOGIC:  Alert and oriented x 3 PSYCHIATRIC:  Normal affect   ASSESSMENT:    1. Abnormal cardiovascular stress test   2. Essential hypertension    PLAN:    Hypertension: Started on nifedipine at last clinic visit with obstetrician.  Has not started taking it.  Encourage patient to start nifedipine and follow-up with her OB as scheduled  Abnormal stress test: Nuclear stress test 11/2018 showed partially reversible anterior defect, could represent artifact.  Denies any recent chest pain.  Will obtain records from cardiologist at [facility] of cardiac work-up done last year, including echo and stress EKG  RTC in 6 weeks   Medication Adjustments/Labs and Tests Ordered: Current medicines are reviewed at length with the patient today.  Concerns regarding medicines are outlined above.  No orders of the defined types were placed in this encounter.  No orders of the defined types were placed in this encounter.   Patient Instructions  Medication Instructions:  Your physician recommends that you continue on your current medications as directed. Please refer to the Current  Medication list given to you today.  Lab Work: NONE  Testing/Procedures: We will request test results from [place]: At [facility], you and your health needs are our priority.  As part of our continuing mission to provide you with exceptional heart care, we have created designated Provider Care Teams.  These Care Teams include your primary Cardiologist (physician) and Advanced Practice Providers (APPs -  Physician Assistants and Nurse Practitioners) who all work together to provide you with the care you need, when you need it.  We recommend signing up for the patient portal called "MyChart".  Sign up information is provided on this After Visit Summary.  MyChart is used to connect with patients for Virtual Visits (Telemedicine).  Patients are able to view lab/test results, encounter notes, upcoming appointments, etc.  Non-urgent messages can be sent to your provider as well.   To learn more about what you can do with MyChart, go to [email or web address].    Your next appointment:   6 week(s)  The format for your next appointment:   In Person  Provider:   Oswaldo Milian, MD         Signed, Donato Heinz, MD  09/14/2019 5:41 PM    [place]

## 2019-09-14 ENCOUNTER — Ambulatory Visit (INDEPENDENT_AMBULATORY_CARE_PROVIDER_SITE_OTHER): Payer: Medicaid Other | Admitting: Cardiology

## 2019-09-14 ENCOUNTER — Encounter: Payer: Self-pay | Admitting: Cardiology

## 2019-09-14 VITALS — BP 130/80 | HR 102 | Temp 97.7°F | Ht 70.0 in | Wt 242.8 lb

## 2019-09-14 DIAGNOSIS — I10 Essential (primary) hypertension: Secondary | ICD-10-CM

## 2019-09-14 DIAGNOSIS — R9439 Abnormal result of other cardiovascular function study: Secondary | ICD-10-CM

## 2019-09-14 NOTE — Patient Instructions (Signed)
Medication Instructions:  Your physician recommends that you continue on your current medications as directed. Please refer to the Current Medication list given to you today.  Lab Work: NONE  Testing/Procedures: We will request test results from [facility]  Follow-Up: At [facility], you and your health needs are our priority.  As part of our continuing mission to provide you with exceptional heart care, we have created designated Provider Care Teams.  These Care Teams include your primary Cardiologist (physician) and Advanced Practice Providers (APPs -  Physician Assistants and Nurse Practitioners) who all work together to provide you with the care you need, when you need it.  We recommend signing up for the patient portal called "MyChart".  Sign up information is provided on this After Visit Summary.  MyChart is used to connect with patients for Virtual Visits (Telemedicine).  Patients are able to view lab/test results, encounter notes, upcoming appointments, etc.  Non-urgent messages can be sent to your provider as well.   To learn more about what you can do with MyChart, go to [email or web address].    Your next appointment:   6 week(s)  The format for your next appointment:   In Person  Provider:   Epifanio Lesches, MD

## 2019-09-21 LAB — COMPREHENSIVE METABOLIC PANEL
ALT: 11 IU/L (ref 0–32)
AST: 15 IU/L (ref 0–40)
Albumin/Globulin Ratio: 1.4 (ref 1.2–2.2)
Albumin: 4 g/dL (ref 3.8–4.8)
Alkaline Phosphatase: 59 IU/L (ref 39–117)
BUN/Creatinine Ratio: 16 (ref 9–23)
BUN: 9 mg/dL (ref 6–20)
Bilirubin Total: 0.2 mg/dL (ref 0.0–1.2)
CO2: 17 mmol/L — ABNORMAL LOW (ref 20–29)
Calcium: 8.7 mg/dL (ref 8.7–10.2)
Chloride: 104 mmol/L (ref 96–106)
Creatinine, Ser: 0.58 mg/dL (ref 0.57–1.00)
GFR calc Af Amer: 140 mL/min/{1.73_m2} (ref >59)
GFR calc non Af Amer: 121 mL/min/{1.73_m2} (ref >59)
Globulin, Total: 2.8 g/dL (ref 1.5–4.5)
Glucose: 74 mg/dL (ref 65–99)
Potassium: 4 mmol/L (ref 3.5–5.2)
Sodium: 137 mmol/L (ref 134–144)
Total Protein: 6.8 g/dL (ref 6.0–8.5)

## 2019-09-21 LAB — OBSTETRIC PANEL, INCLUDING HIV
Antibody Screen: NEGATIVE
Basophils Absolute: 0 10*3/uL (ref 0.0–0.2)
Basos: 0 %
EOS (ABSOLUTE): 0.1 10*3/uL (ref 0.0–0.4)
Eos: 2 %
HIV Screen 4th Generation wRfx: NONREACTIVE
Hematocrit: 37.4 % (ref 34.0–46.6)
Hemoglobin: 12 g/dL (ref 11.1–15.9)
Hepatitis B Surface Ag: NEGATIVE
Immature Grans (Abs): 0 10*3/uL (ref 0.0–0.1)
Immature Granulocytes: 0 %
Lymphocytes Absolute: 1 10*3/uL (ref 0.7–3.1)
Lymphs: 15 %
MCH: 25.7 pg — ABNORMAL LOW (ref 26.6–33.0)
MCHC: 32.1 g/dL (ref 31.5–35.7)
MCV: 80 fL (ref 79–97)
Monocytes Absolute: 0.4 10*3/uL (ref 0.1–0.9)
Monocytes: 6 %
Neutrophils Absolute: 5.1 10*3/uL (ref 1.4–7.0)
Neutrophils: 77 %
Platelets: 225 10*3/uL (ref 150–450)
RBC: 4.67 x10E6/uL (ref 3.77–5.28)
RDW: 16.2 % — ABNORMAL HIGH (ref 11.7–15.4)
RPR Ser Ql: NONREACTIVE
Rh Factor: POSITIVE
Rubella Antibodies, IGG: 4.62 index (ref >0.99)
WBC: 6.6 10*3/uL (ref 3.4–10.8)

## 2019-09-21 LAB — ANTIPHOSPHOLIPID SYNDROME COMP
APTT: 28 s
Anticardiolipin Ab, IgA: <10 [APL'U]
Anticardiolipin Ab, IgG: <10 [GPL'U]
Anticardiolipin Ab, IgM: 13 [MPL'U]
Antiphosphatidylserine IgG: 0 {GPS'U}
Antiphosphatidylserine IgM: 1 {MPS'U}
Antiprothrombin Antibody, IgG: 4 G units
Beta-2 Glycoprotein I, IgA: <10 SAU
Beta-2 Glycoprotein I, IgG: <10 SGU
Beta-2 Glycoprotein I, IgM: <10 SMU
DRVVT Screen Seconds: 30.2 s
Hexagonal Phospholipid Neutral: 0 s
Platelet Neutralization: 0 s

## 2019-09-21 LAB — HEMOGLOBIN A1C
Est. average glucose Bld gHb Est-mCnc: 128 mg/dL
Hgb A1c MFr Bld: 6.1 % — ABNORMAL HIGH (ref 4.8–5.6)

## 2019-09-21 LAB — VITAMIN D 25 HYDROXY (VIT D DEFICIENCY, FRACTURES): Vit D, 25-Hydroxy: 38.8 ng/mL (ref 30.0–100.0)

## 2019-09-21 LAB — TSH: TSH: 2.11 u[IU]/mL (ref 0.450–4.500)

## 2019-09-23 ENCOUNTER — Encounter: Payer: Self-pay | Admitting: *Deleted

## 2019-09-29 ENCOUNTER — Other Ambulatory Visit: Payer: Self-pay

## 2019-09-29 ENCOUNTER — Ambulatory Visit (INDEPENDENT_AMBULATORY_CARE_PROVIDER_SITE_OTHER): Payer: Medicaid Other | Admitting: Family Medicine

## 2019-09-29 VITALS — BP 134/85 | HR 114 | Wt 240.6 lb

## 2019-09-29 DIAGNOSIS — Z8759 Personal history of other complications of pregnancy, childbirth and the puerperium: Secondary | ICD-10-CM

## 2019-09-29 DIAGNOSIS — O10919 Unspecified pre-existing hypertension complicating pregnancy, unspecified trimester: Secondary | ICD-10-CM

## 2019-09-29 DIAGNOSIS — O0990 Supervision of high risk pregnancy, unspecified, unspecified trimester: Secondary | ICD-10-CM

## 2019-09-29 DIAGNOSIS — J302 Other seasonal allergic rhinitis: Secondary | ICD-10-CM

## 2019-09-29 DIAGNOSIS — O219 Vomiting of pregnancy, unspecified: Secondary | ICD-10-CM

## 2019-09-29 DIAGNOSIS — O34219 Maternal care for unspecified type scar from previous cesarean delivery: Secondary | ICD-10-CM

## 2019-09-29 DIAGNOSIS — O24312 Unspecified pre-existing diabetes mellitus in pregnancy, second trimester: Secondary | ICD-10-CM

## 2019-09-29 DIAGNOSIS — O99512 Diseases of the respiratory system complicating pregnancy, second trimester: Secondary | ICD-10-CM

## 2019-09-29 DIAGNOSIS — O10912 Unspecified pre-existing hypertension complicating pregnancy, second trimester: Secondary | ICD-10-CM

## 2019-09-29 DIAGNOSIS — Z98891 History of uterine scar from previous surgery: Secondary | ICD-10-CM

## 2019-09-29 DIAGNOSIS — Z3A17 17 weeks gestation of pregnancy: Secondary | ICD-10-CM

## 2019-09-29 MED ORDER — PREPLUS 27-1 MG PO TABS: 1.0000 | ORAL_TABLET | Freq: Every day | ORAL | 13 refills | Status: DC

## 2019-09-29 MED ORDER — CETIRIZINE HCL 5 MG PO TABS: 5.0000 mg | ORAL_TABLET | Freq: Every day | ORAL | 1 refills | Status: DC

## 2019-09-29 MED ORDER — DOXYLAMINE-PYRIDOXINE 10-10 MG PO TBEC: 2.0000 | DELAYED_RELEASE_TABLET | Freq: Every day | ORAL | 5 refills | Status: DC

## 2019-09-29 MED ORDER — METFORMIN HCL 1000 MG PO TABS: 1000.0000 mg | ORAL_TABLET | Freq: Two times a day (BID) | ORAL | 2 refills | Status: DC

## 2019-09-29 NOTE — Patient Instructions (Signed)

## 2019-09-29 NOTE — Progress Notes (Signed)
   PRENATAL VISIT NOTE  Subjective:  Joyce Green is a 34 y.o. [id number] at [redacted]w[redacted]d being seen today for ongoing prenatal care.  She is currently monitored for the following issues for this high-risk pregnancy and has Obesity (BMI 30-39.9); Obesity in pregnancy; DM (diabetes mellitus), type 2 (HCC); Benign essential HTN; Chronic hypertension affecting pregnancy; Supervision of high risk pregnancy, antepartum; Preexisting diabetes complicating pregnancy, antepartum; History of IUFD; History of VBAC; Abnormal cardiovascular stress test; and Medication exposure during first trimester of pregnancy on their problem list.  Patient reports continued nausea and some vomiting. Also reports allergies when working outside.  Contractions: Not present. Vag. Bleeding: None.  Movement: Present. Denies leaking of fluid.   The following portions of the patient's history were reviewed and updated as appropriate: allergies, current medications, past family history, past medical history, past social history, past surgical history and problem list.   Objective:   Vitals:   09/29/19 1630  BP: 134/85  Pulse: (!) 114  Weight: 240 lb 9.6 oz (109.1 kg)    Fetal Status: Fetal Heart Rate (bpm): 153   Movement: Present     General:  Alert, oriented and cooperative. Patient is in no acute distress.  Skin: Skin is warm and dry. No rash noted.   Cardiovascular: Normal heart rate noted  Respiratory: Normal respiratory effort, no problems with respiration noted  Abdomen: Soft, gravid, appropriate for gestational age.  Pain/Pressure: Absent     Pelvic: Cervical exam deferred        Extremities: Normal range of motion.  Edema: Trace  Mental Status: Normal mood and affect. Normal behavior. Normal judgment and thought content.   Assessment and Plan:  Pregnancy: [id number] at [redacted]w[redacted]d 1. Supervision of high risk pregnancy, antepartum - RTC in 4 weeks - Anatomy US scheduled next week   2. Chronic hypertension affecting  pregnancy - started on Procardia 3/31 - stable  - cont Procardia XL 30 mg qday - Cont ASA - Pr/Cr and CMP WNL on initial labs   3. Preexisting diabetes complicating pregnancy in second trimester, antepartum - A1c 6.1 on initial labs but previously 9.7 in May 2019 - Cont Metformin 1000 mg BID; refill sent  - Reviewed sugars for last week and only one value out of range at 135 otherwise at goal for fasting and PP values   4. History of IUFD - ALPA WNL - antenatal testing in third trimester  5. Previous cesarean delivery affecting pregnancy History of VBAC - planning repeat TOLAC  6. Seasonal Allergies - Zyrtec prescribed  7. Nausea and Vomiting in Pregnancy - Will trial Diclegis  Preterm labor symptoms and general obstetric precautions including but not limited to vaginal bleeding, contractions, leaking of fluid and fetal movement were reviewed in detail with the patient. Please refer to After Visit Summary for other counseling recommendations.   Return in about 4 weeks (around 10/27/2019) for [facility]; in-person.  Future Appointments  Date Time Provider Department Center  10/07/2019  2:15 PM [facility] NURSE WH-MFC MFC-US  10/07/2019  2:15 PM WH-MFC [place] [facility] WH-MFCUS MFC-US  10/26/2019  2:00 PM Little Ishikawa, MD [facility]  10/28/2019  3:55 PM Neoga Bing, MD [facility]    Joselyn Arrow, MD

## 2019-09-29 NOTE — Progress Notes (Signed)
Pt states has been recording Glucose levels in her phone.

## 2019-09-30 ENCOUNTER — Encounter: Payer: Medicaid Other | Admitting: Family Medicine

## 2019-10-07 ENCOUNTER — Other Ambulatory Visit (HOSPITAL_COMMUNITY): Payer: Self-pay | Admitting: *Deleted

## 2019-10-07 ENCOUNTER — Ambulatory Visit (HOSPITAL_COMMUNITY): Payer: Medicaid Other

## 2019-10-07 ENCOUNTER — Encounter: Payer: Self-pay | Admitting: *Deleted

## 2019-10-07 ENCOUNTER — Ambulatory Visit (HOSPITAL_COMMUNITY): Payer: Medicaid Other | Admitting: *Deleted

## 2019-10-07 ENCOUNTER — Encounter (HOSPITAL_COMMUNITY): Payer: Self-pay

## 2019-10-07 ENCOUNTER — Ambulatory Visit (HOSPITAL_COMMUNITY)
Admission: RE | Admit: 2019-10-07 | Discharge: 2019-10-07 | Disposition: A | Discharge status: Home / Self Care | Payer: Medicaid Other | Source: Ambulatory Visit | Attending: Obstetrics and Gynecology | Admitting: Obstetrics and Gynecology

## 2019-10-07 ENCOUNTER — Other Ambulatory Visit: Payer: Self-pay

## 2019-10-07 ENCOUNTER — Other Ambulatory Visit (HOSPITAL_COMMUNITY): Payer: Medicaid Other

## 2019-10-07 DIAGNOSIS — Z3A19 19 weeks gestation of pregnancy: Secondary | ICD-10-CM

## 2019-10-07 DIAGNOSIS — E669 Obesity, unspecified: Secondary | ICD-10-CM

## 2019-10-07 DIAGNOSIS — Z363 Encounter for antenatal screening for malformations: Secondary | ICD-10-CM | POA: Diagnosis not present

## 2019-10-07 DIAGNOSIS — O24112 Pre-existing diabetes mellitus, type 2, in pregnancy, second trimester: Secondary | ICD-10-CM

## 2019-10-07 DIAGNOSIS — Z8759 Personal history of other complications of pregnancy, childbirth and the puerperium: Secondary | ICD-10-CM | POA: Diagnosis present

## 2019-10-07 DIAGNOSIS — O24319 Unspecified pre-existing diabetes mellitus in pregnancy, unspecified trimester: Secondary | ICD-10-CM

## 2019-10-07 DIAGNOSIS — O0992 Supervision of high risk pregnancy, unspecified, second trimester: Secondary | ICD-10-CM | POA: Diagnosis not present

## 2019-10-07 DIAGNOSIS — O0990 Supervision of high risk pregnancy, unspecified, unspecified trimester: Secondary | ICD-10-CM

## 2019-10-07 DIAGNOSIS — Z794 Long term (current) use of insulin: Secondary | ICD-10-CM

## 2019-10-07 DIAGNOSIS — O10919 Unspecified pre-existing hypertension complicating pregnancy, unspecified trimester: Secondary | ICD-10-CM

## 2019-10-26 ENCOUNTER — Other Ambulatory Visit: Payer: Self-pay

## 2019-10-26 ENCOUNTER — Encounter: Payer: Self-pay | Admitting: Cardiology

## 2019-10-26 ENCOUNTER — Ambulatory Visit: Payer: Medicaid Other | Admitting: Cardiology

## 2019-10-26 VITALS — BP 128/88 | HR 104 | Temp 97.0°F | Ht 70.0 in | Wt 246.0 lb

## 2019-10-26 DIAGNOSIS — I10 Essential (primary) hypertension: Secondary | ICD-10-CM | POA: Diagnosis not present

## 2019-10-26 DIAGNOSIS — R9439 Abnormal result of other cardiovascular function study: Secondary | ICD-10-CM | POA: Diagnosis not present

## 2019-10-26 NOTE — Progress Notes (Signed)
Cardiology Office Note:    Date:  10/27/2019   ID:  Joyce Green, DOB 28-Sep-1985, MRN [id number]  PCP:  Simona Huh, NP  Cardiologist:  Donato Heinz, MD  Electrophysiologist:  None   Referring MD: Simona Huh, NP   Chief Complaint  Patient presents with  . Shortness of Breath    History of Present Illness:    Joyce Green is a 34 y.o. female with a hx of hypertension, type 2 diabetes who presents for follow-up.  She was referred by Dr. Ilda Basset for evaluation of hypertension.  She is currently [redacted] weeks pregnant.  She previously had seen a cardiologist at [facility].  Had episode of chest pain which she was seen in the ED 04/2019.  Reports had TTE done last year. TTE 11/18/2012 showed normal LVEF, mild LVH.  Underwent nuclear stress test 11/12/2018 that showed partially reversible anterior wall defect.  CTPA on 05/29/2018 showed no PE but did note borderline prominent main pulmonary outflow tract concerning for underlying pulmonary hypertension.  She currently denies any recent chest pain.  Reports during first trimester having significant dyspnea.  However that has improved, currently no shortness of breath.  She was prescribed last week nifedipine by her obstetrician, but has not started taking it yet.  Reports having palpitations last year but none in the last 3 to 4 months.  Reports that she smokes marijuana 2-3 times per year.  No cigarettes.  No family history of heart disease in her immediate family  TTE on 10/22/2018 showed LVEF 60 to 65%, normal RV function, mild LVH.  Since last clinic visit, she reports that she has been doing well.  Denies any chest pain.  Does note some shortness of breath.  No palpitations.  Reports BP has been controlled on nifedipine.  A1c 6.1 08/26/19.  She reports that prior to pregnancy was walking about once per week for 3 miles.  Denies any exertional chest pain or dyspnea   Past Medical History:  Diagnosis Date  .  Bacteremia 03/2018   approximately 10 days after vbac  . Diabetes mellitus without complication ([place])    T2  . Hypertension   . Palpitations     Past Surgical History:  Procedure Laterality Date  . CESAREAN SECTION N/A 02/20/2014   Procedure: CESAREAN SECTION;  Surgeon: Florian Buff, MD;  Location: [place] [facility];  Service: Obstetrics;  Laterality: N/A;  . CESAREAN SECTION N/A 07/28/2016   Procedure: CESAREAN SECTION;  Surgeon: Mora Bellman, MD;  Location: [place];  Service: Obstetrics;  Laterality: N/A;    Current Medications: Current Meds  Medication Sig  . ACCU-CHEK FASTCLIX LANCETS MISC 1 Device by Percutaneous route 4 (four) times daily.  Marland Kitchen aspirin EC 81 MG tablet Take 1 tablet (81 mg total) by mouth daily.  . Blood Glucose Monitoring Suppl (ACCU-CHEK NANO SMARTVIEW) w/Device KIT 1 kit by Subdermal route as directed. Check blood sugars for fasting, and two hours after breakfast, lunch and dinner (4 checks daily)  . Blood Pressure Monitoring (BLOOD PRESSURE KIT) DEVI 1 Device by Does not apply route as needed.  . cetirizine (ZYRTEC) 5 MG tablet Take 1 tablet (5 mg total) by mouth daily.  . Doxylamine-Pyridoxine (DICLEGIS) 10-10 MG TBEC Take 2 tablets by mouth at bedtime. If symptoms persist, add one tablet in the morning and one in the afternoon  . glucose blood (ACCU-CHEK SMARTVIEW) test strip Use as instructed to check blood sugars  . glucose blood test strip Use as instructed  .  insulin glargine (LANTUS) 100 unit/mL SOPN Inject 25 Units into the skin daily.  . magnesium 30 MG tablet Take 30 mg by mouth daily.  . metFORMIN (GLUCOPHAGE) 1000 MG tablet Take 1 tablet (1,000 mg total) by mouth 2 (two) times daily with a meal.  . NIFEdipine (PROCARDIA-XL/NIFEDICAL-XL) 30 MG 24 hr tablet Take 1 tablet (30 mg total) by mouth daily. Can increase to twice a day as needed for symptomatic contractions  . Prenatal Vit-Fe Fumarate-FA (PRENATAL PO) Take by mouth.  . Prenatal Vit-Fe  Fumarate-FA (PREPLUS) 27-1 MG TABS Take 1 tablet by mouth daily.     Allergies:   Patient has no known allergies.   Social History   Socioeconomic History  . Marital status: Married    Spouse name: Not on file  . Number of children: Not on file  . Years of education: Not on file  . Highest education level: Not on file  Occupational History  . Not on file  Tobacco Use  . Smoking status: Former Smoker    Packs/day: 0.00    Years: 0.00    Pack years: 0.00    Types: Cigars    Quit date: 06/11/2009    Years since quitting: 10.3  . Smokeless tobacco: Never Used  . Tobacco comment: not while pregnant  Substance and Sexual Activity  . Alcohol use: Not Currently    Alcohol/week: 4.0 standard drinks    Types: 2 Glasses of wine, 2 Cans of beer per week    Comment: occasional; stopped after found out she was pregnant  . Drug use: Not Currently    Types: Marijuana    Comment: per prenatal record, +THC on initial visit u/a; no current use  . Sexual activity: Yes    Partners: Male    Birth control/protection: None  Other Topics Concern  . Not on file  Social History Narrative  . Not on file   Social Determinants of Health   Financial Resource Strain:   . Difficulty of Paying Living Expenses:   Food Insecurity: No Food Insecurity  . Worried About Charity fundraiser in the Last Year: Never true  . Ran Out of Food in the Last Year: Never true  Transportation Needs: No Transportation Needs  . Lack of Transportation (Medical): No  . Lack of Transportation (Non-Medical): No  Physical Activity:   . Days of Exercise per Week:   . Minutes of Exercise per Session:   Stress:   . Feeling of Stress :   Social Connections:   . Frequency of Communication with Friends and Family:   . Frequency of Social Gatherings with Friends and Family:   . Attends Religious Services:   . Active Member of Clubs or Organizations:   . Attends Archivist Meetings:   Marland Kitchen Marital Status:       Family History: The patient's family history includes Cancer in her maternal grandfather, maternal grandmother, paternal grandfather, and paternal grandmother; Diabetes in her mother; Diabetes Mellitus I in her mother; Hypertension in her father and mother; Lung cancer in her father. There is no history of Early death, Heart disease, Hyperlipidemia, Kidney disease, or Stroke.  ROS:   Please see the history of present illness.    All other systems reviewed and are negative.  EKGs/Labs/Other Studies Reviewed:    The following studies were reviewed today:   EKG:  EKG is  ordered today.  The ekg ordered today demonstrates sinus rhythm, rate 104, low voltage, no ST/T wave abnormalities  Recent Labs: 08/26/2019: ALT 11; BUN 9; Creatinine, Ser 0.58; Hemoglobin 12.0; Platelets 225; Potassium 4.0; Sodium 137; TSH 2.110  Recent Lipid Panel    Component Value Date/Time   CHOL 156 11/06/2017 1510   TRIG 104 11/06/2017 1510   HDL 49 11/06/2017 1510   CHOLHDL 3.2 11/06/2017 1510   CHOLHDL 3 01/07/2013 1558   VLDL 8.6 01/07/2013 1558   LDLCALC 86 11/06/2017 1510    Physical Exam:    VS:  BP 128/88   Pulse (!) 104   Temp (!) 97 F (36.1 C)   Ht _0  (1.778 m)   Wt 246 lb (111.6 kg)   LMP 05/27/2019   SpO2 99%   BMI 35.30 kg/m     Wt Readings from Last 3 Encounters:  10/26/19 246 lb (111.6 kg)  09/29/19 240 lb 9.6 oz (109.1 kg)  09/14/19 242 lb 12.8 oz (110.1 kg)     GEN:   in no acute distress HEENT: Normal NECK: No JVD LYMPHATICS: No lymphadenopathy CARDIAC: RRR, no murmurs, rubs, gallops RESPIRATORY:  Clear to auscultation without rales, wheezing or rhonchi  ABDOMEN: Soft, non-tender, non-distended MUSCULOSKELETAL:  No edema SKIN: Warm and dry NEUROLOGIC:  Alert and oriented x 3 PSYCHIATRIC:  Normal affect   ASSESSMENT:    1. Essential hypertension   2. Abnormal cardiovascular stress test    PLAN:    Hypertension: Started on nifedipine by obstetrician.  BP  appears controlled  Abnormal stress test: Nuclear stress test 11/2018 showed partially reversible anterior defect, could represent artifact.  Denies any recent chest pain.  Echo shows no significant abnormality  T2DM: on insulin.  A1c 6.1.    RTC in 6 months   Medication Adjustments/Labs and Tests Ordered: Current medicines are reviewed at length with the patient today.  Concerns regarding medicines are outlined above.  No orders of the defined types were placed in this encounter.  No orders of the defined types were placed in this encounter.   Patient Instructions  Medication Instructions:  Your physician recommends that you continue on your current medications as directed. Please refer to the Current Medication list given to you today.  Follow-Up: At [facility], you and your health needs are our priority.  As part of our continuing mission to provide you with exceptional heart care, we have created designated Provider Care Teams.  These Care Teams include your primary Cardiologist (physician) and Advanced Practice Providers (APPs -  Physician Assistants and Nurse Practitioners) who all work together to provide you with the care you need, when you need it.  We recommend signing up for the patient portal called "MyChart".  Sign up information is provided on this After Visit Summary.  MyChart is used to connect with patients for Virtual Visits (Telemedicine).  Patients are able to view lab/test results, encounter notes, upcoming appointments, etc.  Non-urgent messages can be sent to your provider as well.   To learn more about what you can do with MyChart, go to [email or web address].    Your next appointment:   6 month(s)  The format for your next appointment:   In Person  Provider:   Oswaldo Milian, MD         Signed, Donato Heinz, MD  10/27/2019 6:01 PM    [place]

## 2019-10-26 NOTE — Patient Instructions (Signed)
Medication Instructions:  Your physician recommends that you continue on your current medications as directed. Please refer to the Current Medication list given to you today.  Follow-Up: At [facility], you and your health needs are our priority.  As part of our continuing mission to provide you with exceptional heart care, we have created designated Provider Care Teams.  These Care Teams include your primary Cardiologist (physician) and Advanced Practice Providers (APPs -  Physician Assistants and Nurse Practitioners) who all work together to provide you with the care you need, when you need it.  We recommend signing up for the patient portal called "MyChart".  Sign up information is provided on this After Visit Summary.  MyChart is used to connect with patients for Virtual Visits (Telemedicine).  Patients are able to view lab/test results, encounter notes, upcoming appointments, etc.  Non-urgent messages can be sent to your provider as well.   To learn more about what you can do with MyChart, go to [email or web address]://[email or web address].    Your next appointment:   6 month(s)  The format for your next appointment:   In Person  Provider:   Christopher Schumann, MD     

## 2019-10-28 ENCOUNTER — Other Ambulatory Visit: Payer: Self-pay

## 2019-10-28 ENCOUNTER — Ambulatory Visit (INDEPENDENT_AMBULATORY_CARE_PROVIDER_SITE_OTHER): Payer: Medicaid Other | Admitting: Obstetrics & Gynecology

## 2019-10-28 VITALS — BP 125/86 | HR 108 | Wt 243.0 lb

## 2019-10-28 DIAGNOSIS — O10919 Unspecified pre-existing hypertension complicating pregnancy, unspecified trimester: Secondary | ICD-10-CM

## 2019-10-28 DIAGNOSIS — Z98891 History of uterine scar from previous surgery: Secondary | ICD-10-CM

## 2019-10-28 DIAGNOSIS — O10912 Unspecified pre-existing hypertension complicating pregnancy, second trimester: Secondary | ICD-10-CM

## 2019-10-28 DIAGNOSIS — Z3A22 22 weeks gestation of pregnancy: Secondary | ICD-10-CM

## 2019-10-28 DIAGNOSIS — O34219 Maternal care for unspecified type scar from previous cesarean delivery: Secondary | ICD-10-CM

## 2019-10-28 DIAGNOSIS — O24112 Pre-existing diabetes mellitus, type 2, in pregnancy, second trimester: Secondary | ICD-10-CM

## 2019-10-28 DIAGNOSIS — O0990 Supervision of high risk pregnancy, unspecified, unspecified trimester: Secondary | ICD-10-CM

## 2019-10-28 DIAGNOSIS — O24319 Unspecified pre-existing diabetes mellitus in pregnancy, unspecified trimester: Secondary | ICD-10-CM

## 2019-10-28 DIAGNOSIS — E119 Type 2 diabetes mellitus without complications: Secondary | ICD-10-CM

## 2019-10-28 DIAGNOSIS — O24312 Unspecified pre-existing diabetes mellitus in pregnancy, second trimester: Secondary | ICD-10-CM

## 2019-10-28 MED ORDER — METFORMIN HCL 1000 MG PO TABS: 1000.0000 mg | ORAL_TABLET | Freq: Two times a day (BID) | ORAL | 2 refills | Status: DC

## 2019-10-28 NOTE — Progress Notes (Signed)
  Please refill metformin per patient

## 2019-10-28 NOTE — Progress Notes (Signed)
   PRENATAL VISIT NOTE  Subjective:  Joyce Green is a 34 y.o. G4P2102 at [redacted]w[redacted]d being seen today for ongoing prenatal care.  She is currently monitored for the following issues for this high-risk pregnancy and has Obesity (BMI 30-39.9); Obesity in pregnancy; DM (diabetes mellitus), type 2 (HCC); Benign essential HTN; Chronic hypertension affecting pregnancy; Supervision of high risk pregnancy, antepartum; Preexisting diabetes complicating pregnancy, antepartum; History of IUFD; History of VBAC; Abnormal cardiovascular stress test; and Medication exposure during first trimester of pregnancy on their problem list.  Patient reports no complaints.  Contractions: Not present. Vag. Bleeding: None.  Movement: Present. Denies leaking of fluid.   The following portions of the patient's history were reviewed and updated as appropriate: allergies, current medications, past family history, past medical history, past social history, past surgical history and problem list.   Objective:   Vitals:   10/28/19 1609  BP: 125/86  Pulse: (!) 108  Weight: 243 lb (110.2 kg)    Fetal Status: Fetal Heart Rate (bpm): 154   Movement: Present     General:  Alert, oriented and cooperative. Patient is in no acute distress.  Skin: Skin is warm and dry. No rash noted.   Cardiovascular: Normal heart rate noted  Respiratory: Normal respiratory effort, no problems with respiration noted  Abdomen: Soft, gravid, appropriate for gestational age.  Pain/Pressure: Absent     Pelvic: Cervical exam deferred        Extremities: Normal range of motion.  Edema: None  Mental Status: Normal mood and affect. Normal behavior. Normal judgment and thought content.   Assessment and Plan:  Pregnancy: G4P2102 at [redacted]w[redacted]d 1. Preexisting diabetes complicating pregnancy in second trimester, antepartum Refill medication - metFORMIN (GLUCOPHAGE) 1000 MG tablet; Take 1 tablet (1,000 mg total) by mouth 2 (two) times daily with a meal.   Dispense: 90 tablet; Refill: 2 - US Fetal Echocardiography; Future BP nl Preterm labor symptoms and general obstetric precautions including but not limited to vaginal bleeding, contractions, leaking of fluid and fetal movement were reviewed in detail with the patient. Please refer to After Visit Summary for other counseling recommendations.   Return in about 3 weeks (around 11/18/2019) for fetal echo needs schedule.  Future Appointments  Date Time Provider Department Center  11/11/2019  2:00 PM [facility] NURSE [facility]  11/11/2019  2:00 PM [facility] US1 [facility]    Scheryl Darter, MD

## 2019-10-28 NOTE — Patient Instructions (Signed)

## 2019-11-04 NOTE — Addendum Note (Signed)
Addended by: Myna Hidalgo A on: 11/04/2019 01:20 PM   Modules accepted: Orders

## 2019-11-11 ENCOUNTER — Other Ambulatory Visit: Payer: Self-pay

## 2019-11-11 ENCOUNTER — Ambulatory Visit: Payer: Medicaid Other | Admitting: *Deleted

## 2019-11-11 ENCOUNTER — Other Ambulatory Visit: Payer: Self-pay | Admitting: *Deleted

## 2019-11-11 ENCOUNTER — Ambulatory Visit (HOSPITAL_COMMUNITY): Payer: Medicaid Other | Attending: Maternal & Fetal Medicine

## 2019-11-11 DIAGNOSIS — O34219 Maternal care for unspecified type scar from previous cesarean delivery: Secondary | ICD-10-CM

## 2019-11-11 DIAGNOSIS — O24319 Unspecified pre-existing diabetes mellitus in pregnancy, unspecified trimester: Secondary | ICD-10-CM | POA: Insufficient documentation

## 2019-11-11 DIAGNOSIS — O0990 Supervision of high risk pregnancy, unspecified, unspecified trimester: Secondary | ICD-10-CM | POA: Diagnosis present

## 2019-11-11 DIAGNOSIS — Z362 Encounter for other antenatal screening follow-up: Secondary | ICD-10-CM

## 2019-11-11 DIAGNOSIS — Z3A24 24 weeks gestation of pregnancy: Secondary | ICD-10-CM

## 2019-11-11 DIAGNOSIS — E669 Obesity, unspecified: Secondary | ICD-10-CM

## 2019-11-11 DIAGNOSIS — O10919 Unspecified pre-existing hypertension complicating pregnancy, unspecified trimester: Secondary | ICD-10-CM

## 2019-11-11 DIAGNOSIS — O24112 Pre-existing diabetes mellitus, type 2, in pregnancy, second trimester: Secondary | ICD-10-CM

## 2019-11-11 DIAGNOSIS — Z8759 Personal history of other complications of pregnancy, childbirth and the puerperium: Secondary | ICD-10-CM | POA: Diagnosis present

## 2019-11-11 DIAGNOSIS — O99212 Obesity complicating pregnancy, second trimester: Secondary | ICD-10-CM

## 2019-11-11 DIAGNOSIS — O09292 Supervision of pregnancy with other poor reproductive or obstetric history, second trimester: Secondary | ICD-10-CM | POA: Diagnosis not present

## 2019-11-23 ENCOUNTER — Encounter: Payer: Medicaid Other | Admitting: Obstetrics & Gynecology

## 2019-12-09 ENCOUNTER — Ambulatory Visit: Payer: Medicaid Other | Attending: Obstetrics and Gynecology

## 2019-12-09 ENCOUNTER — Ambulatory Visit: Payer: Medicaid Other | Admitting: *Deleted

## 2019-12-09 ENCOUNTER — Other Ambulatory Visit: Payer: Self-pay

## 2019-12-09 DIAGNOSIS — Z8759 Personal history of other complications of pregnancy, childbirth and the puerperium: Secondary | ICD-10-CM | POA: Diagnosis present

## 2019-12-09 DIAGNOSIS — O24319 Unspecified pre-existing diabetes mellitus in pregnancy, unspecified trimester: Secondary | ICD-10-CM | POA: Diagnosis present

## 2019-12-09 DIAGNOSIS — O0990 Supervision of high risk pregnancy, unspecified, unspecified trimester: Secondary | ICD-10-CM | POA: Insufficient documentation

## 2019-12-09 DIAGNOSIS — O99213 Obesity complicating pregnancy, third trimester: Secondary | ICD-10-CM | POA: Diagnosis not present

## 2019-12-09 DIAGNOSIS — O10013 Pre-existing essential hypertension complicating pregnancy, third trimester: Secondary | ICD-10-CM

## 2019-12-09 DIAGNOSIS — O09293 Supervision of pregnancy with other poor reproductive or obstetric history, third trimester: Secondary | ICD-10-CM

## 2019-12-09 DIAGNOSIS — Z3A28 28 weeks gestation of pregnancy: Secondary | ICD-10-CM

## 2019-12-09 DIAGNOSIS — E669 Obesity, unspecified: Secondary | ICD-10-CM

## 2019-12-09 DIAGNOSIS — O10919 Unspecified pre-existing hypertension complicating pregnancy, unspecified trimester: Secondary | ICD-10-CM | POA: Diagnosis present

## 2019-12-09 DIAGNOSIS — O34219 Maternal care for unspecified type scar from previous cesarean delivery: Secondary | ICD-10-CM | POA: Diagnosis not present

## 2019-12-10 ENCOUNTER — Other Ambulatory Visit: Payer: Self-pay | Admitting: *Deleted

## 2019-12-10 DIAGNOSIS — O09293 Supervision of pregnancy with other poor reproductive or obstetric history, third trimester: Secondary | ICD-10-CM

## 2019-12-10 DIAGNOSIS — O24119 Pre-existing diabetes mellitus, type 2, in pregnancy, unspecified trimester: Secondary | ICD-10-CM

## 2019-12-17 ENCOUNTER — Encounter: Payer: Self-pay | Admitting: Medical

## 2019-12-17 ENCOUNTER — Other Ambulatory Visit: Payer: Self-pay

## 2019-12-17 ENCOUNTER — Telehealth: Payer: Self-pay

## 2019-12-17 ENCOUNTER — Ambulatory Visit (INDEPENDENT_AMBULATORY_CARE_PROVIDER_SITE_OTHER): Payer: Medicaid Other | Admitting: Medical

## 2019-12-17 VITALS — BP 132/83 | HR 114 | Wt 254.6 lb

## 2019-12-17 DIAGNOSIS — O24113 Pre-existing diabetes mellitus, type 2, in pregnancy, third trimester: Secondary | ICD-10-CM

## 2019-12-17 DIAGNOSIS — Z98891 History of uterine scar from previous surgery: Secondary | ICD-10-CM

## 2019-12-17 DIAGNOSIS — E669 Obesity, unspecified: Secondary | ICD-10-CM

## 2019-12-17 DIAGNOSIS — Z3A29 29 weeks gestation of pregnancy: Secondary | ICD-10-CM

## 2019-12-17 DIAGNOSIS — O24319 Unspecified pre-existing diabetes mellitus in pregnancy, unspecified trimester: Secondary | ICD-10-CM

## 2019-12-17 DIAGNOSIS — O0990 Supervision of high risk pregnancy, unspecified, unspecified trimester: Secondary | ICD-10-CM

## 2019-12-17 DIAGNOSIS — Z8759 Personal history of other complications of pregnancy, childbirth and the puerperium: Secondary | ICD-10-CM

## 2019-12-17 DIAGNOSIS — E119 Type 2 diabetes mellitus without complications: Secondary | ICD-10-CM

## 2019-12-17 DIAGNOSIS — Z23 Encounter for immunization: Secondary | ICD-10-CM

## 2019-12-17 DIAGNOSIS — O10913 Unspecified pre-existing hypertension complicating pregnancy, third trimester: Secondary | ICD-10-CM

## 2019-12-17 DIAGNOSIS — O10919 Unspecified pre-existing hypertension complicating pregnancy, unspecified trimester: Secondary | ICD-10-CM

## 2019-12-17 DIAGNOSIS — O0993 Supervision of high risk pregnancy, unspecified, third trimester: Secondary | ICD-10-CM

## 2019-12-17 DIAGNOSIS — O99210 Obesity complicating pregnancy, unspecified trimester: Secondary | ICD-10-CM

## 2019-12-17 NOTE — Progress Notes (Signed)
Please send referral to [facility] ,fax# [phone]//attn-Stephanie. They are needing Dr's Notes & visits.

## 2019-12-17 NOTE — Patient Instructions (Signed)
Fetal Movement Counts Patient Name: ________________________________________________ Patient Due Date: ____________________ What is a fetal movement count?  A fetal movement count is the number of times that you feel your baby move during a certain amount of time. This may also be called a fetal kick count. A fetal movement count is recommended for every pregnant woman. You may be asked to start counting fetal movements as early as week 28 of your pregnancy. Pay attention to when your baby is most active. You may notice your baby's sleep and wake cycles. You may also notice things that make your baby move more. You should do a fetal movement count:  When your baby is normally most active.  At the same time each day. A good time to count movements is while you are resting, after having something to eat and drink. How do I count fetal movements? 1. Find a quiet, comfortable area. Sit, or lie down on your side. 2. Write down the date, the start time and stop time, and the number of movements that you felt between those two times. Take this information with you to your health care visits. 3. Write down your start time when you feel the first movement. 4. Count kicks, flutters, swishes, rolls, and jabs. You should feel at least 10 movements. 5. You may stop counting after you have felt 10 movements, or if you have been counting for 2 hours. Write down the stop time. 6. If you do not feel 10 movements in 2 hours, contact your health care provider for further instructions. Your health care provider may want to do additional tests to assess your baby's well-being. Contact a health care provider if:  You feel fewer than 10 movements in 2 hours.  Your baby is not moving like he or she usually does. Date: ____________ Start time: ____________ Stop time: ____________ Movements: ____________ Date: ____________ Start time: ____________ Stop time: ____________ Movements: ____________ Date: ____________  Start time: ____________ Stop time: ____________ Movements: ____________ Date: ____________ Start time: ____________ Stop time: ____________ Movements: ____________ Date: ____________ Start time: ____________ Stop time: ____________ Movements: ____________ Date: ____________ Start time: ____________ Stop time: ____________ Movements: ____________ Date: ____________ Start time: ____________ Stop time: ____________ Movements: ____________ Date: ____________ Start time: ____________ Stop time: ____________ Movements: ____________ Date: ____________ Start time: ____________ Stop time: ____________ Movements: ____________ This information is not intended to replace advice given to you by your health care provider. Make sure you discuss any questions you have with your health care provider. Document Revised: 01/15/2019 Document Reviewed: 01/15/2019 Elsevier Patient Education  2020 [employer] Braxton Hicks Contractions Contractions of the uterus can occur throughout pregnancy, but they are not always a sign that you are in labor. You may have practice contractions called Braxton Hicks contractions. These false labor contractions are sometimes confused with true labor. What are Braxton Hicks contractions? Braxton Hicks contractions are tightening movements that occur in the muscles of the uterus before labor. Unlike true labor contractions, these contractions do not result in opening (dilation) and thinning of the cervix. Toward the end of pregnancy (32-34 weeks), Braxton Hicks contractions can happen more often and may become stronger. These contractions are sometimes difficult to tell apart from true labor because they can be very uncomfortable. You should not feel embarrassed if you go to the hospital with false labor. Sometimes, the only way to tell if you are in true labor is for your health care provider to look for changes in the cervix. The health care provider   will do a physical exam and may  monitor your contractions. If you are not in true labor, the exam should show that your cervix is not dilating and your water has not broken. If there are no other health problems associated with your pregnancy, it is completely safe for you to be sent home with false labor. You may continue to have Braxton Hicks contractions until you go into true labor. How to tell the difference between true labor and false labor True labor  Contractions last 30-70 seconds.  Contractions become very regular.  Discomfort is usually felt in the top of the uterus, and it spreads to the lower abdomen and low back.  Contractions do not go away with walking.  Contractions usually become more intense and increase in frequency.  The cervix dilates and gets thinner. False labor  Contractions are usually shorter and not as strong as true labor contractions.  Contractions are usually irregular.  Contractions are often felt in the front of the lower abdomen and in the groin.  Contractions may go away when you walk around or change positions while lying down.  Contractions get weaker and are shorter-lasting as time goes on.  The cervix usually does not dilate or become thin. Follow these instructions at home:   Take over-the-counter and prescription medicines only as told by your health care provider.  Keep up with your usual exercises and follow other instructions from your health care provider.  Eat and drink lightly if you think you are going into labor.  If Braxton Hicks contractions are making you uncomfortable: ? Change your position from lying down or resting to walking, or change from walking to resting. ? Sit and rest in a tub of warm water. ? Drink enough fluid to keep your urine pale yellow. Dehydration may cause these contractions. ? Do slow and deep breathing several times an hour.  Keep all follow-up prenatal visits as told by your health care provider. This is important. Contact a  health care provider if:  You have a fever.  You have continuous pain in your abdomen. Get help right away if:  Your contractions become stronger, more regular, and closer together.  You have fluid leaking or gushing from your vagina.  You pass blood-tinged mucus (bloody show).  You have bleeding from your vagina.  You have low back pain that you never had before.  You feel your baby's head pushing down and causing pelvic pressure.  Your baby is not moving inside you as much as it used to. Summary  Contractions that occur before labor are called Braxton Hicks contractions, false labor, or practice contractions.  Braxton Hicks contractions are usually shorter, weaker, farther apart, and less regular than true labor contractions. True labor contractions usually become progressively stronger and regular, and they become more frequent.  Manage discomfort from Braxton Hicks contractions by changing position, resting in a warm bath, drinking plenty of water, or practicing deep breathing. This information is not intended to replace advice given to you by your health care provider. Make sure you discuss any questions you have with your health care provider. Document Revised: 05/10/2017 Document Reviewed: 10/11/2016 Elsevier Patient Education  2020 Elsevier Inc.  

## 2019-12-17 NOTE — Telephone Encounter (Signed)
Called Dr. Hinton Lovely office to schedule appt for Fetal Echo,. Was told by Judeth Cornfield that pt needed a referral & to fax over Dr's notes on why it's needed.Sent Request to front desk.

## 2019-12-18 NOTE — Progress Notes (Signed)
PRENATAL VISIT NOTE  Subjective:  Joyce Green is a 34 y.o. G4P2102 at [redacted]w[redacted]d being seen today for ongoing prenatal care.  She is currently monitored for the following issues for this high-risk pregnancy and has Obesity (BMI 30-39.9); Obesity in pregnancy; DM (diabetes mellitus), type 2 (HCC); Benign essential HTN; Chronic hypertension affecting pregnancy; Supervision of high risk pregnancy, antepartum; Preexisting diabetes complicating pregnancy, antepartum; History of IUFD; History of VBAC; Abnormal cardiovascular stress test; and Medication exposure during first trimester of pregnancy on their problem list.  Patient reports occasional contractions and mild BH Ctx in the eventings some days.  Contractions: Irritability. Vag. Bleeding: None.  Movement: Present. Denies leaking of fluid.   The following portions of the patient's history were reviewed and updated as appropriate: allergies, current medications, past family history, past medical history, past social history, past surgical history and problem list.   Objective:   Vitals:   12/17/19 1452  BP: 132/83  Pulse: (!) 114  Weight: 254 lb 9.6 oz (115.5 kg)    Fetal Status: Fetal Heart Rate (bpm): 145 Fundal Height: 33 cm Movement: Present     General:  Alert, oriented and cooperative. Patient is in no acute distress.  Skin: Skin is warm and dry. No rash noted.   Cardiovascular: Normal heart rate noted  Respiratory: Normal respiratory effort, no problems with respiration noted  Abdomen: Soft, gravid, appropriate for gestational age.  Pain/Pressure: Present     Pelvic: Cervical exam deferred        Extremities: Normal range of motion.  Edema: Trace  Mental Status: Normal mood and affect. Normal behavior. Normal judgment and thought content.   Assessment and Plan:  Pregnancy: G4P2102 at [redacted]w[redacted]d 1. Supervision of high risk pregnancy, antepartum - Tdap vaccine greater than or equal to 7yo IM  2. History of VBAC - History of 2  prior C/S, first for [facility] and second was due to fetal macrosomia - VBAC was with 33 week IUFD in last pregnancy - Discussed plan for MOD, patient encouraged to consider all options, however repeat C/S is likely the safest option, will plan to discuss in more detail with MD at next visit   3. Preexisting diabetes complicating pregnancy, antepartum - Patient did not have log with her today, encouraged to bring log to all appointments - Reports fasting CBGs 80-85 and PP CBGs 125-135 aside from 1-2 slightly more elevated with know dietary triggers - Continue Metformin and Lantus as previously prescribed  - Weekly BPPs scheduled to start with MFM at 32 weeks  - Serial growth [place] q 4 weeks already scheduled  - Last [place] 7/1 was discussed with patient - EFW 85%, normal AFI  - Previously ordered fetal echo had not been scheduled. CMA is working on this referral today.    4. History of IUFD - @ 33 weeks with last pregnancy   5. Chronic hypertension affecting pregnancy - On procardia and BASA  - Normotensive today   6. Obesity in pregnancy - BASA  - Discussed diet and healthy food choices in pregnancy for appropriate weight gain - Current TWG 26#  Preterm labor symptoms and general obstetric precautions including but not limited to vaginal bleeding, contractions, leaking of fluid and fetal movement were reviewed in detail with the patient. Please refer to After Visit Summary for other counseling recommendations.   Return in about 2 weeks (around 12/31/2019) for [facility] MD only, In-Person.  Future Appointments  Date Time Provider Department Center  12/30/2019  9:55 AM [place],  Billey Gosling, MD [facility]  01/05/2020 12:30 PM [facility] NURSE [facility]  01/05/2020 12:45 PM [facility] US4 [facility]  01/12/2020  1:30 PM [facility]URSE [facility]  01/12/2020  1:45 PM [facility] US5 [facility]  01/13/2020  3:55 PM [place] Bing, MD [facility]  01/27/2020  3:55 PM [place] Bing, MD [facility]  02/04/2020   3:55 PM Hermina Staggers, MD [facility]  02/11/2020  3:55 PM Hermina Staggers, MD [facility]  02/19/2020 10:55 AM Conan Bowens, MD [facility]  02/24/2020  3:55 PM [place] Bing, MD [facility]  03/02/2020  3:55 PM Hermina Staggers, MD [facility]    Vonzella Nipple, PA-C

## 2019-12-30 ENCOUNTER — Ambulatory Visit (INDEPENDENT_AMBULATORY_CARE_PROVIDER_SITE_OTHER): Payer: Medicaid Other | Admitting: Obstetrics and Gynecology

## 2019-12-30 ENCOUNTER — Other Ambulatory Visit: Payer: Self-pay

## 2019-12-30 VITALS — BP 133/84 | HR 108 | Wt 258.7 lb

## 2019-12-30 DIAGNOSIS — O24113 Pre-existing diabetes mellitus, type 2, in pregnancy, third trimester: Secondary | ICD-10-CM

## 2019-12-30 DIAGNOSIS — Z3A31 31 weeks gestation of pregnancy: Secondary | ICD-10-CM | POA: Diagnosis not present

## 2019-12-30 DIAGNOSIS — E669 Obesity, unspecified: Secondary | ICD-10-CM

## 2019-12-30 DIAGNOSIS — O10919 Unspecified pre-existing hypertension complicating pregnancy, unspecified trimester: Secondary | ICD-10-CM

## 2019-12-30 DIAGNOSIS — O99210 Obesity complicating pregnancy, unspecified trimester: Secondary | ICD-10-CM

## 2019-12-30 DIAGNOSIS — O0990 Supervision of high risk pregnancy, unspecified, unspecified trimester: Secondary | ICD-10-CM

## 2019-12-30 DIAGNOSIS — Z794 Long term (current) use of insulin: Secondary | ICD-10-CM

## 2019-12-30 DIAGNOSIS — O24313 Unspecified pre-existing diabetes mellitus in pregnancy, third trimester: Secondary | ICD-10-CM | POA: Diagnosis not present

## 2019-12-30 DIAGNOSIS — O0993 Supervision of high risk pregnancy, unspecified, third trimester: Secondary | ICD-10-CM

## 2019-12-30 DIAGNOSIS — E118 Type 2 diabetes mellitus with unspecified complications: Secondary | ICD-10-CM

## 2019-12-30 DIAGNOSIS — R9439 Abnormal result of other cardiovascular function study: Secondary | ICD-10-CM

## 2019-12-30 DIAGNOSIS — O10913 Unspecified pre-existing hypertension complicating pregnancy, third trimester: Secondary | ICD-10-CM

## 2019-12-30 DIAGNOSIS — Z8759 Personal history of other complications of pregnancy, childbirth and the puerperium: Secondary | ICD-10-CM

## 2019-12-30 DIAGNOSIS — O24319 Unspecified pre-existing diabetes mellitus in pregnancy, unspecified trimester: Secondary | ICD-10-CM

## 2019-12-30 DIAGNOSIS — Z98891 History of uterine scar from previous surgery: Secondary | ICD-10-CM

## 2019-12-30 LAB — POCT URINALYSIS DIP (DEVICE)
Bilirubin Urine: NEGATIVE
Glucose, UA: NEGATIVE mg/dL
Hgb urine dipstick: NEGATIVE
Ketones, ur: NEGATIVE mg/dL
Leukocytes,Ua: NEGATIVE
Nitrite: NEGATIVE
Protein, ur: NEGATIVE mg/dL
Specific Gravity, Urine: 1.02 (ref 1.005–1.030)
Urobilinogen, UA: 0.2 mg/dL (ref 0.0–1.0)
pH: 7 (ref 5.0–8.0)

## 2019-12-30 MED ORDER — ACCU-CHEK SMARTVIEW VI STRP: ORAL_STRIP | 12 refills | Status: DC

## 2019-12-30 MED ORDER — INSULIN GLARGINE 100 UNITS/ML SOLOSTAR PEN: 30.0000 [IU] | PEN_INJECTOR | Freq: Every day | SUBCUTANEOUS | 3 refills | Status: DC

## 2019-12-30 MED ORDER — GLUCOSE BLOOD VI STRP: ORAL_STRIP | 12 refills | Status: DC

## 2019-12-30 MED ORDER — NIFEDIPINE ER OSMOTIC RELEASE 30 MG PO TB24: 30.0000 mg | ORAL_TABLET | Freq: Every day | ORAL | 1 refills | Status: DC

## 2019-12-30 MED ORDER — INSULIN GLARGINE 100 UNITS/ML SOLOSTAR PEN: 25.0000 [IU] | PEN_INJECTOR | Freq: Every day | SUBCUTANEOUS | 3 refills | Status: DC

## 2019-12-30 NOTE — Progress Notes (Signed)
Prenatal Visit Note Date: 12/30/2019 Clinic: [facility]  Subjective:  Joyce Green is a 34 y.o. [phone] at [redacted]w[redacted]d being seen today for ongoing prenatal care.  She is currently monitored for the following issues for this high-risk pregnancy and has Obesity (BMI 30-39.9); Obesity in pregnancy; DM (diabetes mellitus), type 2 (HCC); Benign essential HTN; Chronic hypertension affecting pregnancy; Supervision of high risk pregnancy, antepartum; Preexisting diabetes complicating pregnancy, antepartum; History of IUFD; History of VBAC; Abnormal cardiovascular stress test; and Medication exposure during first trimester of pregnancy on their problem list.  Patient reports occasional b/l LE edema, difficulty with sleep.   Contractions: Irritability. Vag. Bleeding: None.  Movement: Present. Denies leaking of fluid.   The following portions of the patient's history were reviewed and updated as appropriate: allergies, current medications, past family history, past medical history, past social history, past surgical history and problem list. Problem list updated.  Objective:   Vitals:   12/30/19 1024  BP: 133/84  Pulse: (!) 108  Weight: 258 lb 11.2 oz (117.3 kg)    Fetal Status: Fetal Heart Rate (bpm): 140s   Movement: Present     General:  Alert, oriented and cooperative. Patient is in no acute distress.  Skin: Skin is warm and dry. No rash noted.   Cardiovascular: Normal heart rate noted  Respiratory: Normal respiratory effort, no problems with respiration noted  Abdomen: Soft, gravid, appropriate for gestational age. Pain/Pressure: Present     Pelvic:  Cervical exam deferred        Extremities: Normal range of motion.  Edema: Trace b/l in LE  Mental Status: Normal mood and affect. Normal behavior. Normal judgment and thought content.   Urinalysis:      Assessment and Plan:  Pregnancy: [id number] at [redacted]w[redacted]d  1. Chronic hypertension affecting pregnancy Continue procardia  xl 30 qday. Confirms low dose ASA - NIFEdipine (PROCARDIA-XL/NIFEDICAL-XL) 30 MG 24 hr tablet; Take 1 tablet (30 mg total) by mouth daily. For HTN  Dispense: 60 tablet; Refill: 1  2. Type 2 diabetes mellitus with complication (HCC) On lantus 25 qhs and metformin 1000 w/ breakfast and 1000 qhs. AM fastings in the 80s-90s with 2h PP in the 120s-140s. Will increase lantus to 30. Pt up at 0300 and asked to check CBGs. If then and other times CBGs in the 60s-70s pt advised to decrease to 28 qhs. Starting qwk AP testing next week. Has rpt growth next week - glucose blood (ACCU-CHEK SMARTVIEW) test strip; Use as instructed to check blood sugars  Dispense: 120 each; Refill: 12  3. Supervision of high risk pregnancy, antepartum Routine care. D/w her re: BC nv - glucose blood test strip; Use as instructed  Dispense: 100 each; Refill: 12  4. Preexisting diabetes complicating pregnancy, antepartum - glucose blood test strip; Use as instructed  Dispense: 100 each; Refill: 12  5. History of VBAC D/w her re: delivery mode nv  6. History of IUFD At 33wks in 2020. See above. D/w mfm re: 37wk delivery this pregnancy  7. Abnormal cardiovascular stress test Nothing to do. See cards note 11/2019. Abnormal test likely artifact  8. Obesity in pregnancy  9. Obesity (BMI 30-39.9)  Preterm labor symptoms and general obstetric precautions including but not limited to vaginal bleeding, contractions, leaking of fluid and fetal movement were reviewed in detail with the patient. Please refer to After Visit Summary for other counseling recommendations.  Return in about 1 week (around 01/06/2020) for high risk, in person.   [place] Bing,  MD   

## 2020-01-05 ENCOUNTER — Other Ambulatory Visit: Payer: Self-pay | Admitting: *Deleted

## 2020-01-05 ENCOUNTER — Ambulatory Visit: Payer: Medicaid Other | Admitting: *Deleted

## 2020-01-05 ENCOUNTER — Other Ambulatory Visit: Payer: Self-pay

## 2020-01-05 ENCOUNTER — Ambulatory Visit: Payer: Medicaid Other | Attending: Obstetrics and Gynecology

## 2020-01-05 ENCOUNTER — Encounter: Payer: Self-pay | Admitting: Obstetrics and Gynecology

## 2020-01-05 ENCOUNTER — Ambulatory Visit (INDEPENDENT_AMBULATORY_CARE_PROVIDER_SITE_OTHER): Payer: Medicaid Other | Admitting: Obstetrics and Gynecology

## 2020-01-05 VITALS — BP 119/83 | HR 116 | Wt 261.1 lb

## 2020-01-05 DIAGNOSIS — Z8759 Personal history of other complications of pregnancy, childbirth and the puerperium: Secondary | ICD-10-CM

## 2020-01-05 DIAGNOSIS — O10919 Unspecified pre-existing hypertension complicating pregnancy, unspecified trimester: Secondary | ICD-10-CM

## 2020-01-05 DIAGNOSIS — O24319 Unspecified pre-existing diabetes mellitus in pregnancy, unspecified trimester: Secondary | ICD-10-CM

## 2020-01-05 DIAGNOSIS — O09293 Supervision of pregnancy with other poor reproductive or obstetric history, third trimester: Secondary | ICD-10-CM | POA: Insufficient documentation

## 2020-01-05 DIAGNOSIS — Z362 Encounter for other antenatal screening follow-up: Secondary | ICD-10-CM | POA: Diagnosis not present

## 2020-01-05 DIAGNOSIS — Z3A31 31 weeks gestation of pregnancy: Secondary | ICD-10-CM | POA: Diagnosis not present

## 2020-01-05 DIAGNOSIS — O24113 Pre-existing diabetes mellitus, type 2, in pregnancy, third trimester: Secondary | ICD-10-CM

## 2020-01-05 DIAGNOSIS — E119 Type 2 diabetes mellitus without complications: Secondary | ICD-10-CM

## 2020-01-05 DIAGNOSIS — Z794 Long term (current) use of insulin: Secondary | ICD-10-CM

## 2020-01-05 DIAGNOSIS — O10013 Pre-existing essential hypertension complicating pregnancy, third trimester: Secondary | ICD-10-CM

## 2020-01-05 DIAGNOSIS — O0990 Supervision of high risk pregnancy, unspecified, unspecified trimester: Secondary | ICD-10-CM | POA: Diagnosis present

## 2020-01-05 DIAGNOSIS — E669 Obesity, unspecified: Secondary | ICD-10-CM

## 2020-01-05 DIAGNOSIS — O34219 Maternal care for unspecified type scar from previous cesarean delivery: Secondary | ICD-10-CM

## 2020-01-05 DIAGNOSIS — O99213 Obesity complicating pregnancy, third trimester: Secondary | ICD-10-CM

## 2020-01-05 DIAGNOSIS — O0993 Supervision of high risk pregnancy, unspecified, third trimester: Secondary | ICD-10-CM

## 2020-01-05 DIAGNOSIS — O10913 Unspecified pre-existing hypertension complicating pregnancy, third trimester: Secondary | ICD-10-CM

## 2020-01-05 LAB — POCT URINALYSIS DIP (DEVICE)
Bilirubin Urine: NEGATIVE
Glucose, UA: NEGATIVE mg/dL
Hgb urine dipstick: NEGATIVE
Ketones, ur: NEGATIVE mg/dL
Leukocytes,Ua: NEGATIVE
Nitrite: NEGATIVE
Protein, ur: NEGATIVE mg/dL
Specific Gravity, Urine: 1.02 (ref 1.005–1.030)
Urobilinogen, UA: 0.2 mg/dL (ref 0.0–1.0)
pH: 5.5 (ref 5.0–8.0)

## 2020-01-05 NOTE — Progress Notes (Signed)
Pt scheduled for fetal echo 01/11/20 at 1345 with [facility].   Fleet Contras RN 01/05/20

## 2020-01-05 NOTE — Progress Notes (Signed)
   PRENATAL VISIT NOTE  Subjective:  Joyce Green is a 34 y.o. [id number] at [redacted]w[redacted]d being seen today for ongoing prenatal care.  She is currently monitored for the following issues for this high-risk pregnancy and has Obesity (BMI 30-39.9); Obesity in pregnancy; DM (diabetes mellitus), type 2 (HCC); Benign essential HTN; Chronic hypertension affecting pregnancy; Supervision of high risk pregnancy, antepartum; Preexisting diabetes complicating pregnancy, antepartum; History of IUFD; History of VBAC; Abnormal cardiovascular stress test; and Medication exposure during first trimester of pregnancy on their problem list.  Patient reports no complaints.  Contractions: Irritability. Vag. Bleeding: None.  Movement: Present. Denies leaking of fluid.   The following portions of the patient's history were reviewed and updated as appropriate: allergies, current medications, past family history, past medical history, past social history, past surgical history and problem list.   Objective:   Vitals:   01/05/20 1054  BP: 119/83  Pulse: (!) 116  Weight: (!) 261 lb 1.6 oz (118.4 kg)    Fetal Status: Fetal Heart Rate (bpm): 138 Fundal Height: 32 cm Movement: Present     General:  Alert, oriented and cooperative. Patient is in no acute distress.  Skin: Skin is warm and dry. No rash noted.   Cardiovascular: Normal heart rate noted  Respiratory: Normal respiratory effort, no problems with respiration noted  Abdomen: Soft, gravid, appropriate for gestational age.  Pain/Pressure: Absent     Pelvic: Cervical exam deferred        Extremities: Normal range of motion.  Edema: Trace  Mental Status: Normal mood and affect. Normal behavior. Normal judgment and thought content.   Assessment and Plan:  Pregnancy: [id number] at [redacted]w[redacted]d 1. Supervision of high risk pregnancy, antepartum Patient is doing well without complaints  2. Chronic hypertension affecting pregnancy Normotensive Continue procardia and ASA  3.  Preexisting diabetes complicating pregnancy, antepartum Patient reports fasting CBG 74-80 and pp 120-130 with most values in the 120's Will continue current lantus and metformin dose Will follow up on fetal echo scheduling  4. History of IUFD Continue weekly testing with MFM Patient will be scheduled for repeat c-section with BTL. Timing of delivery (37 vs 39 wks to be determined by MFM) Patient interested in TOLAC if spontaneous onset of labor  Preterm labor symptoms and general obstetric precautions including but not limited to vaginal bleeding, contractions, leaking of fluid and fetal movement were reviewed in detail with the patient. Please refer to After Visit Summary for other counseling recommendations.   Return in about 2 weeks (around 01/19/2020) for in person, ROB, High risk.  Future Appointments  Date Time Provider Department Center  01/05/2020 12:30 PM [facility] NURSE [facility]  01/05/2020 12:45 PM [facility] US4 [facility]  01/12/2020  1:30 PM [facility] NURSE [facility]  01/12/2020  1:45 PM [facility] US5 [facility]  01/13/2020  3:55 PM Aristes Bing, MD [facility]  01/27/2020  3:55 PM Delta Bing, MD [facility]  02/04/2020  3:55 PM Hermina Staggers, MD [facility]  02/11/2020  3:55 PM Hermina Staggers, MD [facility]  02/19/2020 10:55 AM Conan Bowens, MD [facility]  02/24/2020  3:55 PM  Bing, MD [facility]  03/02/2020  3:55 PM Hermina Staggers, MD [facility]    Catalina Antigua, MD

## 2020-01-07 ENCOUNTER — Encounter: Payer: Self-pay | Admitting: *Deleted

## 2020-01-08 ENCOUNTER — Telehealth: Payer: Self-pay | Admitting: *Deleted

## 2020-01-08 MED ORDER — ZOLPIDEM TARTRATE 5 MG PO TABS
5.0000 mg | ORAL_TABLET | Freq: Every evening | ORAL | 1 refills | Status: DC | PRN
Start: 2020-01-08 — End: 2020-02-14

## 2020-01-08 MED ORDER — ZOLPIDEM TARTRATE 5 MG PO TABS
5.0000 mg | ORAL_TABLET | Freq: Every evening | ORAL | 1 refills | Status: DC | PRN
Start: 2020-01-08 — End: 2020-01-08

## 2020-01-08 NOTE — Telephone Encounter (Signed)
Teneshia left a message yesterday afternoon stating she is a patient in our office and usually sees Dr. Vergie Living. States saw Dr. Jolayne Panther last. States Dr. Jolayne Panther said would send in RX for Ambien but [facility] said it is not there. Also states they told her that a new Lantus Pen Rx is needed from current provider because the one on file is for her last provider.  Per chart had Lantus order sent in for 30 units subcutaneous daily on 12/30/19. I called [facility] and they confirmed they do not have that order. I gave the order by phone and also that can by by Pen if covered by insurance as long as patient is taking correct insulin order.  Will forward to Dr. Jolayne Panther re: Ambien. Azilee also sent in a MyChart message re this . I will reply to that message that we have sent in Insulin/ pen order and forwarding to Dr. Jolayne Panther re: Remus Loffler. Tara Rud,RN

## 2020-01-12 ENCOUNTER — Ambulatory Visit: Payer: Medicaid Other | Attending: Obstetrics and Gynecology

## 2020-01-12 ENCOUNTER — Ambulatory Visit: Payer: Medicaid Other | Admitting: *Deleted

## 2020-01-12 ENCOUNTER — Other Ambulatory Visit: Payer: Self-pay

## 2020-01-12 DIAGNOSIS — Z3A32 32 weeks gestation of pregnancy: Secondary | ICD-10-CM

## 2020-01-12 DIAGNOSIS — O10013 Pre-existing essential hypertension complicating pregnancy, third trimester: Secondary | ICD-10-CM

## 2020-01-12 DIAGNOSIS — O99213 Obesity complicating pregnancy, third trimester: Secondary | ICD-10-CM | POA: Diagnosis not present

## 2020-01-12 DIAGNOSIS — Z8759 Personal history of other complications of pregnancy, childbirth and the puerperium: Secondary | ICD-10-CM | POA: Insufficient documentation

## 2020-01-12 DIAGNOSIS — E669 Obesity, unspecified: Secondary | ICD-10-CM

## 2020-01-12 DIAGNOSIS — O09293 Supervision of pregnancy with other poor reproductive or obstetric history, third trimester: Secondary | ICD-10-CM

## 2020-01-12 DIAGNOSIS — O0990 Supervision of high risk pregnancy, unspecified, unspecified trimester: Secondary | ICD-10-CM

## 2020-01-12 DIAGNOSIS — O24319 Unspecified pre-existing diabetes mellitus in pregnancy, unspecified trimester: Secondary | ICD-10-CM

## 2020-01-12 DIAGNOSIS — O24113 Pre-existing diabetes mellitus, type 2, in pregnancy, third trimester: Secondary | ICD-10-CM

## 2020-01-12 DIAGNOSIS — O24119 Pre-existing diabetes mellitus, type 2, in pregnancy, unspecified trimester: Secondary | ICD-10-CM | POA: Insufficient documentation

## 2020-01-12 DIAGNOSIS — O34219 Maternal care for unspecified type scar from previous cesarean delivery: Secondary | ICD-10-CM

## 2020-01-13 ENCOUNTER — Ambulatory Visit (INDEPENDENT_AMBULATORY_CARE_PROVIDER_SITE_OTHER): Payer: Medicaid Other | Admitting: Obstetrics and Gynecology

## 2020-01-13 VITALS — BP 136/83 | HR 112 | Wt 261.4 lb

## 2020-01-13 DIAGNOSIS — E669 Obesity, unspecified: Secondary | ICD-10-CM

## 2020-01-13 DIAGNOSIS — O24319 Unspecified pre-existing diabetes mellitus in pregnancy, unspecified trimester: Secondary | ICD-10-CM

## 2020-01-13 DIAGNOSIS — Z3A33 33 weeks gestation of pregnancy: Secondary | ICD-10-CM

## 2020-01-13 DIAGNOSIS — O10919 Unspecified pre-existing hypertension complicating pregnancy, unspecified trimester: Secondary | ICD-10-CM

## 2020-01-13 DIAGNOSIS — Z98891 History of uterine scar from previous surgery: Secondary | ICD-10-CM

## 2020-01-13 DIAGNOSIS — Z8759 Personal history of other complications of pregnancy, childbirth and the puerperium: Secondary | ICD-10-CM

## 2020-01-13 DIAGNOSIS — O99210 Obesity complicating pregnancy, unspecified trimester: Secondary | ICD-10-CM

## 2020-01-13 DIAGNOSIS — O0990 Supervision of high risk pregnancy, unspecified, unspecified trimester: Secondary | ICD-10-CM

## 2020-01-13 LAB — POCT URINALYSIS DIP (DEVICE)
Bilirubin Urine: NEGATIVE
Glucose, UA: NEGATIVE mg/dL
Hgb urine dipstick: NEGATIVE
Ketones, ur: NEGATIVE mg/dL
Leukocytes,Ua: NEGATIVE
Nitrite: NEGATIVE
Protein, ur: NEGATIVE mg/dL
Specific Gravity, Urine: >=1.03 (ref 1.005–1.030)
Urobilinogen, UA: 1 mg/dL (ref 0.0–1.0)
pH: 5.5 (ref 5.0–8.0)

## 2020-01-13 NOTE — Progress Notes (Signed)
BRx Glucose Readings:     

## 2020-01-13 NOTE — Progress Notes (Addendum)
Prenatal Visit Note Date: 01/13/2020 Clinic: [facility]  Subjective:  Joyce Green is a 34 y.o. [phone] at [redacted]w[redacted]d being seen today for ongoing prenatal care.  She is currently monitored for the following issues for this high-risk pregnancy and has Obesity (BMI 30-39.9); Obesity in pregnancy; DM (diabetes mellitus), type 2 (HCC); Benign essential HTN; Chronic hypertension affecting pregnancy; Supervision of high risk pregnancy, antepartum; Preexisting diabetes complicating pregnancy, antepartum; History of IUFD; History of VBAC; Abnormal cardiovascular stress test; and Medication exposure during first trimester of pregnancy on their problem list.  Patient reports no complaints.   Contractions: Irregular. Vag. Bleeding: None.  Movement: Present. Denies leaking of fluid.   The following portions of the patient's history were reviewed and updated as appropriate: allergies, current medications, past family history, past medical history, past social history, past surgical history and problem list. Problem list updated.  Objective:   Vitals:   01/13/20 1637  BP: 136/83  Pulse: (!) 112  Weight: 261 lb 6.4 oz (118.6 kg)    Fetal Status: Fetal Heart Rate (bpm): 160   Movement: Present     General:  Alert, oriented and cooperative. Patient is in no acute distress.  Skin: Skin is warm and dry. No rash noted.   Cardiovascular: Normal heart rate noted  Respiratory: Normal respiratory effort, no problems with respiration noted  Abdomen: Soft, gravid, appropriate for gestational age. Pain/Pressure: Absent     Pelvic:  Cervical exam deferred        Extremities: Normal range of motion.  Edema: Mild pitting, slight indentation  Mental Status: Normal mood and affect. Normal behavior. Normal judgment and thought content.   Urinalysis:      Assessment and Plan:  Pregnancy: [id number] at [redacted]w[redacted]d  1. Preexisting diabetes complicating pregnancy, antepartum On lantus 30 qhs and  metformin 1000 bid. I asked her to check more sugars. May need to do meal coverage at lunch and/or dinner; see RN note.   8/2 fetal echo normal. No need for rpts.   BPP reassuring yesterday. Continue weekly testing  2. Chronic hypertension affecting pregnancy Doing well on procardia xl 30 qday  3. History of IUFD See below  4. History of VBAC D/w her that would not recommend TOLAC if not spontaneous labor and she agrees with this. She desires c/s at this point. I d/w her that based on her history (DM2 on insulin, cHTN, h/o late term IUFD) that she has an indication for delivery at 37-39wks. I told her about risk with early term, such as NN sugar issues, feeding, temperature regulation, respiratory and likely need to stay in the NICU; I told her I'd recommend a 37-38wk delivery. Pt to consider and let us know for c-section scheduling purposes  5. [redacted] weeks gestation of pregnancy  6. Supervision of high risk pregnancy, antepartum Desire BTL. Papers signed 7/27. Patient had questions re: covid vaccination. Risks and benefits d/w her and re: acog recommendation saying it's okay for pregnant women to get it. Pt to consider options.   7. Obesity in pregnancy  8. Obesity (BMI 30-39.9)  Preterm labor symptoms and general obstetric precautions including but not limited to vaginal bleeding, contractions, leaking of fluid and fetal movement were reviewed in detail with the patient. Please refer to After Visit Summary for other counseling recommendations.  Return in about 1 week (around 01/20/2020) for [facility]ICE [facility] Visit.   Solana Bing, MD

## 2020-01-13 NOTE — Patient Instructions (Signed)
Return to office for any scheduled appointments. Call the office or go to the MAU at [facility] [place] if:  You begin to have strong, frequent contractions  Your water breaks.  Sometimes it is a big gush of fluid, sometimes it is just a trickle that keeps getting your panties wet or running down your legs  You have vaginal bleeding.  It is normal to have a small amount of spotting if your cervix was checked.   You do not feel your baby moving like normal.  If you do not, get something to eat and drink and lay down and focus on feeling your baby move.   If your baby is still not moving like normal, you should call the office or go to MAU.  Any other obstetric concerns.   

## 2020-01-13 NOTE — Progress Notes (Signed)
States last week had back to back contractions - but they stopped with complete rest.  Jacody Beneke,RN

## 2020-01-19 ENCOUNTER — Ambulatory Visit: Payer: Medicaid Other | Admitting: *Deleted

## 2020-01-19 ENCOUNTER — Encounter: Payer: Self-pay | Admitting: Family Medicine

## 2020-01-19 ENCOUNTER — Other Ambulatory Visit: Payer: Self-pay

## 2020-01-19 ENCOUNTER — Encounter: Payer: Self-pay | Admitting: *Deleted

## 2020-01-19 ENCOUNTER — Ambulatory Visit: Payer: Medicaid Other | Attending: Obstetrics and Gynecology

## 2020-01-19 DIAGNOSIS — O0990 Supervision of high risk pregnancy, unspecified, unspecified trimester: Secondary | ICD-10-CM | POA: Insufficient documentation

## 2020-01-19 DIAGNOSIS — O09293 Supervision of pregnancy with other poor reproductive or obstetric history, third trimester: Secondary | ICD-10-CM | POA: Diagnosis not present

## 2020-01-19 DIAGNOSIS — Z8759 Personal history of other complications of pregnancy, childbirth and the puerperium: Secondary | ICD-10-CM | POA: Insufficient documentation

## 2020-01-19 DIAGNOSIS — E669 Obesity, unspecified: Secondary | ICD-10-CM

## 2020-01-19 DIAGNOSIS — O10013 Pre-existing essential hypertension complicating pregnancy, third trimester: Secondary | ICD-10-CM | POA: Diagnosis not present

## 2020-01-19 DIAGNOSIS — O99213 Obesity complicating pregnancy, third trimester: Secondary | ICD-10-CM

## 2020-01-19 DIAGNOSIS — O24319 Unspecified pre-existing diabetes mellitus in pregnancy, unspecified trimester: Secondary | ICD-10-CM | POA: Diagnosis present

## 2020-01-19 DIAGNOSIS — O24113 Pre-existing diabetes mellitus, type 2, in pregnancy, third trimester: Secondary | ICD-10-CM | POA: Diagnosis not present

## 2020-01-19 DIAGNOSIS — Z3A33 33 weeks gestation of pregnancy: Secondary | ICD-10-CM

## 2020-01-19 DIAGNOSIS — O34219 Maternal care for unspecified type scar from previous cesarean delivery: Secondary | ICD-10-CM

## 2020-01-22 ENCOUNTER — Other Ambulatory Visit: Payer: Self-pay

## 2020-01-22 ENCOUNTER — Ambulatory Visit (INDEPENDENT_AMBULATORY_CARE_PROVIDER_SITE_OTHER): Payer: Medicaid Other | Admitting: Obstetrics and Gynecology

## 2020-01-22 VITALS — BP 135/87 | HR 118 | Wt 260.1 lb

## 2020-01-22 DIAGNOSIS — O99210 Obesity complicating pregnancy, unspecified trimester: Secondary | ICD-10-CM

## 2020-01-22 DIAGNOSIS — E669 Obesity, unspecified: Secondary | ICD-10-CM

## 2020-01-22 DIAGNOSIS — Z3A34 34 weeks gestation of pregnancy: Secondary | ICD-10-CM

## 2020-01-22 DIAGNOSIS — O0990 Supervision of high risk pregnancy, unspecified, unspecified trimester: Secondary | ICD-10-CM

## 2020-01-22 DIAGNOSIS — O24319 Unspecified pre-existing diabetes mellitus in pregnancy, unspecified trimester: Secondary | ICD-10-CM

## 2020-01-22 DIAGNOSIS — Z98891 History of uterine scar from previous surgery: Secondary | ICD-10-CM

## 2020-01-22 DIAGNOSIS — O409XX0 Polyhydramnios, unspecified trimester, not applicable or unspecified: Secondary | ICD-10-CM

## 2020-01-22 DIAGNOSIS — Z8759 Personal history of other complications of pregnancy, childbirth and the puerperium: Secondary | ICD-10-CM

## 2020-01-22 DIAGNOSIS — O10919 Unspecified pre-existing hypertension complicating pregnancy, unspecified trimester: Secondary | ICD-10-CM

## 2020-01-22 MED ORDER — INSULIN GLARGINE 100 UNITS/ML SOLOSTAR PEN: 30.0000 [IU] | PEN_INJECTOR | Freq: Every day | SUBCUTANEOUS | 0 refills | Status: DC

## 2020-01-22 MED ORDER — INSULIN ASPART (W/NIACINAMIDE) 100 UNIT/ML ~~LOC~~ SOPN: 3.0000 [IU] | PEN_INJECTOR | Freq: Every day | SUBCUTANEOUS | 0 refills | Status: DC

## 2020-01-22 NOTE — Progress Notes (Signed)
BRx Glucose readings:       

## 2020-01-22 NOTE — Progress Notes (Signed)
Prenatal Visit Note Date: 01/22/2020 Clinic: [facility]  Subjective:  Joyce Green is a 34 y.o. [phone] at [redacted]w[redacted]d being seen today for ongoing prenatal care.  She is currently monitored for the following issues for this high-risk pregnancy and has Obesity (BMI 30-39.9); Obesity in pregnancy; DM (diabetes mellitus), type 2 (HCC); Benign essential HTN; Chronic hypertension affecting pregnancy; Supervision of high risk pregnancy, antepartum; Preexisting diabetes complicating pregnancy, antepartum; History of IUFD; History of VBAC; Abnormal cardiovascular stress test; Medication exposure during first trimester of pregnancy; and Polyhydramnios affecting pregnancy on their problem list.  Patient reports no complaints.   Contractions: Irritability. Vag. Bleeding: None.  Movement: Present. Denies leaking of fluid.   The following portions of the patient's history were reviewed and updated as appropriate: allergies, current medications, past family history, past medical history, past social history, past surgical history and problem list. Problem list updated.  Objective:   Vitals:   01/22/20 1032  BP: 135/87  Pulse: (!) 118  Weight: 260 lb 1.6 oz (118 kg)    Fetal Status: Fetal Heart Rate (bpm): 137   Movement: Present     General:  Alert, oriented and cooperative. Patient is in no acute distress.  Skin: Skin is warm and dry. No rash noted.   Cardiovascular: Normal heart rate noted  Respiratory: Normal respiratory effort, no problems with respiration noted  Abdomen: Soft, gravid, appropriate for gestational age. Pain/Pressure: Present     Pelvic:  Cervical exam deferred        Extremities: Normal range of motion.  Edema: Trace  Mental Status: Normal mood and affect. Normal behavior. Normal judgment and thought content.   Urinalysis:      Assessment and Plan:  Pregnancy: [id number] at [redacted]w[redacted]d  1. Chronic hypertension affecting pregnancy Doing well on procardia xl 30  qday, lose dose asa  2. Preexisting diabetes complicating pregnancy, antepartum See RN note. Currently on lantus 30 qhs and metformin 1000 bid. D/w her I recommend short acting with dinner and she may need some with breakfast too but she's not checking sugars. Will start with aspart 3 units with dinner   Normal fetal echo on 01/11/20  3. Supervision of high risk pregnancy, antepartum Desires rpt c/s and btl for 37/0. Request sent for 9/1 bpp 8/8 with mild poly of 26.6 on 8/10. D/w her that this is likely from the diabetes and better cbg control can help with this.   GBS nv  4. Obesity in pregnancy  5. Obesity (BMI 30-39.9)  6. History of VBAC See above  7. History of IUFD  8. Polyhydramnios affecting pregnancy  Preterm labor symptoms and general obstetric precautions including but not limited to vaginal bleeding, contractions, leaking of fluid and fetal movement were reviewed in detail with the patient. Please refer to After Visit Summary for other counseling recommendations.  1wk HROB visit.    [place] Bing, MD

## 2020-01-26 ENCOUNTER — Other Ambulatory Visit: Payer: Self-pay | Admitting: Obstetrics and Gynecology

## 2020-01-27 ENCOUNTER — Ambulatory Visit (INDEPENDENT_AMBULATORY_CARE_PROVIDER_SITE_OTHER): Payer: Medicaid Other | Admitting: Obstetrics and Gynecology

## 2020-01-27 ENCOUNTER — Ambulatory Visit: Payer: Medicaid Other | Attending: Obstetrics and Gynecology

## 2020-01-27 ENCOUNTER — Other Ambulatory Visit: Payer: Self-pay

## 2020-01-27 ENCOUNTER — Ambulatory Visit: Payer: Medicaid Other | Admitting: *Deleted

## 2020-01-27 ENCOUNTER — Other Ambulatory Visit (HOSPITAL_COMMUNITY)
Admission: RE | Admit: 2020-01-27 | Discharge: 2020-01-27 | Disposition: A | Discharge status: Home / Self Care | Payer: Medicaid Other | Source: Ambulatory Visit | Attending: Obstetrics and Gynecology | Admitting: Obstetrics and Gynecology

## 2020-01-27 DIAGNOSIS — O24319 Unspecified pre-existing diabetes mellitus in pregnancy, unspecified trimester: Secondary | ICD-10-CM | POA: Insufficient documentation

## 2020-01-27 DIAGNOSIS — O34219 Maternal care for unspecified type scar from previous cesarean delivery: Secondary | ICD-10-CM | POA: Diagnosis not present

## 2020-01-27 DIAGNOSIS — O24119 Pre-existing diabetes mellitus, type 2, in pregnancy, unspecified trimester: Secondary | ICD-10-CM | POA: Diagnosis not present

## 2020-01-27 DIAGNOSIS — Z8759 Personal history of other complications of pregnancy, childbirth and the puerperium: Secondary | ICD-10-CM | POA: Diagnosis present

## 2020-01-27 DIAGNOSIS — E669 Obesity, unspecified: Secondary | ICD-10-CM

## 2020-01-27 DIAGNOSIS — O99213 Obesity complicating pregnancy, third trimester: Secondary | ICD-10-CM

## 2020-01-27 DIAGNOSIS — O0990 Supervision of high risk pregnancy, unspecified, unspecified trimester: Secondary | ICD-10-CM | POA: Diagnosis not present

## 2020-01-27 DIAGNOSIS — O09293 Supervision of pregnancy with other poor reproductive or obstetric history, third trimester: Secondary | ICD-10-CM

## 2020-01-27 DIAGNOSIS — O24113 Pre-existing diabetes mellitus, type 2, in pregnancy, third trimester: Secondary | ICD-10-CM | POA: Diagnosis not present

## 2020-01-27 DIAGNOSIS — O10013 Pre-existing essential hypertension complicating pregnancy, third trimester: Secondary | ICD-10-CM

## 2020-01-27 DIAGNOSIS — Z3A35 35 weeks gestation of pregnancy: Secondary | ICD-10-CM

## 2020-01-28 LAB — GC/CHLAMYDIA PROBE AMP (~~LOC~~) NOT AT ARMC
Chlamydia: NEGATIVE
Comment: NEGATIVE
Comment: NORMAL
Neisseria Gonorrhea: NEGATIVE

## 2020-01-28 NOTE — Progress Notes (Signed)
Prenatal Visit Note Date: 01/27/2020 Clinic: [facility]  Subjective:  Joyce Green is a 34 y.o. [phone] at [redacted]w[redacted]d being seen today for ongoing prenatal care.  She is currently monitored for the following issues for this high-risk pregnancy and has Obesity (BMI 30-39.9); Obesity in pregnancy; DM (diabetes mellitus), type 2 (HCC); Benign essential HTN; Chronic hypertension affecting pregnancy; Supervision of high risk pregnancy, antepartum; Preexisting diabetes complicating pregnancy, antepartum; History of IUFD; History of VBAC; Abnormal cardiovascular stress test; Medication exposure during first trimester of pregnancy; and Polyhydramnios affecting pregnancy on their problem list.  Patient reports no complaints.   Contractions: Irritability. Vag. Bleeding: None.  Movement: Present. Denies leaking of fluid.   The following portions of the patient's history were reviewed and updated as appropriate: allergies, current medications, past family history, past medical history, past social history, past surgical history and problem list. Problem list updated.  Objective:  There were no vitals filed for this visit. 125/89 HR 99  Fetal Status:     Movement: Present   Vaginal bleeding: None  General:  Alert, oriented and cooperative. Patient is in no acute distress.  Skin: Skin is warm and dry. No rash noted.   Cardiovascular: Normal heart rate noted  Respiratory: Normal respiratory effort, no problems with respiration noted  Abdomen: Soft, gravid, appropriate for gestational age. Pain/Pressure: Present     Pelvic:  Cervical exam deferred        Extremities: Normal range of motion.  Edema: Trace  Mental Status: Normal mood and affect. Normal behavior. Normal judgment and thought content.   Urinalysis:      Assessment and Plan:  Pregnancy: [id number] at [redacted]w[redacted]d  1. Chronic hypertension affecting pregnancy Doing well on procardia xl 30 qday, low dose asa  2. Preexisting  diabetes complicating pregnancy, antepartum See RN note. Currently on lantus 30 qhs and metformin 1000 bid. phamacy didn't have the aspart until later today so pt to pick up her aspart 3 units today. Normal CBGs except for after dinner in the 160s which the aspart should take care of.   Normal fetal echo on 01/11/20  3. Supervision of high risk pregnancy, antepartum Rpt and btl scheduled for 9/2. GBS today bpp 8/8 and poly improved to 24.1 today.   4. Obesity in pregnancy  5. Obesity (BMI 30-39.9)  6. History of VBAC See above  7. History of IUFD   Preterm labor symptoms and general obstetric precautions including but not limited to vaginal bleeding, contractions, leaking of fluid and fetal movement were reviewed in detail with the patient. Please refer to After Visit Summary for other counseling recommendations.  RTC: 1wk, hrob, bpp   [place] Bing, MD

## 2020-01-30 LAB — CULTURE, BETA STREP (GROUP B ONLY): Strep Gp B Culture: POSITIVE — AB

## 2020-02-01 ENCOUNTER — Encounter: Payer: Self-pay | Admitting: Obstetrics and Gynecology

## 2020-02-04 ENCOUNTER — Ambulatory Visit: Payer: Medicaid Other | Admitting: *Deleted

## 2020-02-04 ENCOUNTER — Other Ambulatory Visit: Payer: Self-pay

## 2020-02-04 ENCOUNTER — Encounter: Payer: Self-pay | Admitting: *Deleted

## 2020-02-04 ENCOUNTER — Encounter: Payer: Self-pay | Admitting: Obstetrics and Gynecology

## 2020-02-04 ENCOUNTER — Ambulatory Visit (INDEPENDENT_AMBULATORY_CARE_PROVIDER_SITE_OTHER): Payer: Medicaid Other | Admitting: Obstetrics and Gynecology

## 2020-02-04 ENCOUNTER — Ambulatory Visit: Payer: Medicaid Other | Attending: Obstetrics and Gynecology

## 2020-02-04 VITALS — BP 129/89 | HR 111 | Wt 264.8 lb

## 2020-02-04 DIAGNOSIS — O24319 Unspecified pre-existing diabetes mellitus in pregnancy, unspecified trimester: Secondary | ICD-10-CM | POA: Diagnosis present

## 2020-02-04 DIAGNOSIS — O24113 Pre-existing diabetes mellitus, type 2, in pregnancy, third trimester: Secondary | ICD-10-CM

## 2020-02-04 DIAGNOSIS — O09293 Supervision of pregnancy with other poor reproductive or obstetric history, third trimester: Secondary | ICD-10-CM | POA: Diagnosis not present

## 2020-02-04 DIAGNOSIS — O10919 Unspecified pre-existing hypertension complicating pregnancy, unspecified trimester: Secondary | ICD-10-CM

## 2020-02-04 DIAGNOSIS — O409XX0 Polyhydramnios, unspecified trimester, not applicable or unspecified: Secondary | ICD-10-CM

## 2020-02-04 DIAGNOSIS — O99213 Obesity complicating pregnancy, third trimester: Secondary | ICD-10-CM

## 2020-02-04 DIAGNOSIS — Z98891 History of uterine scar from previous surgery: Secondary | ICD-10-CM

## 2020-02-04 DIAGNOSIS — O99820 Streptococcus B carrier state complicating pregnancy: Secondary | ICD-10-CM

## 2020-02-04 DIAGNOSIS — O0990 Supervision of high risk pregnancy, unspecified, unspecified trimester: Secondary | ICD-10-CM

## 2020-02-04 DIAGNOSIS — O34219 Maternal care for unspecified type scar from previous cesarean delivery: Secondary | ICD-10-CM | POA: Diagnosis not present

## 2020-02-04 DIAGNOSIS — Z8759 Personal history of other complications of pregnancy, childbirth and the puerperium: Secondary | ICD-10-CM | POA: Diagnosis not present

## 2020-02-04 DIAGNOSIS — O10013 Pre-existing essential hypertension complicating pregnancy, third trimester: Secondary | ICD-10-CM

## 2020-02-04 DIAGNOSIS — Z3A36 36 weeks gestation of pregnancy: Secondary | ICD-10-CM

## 2020-02-04 DIAGNOSIS — Z3009 Encounter for other general counseling and advice on contraception: Secondary | ICD-10-CM

## 2020-02-04 DIAGNOSIS — E669 Obesity, unspecified: Secondary | ICD-10-CM

## 2020-02-04 LAB — POCT URINALYSIS DIP (DEVICE)
Glucose, UA: NEGATIVE mg/dL
Hgb urine dipstick: NEGATIVE
Ketones, ur: 40 mg/dL — AB
Leukocytes,Ua: NEGATIVE
Nitrite: NEGATIVE
Protein, ur: NEGATIVE mg/dL
Specific Gravity, Urine: >=1.03 (ref 1.005–1.030)
Urobilinogen, UA: 1 mg/dL (ref 0.0–1.0)
pH: 5.5 (ref 5.0–8.0)

## 2020-02-04 NOTE — Progress Notes (Signed)
Subjective:  Joyce Green is a 34 y.o. [phone] at [redacted]w[redacted]d being seen today for ongoing prenatal care.  She is currently monitored for the following issues for this high-risk pregnancy and has Obesity (BMI 30-39.9); GBS (group B Streptococcus carrier), +RV culture, currently pregnant; Obesity in pregnancy; DM (diabetes mellitus), type 2 (HCC); Benign essential HTN; Chronic hypertension affecting pregnancy; Supervision of high risk pregnancy, antepartum; Preexisting diabetes complicating pregnancy, antepartum; History of IUFD; History of VBAC; Abnormal cardiovascular stress test; Medication exposure during first trimester of pregnancy; Polyhydramnios affecting pregnancy; and Unwanted fertility on their problem list.  Patient reports general discomforts of pregnancy.  Contractions: Not present. Vag. Bleeding: None.  Movement: Present. Denies leaking of fluid.   The following portions of the patient's history were reviewed and updated as appropriate: allergies, current medications, past family history, past medical history, past social history, past surgical history and problem list. Problem list updated.  Objective:   Vitals:   02/04/20 1520  BP: 129/89  Pulse: (!) 111  Weight: 120.1 kg    Fetal Status: Fetal Heart Rate (bpm): 150   Movement: Present     General:  Alert, oriented and cooperative. Patient is in no acute distress.  Skin: Skin is warm and dry. No rash noted.   Cardiovascular: Normal heart rate noted  Respiratory: Normal respiratory effort, no problems with respiration noted  Abdomen: Soft, gravid, appropriate for gestational age. Pain/Pressure: Absent     Pelvic:  Cervical exam deferred        Extremities: Normal range of motion.  Edema: Trace  Mental Status: Normal mood and affect. Normal behavior. Normal judgment and thought content.   Urinalysis:      Assessment and Plan:  Pregnancy: [id number] at [redacted]w[redacted]d  1. Supervision of high risk pregnancy, antepartum Stable C section  next week  2. Chronic hypertension affecting pregnancy BP stable Continue with Procardia and BASA Antenatal testing today  3. Preexisting diabetes complicating pregnancy, antepartum CBG's in goal range Pt has not been taking aspar insulin at dinner d/t not covered  However 2 hr PP CBG's are in goal, will therefore not start Antenatal testing today   4. Polyhydramnios affecting pregnancy Antenatal testing today  5. History of VBAC For c section BTL next week  6. History of IUFD Antenatal testing today  7. GBS (group B Streptococcus carrier), +RV culture, currently pregnant Antibiotic during c section  8. Unwanted fertility BTL   Term labor symptoms and general obstetric precautions including but not limited to vaginal bleeding, contractions, leaking of fluid and fetal movement were reviewed in detail with the patient. Please refer to After Visit Summary for other counseling recommendations.  Return today (on 02/04/2020).   Hermina Staggers, MD

## 2020-02-04 NOTE — Patient Instructions (Signed)
Cesarean Delivery, Care After This sheet gives you information about how to care for yourself after your procedure. Your health care provider may also give you more specific instructions. If you have problems or questions, contact your health care provider. What can I expect after the procedure? After the procedure, it is common to have:  A small amount of blood or clear fluid coming from the incision.  Some redness, swelling, and pain in your incision area.  Some abdominal pain and soreness.  Vaginal bleeding (lochia). Even though you did not have a vaginal delivery, you will still have vaginal bleeding and discharge.  Pelvic cramps.  Fatigue. You may have pain, swelling, and discomfort in the tissue between your vagina and your anus (perineum) if:  Your C-section was unplanned, and you were allowed to labor and push.  An incision was made in the area (episiotomy) or the tissue tore during attempted vaginal delivery. Follow these instructions at home: Incision care   Follow instructions from your health care provider about how to take care of your incision. Make sure you: ? Wash your hands with soap and water before you change your bandage (dressing). If soap and water are not available, use hand sanitizer. ? If you have a dressing, change it or remove it as told by your health care provider. ? Leave stitches (sutures), skin staples, skin glue, or adhesive strips in place. These skin closures may need to stay in place for 2 weeks or longer. If adhesive strip edges start to loosen and curl up, you may trim the loose edges. Do not remove adhesive strips completely unless your health care provider tells you to do that.  Check your incision area every day for signs of infection. Check for: ? More redness, swelling, or pain. ? More fluid or blood. ? Warmth. ? Pus or a bad smell.  Do not take baths, swim, or use a hot tub until your health care provider says it's okay. Ask your health  care provider if you can take showers.  When you cough or sneeze, hug a pillow. This helps with pain and decreases the chance of your incision opening up (dehiscing). Do this until your incision heals. Medicines  Take over-the-counter and prescription medicines only as told by your health care provider.  If you were prescribed an antibiotic medicine, take it as told by your health care provider. Do not stop taking the antibiotic even if you start to feel better.  Do not drive or use heavy machinery while taking prescription pain medicine. Lifestyle  Do not drink alcohol. This is especially important if you are breastfeeding or taking pain medicine.  Do not use any products that contain nicotine or tobacco, such as cigarettes, e-cigarettes, and chewing tobacco. If you need help quitting, ask your health care provider. Eating and drinking  Drink at least 8 eight-ounce glasses of water every day unless told not to by your health care provider. If you breastfeed, you may need to drink even more water.  Eat high-fiber foods every day. These foods may help prevent or relieve constipation. High-fiber foods include: ? Whole grain cereals and breads. ? Brown rice. ? Beans. ? Fresh fruits and vegetables. Activity   If possible, have someone help you care for your baby and help with household activities for at least a few days after you leave the hospital.  Return to your normal activities as told by your health care provider. Ask your health care provider what activities are safe for   you.  Rest as much as possible. Try to rest or take a nap while your baby is sleeping.  Do not lift anything that is heavier than 10 lbs (4.5 kg), or the limit that you were told, until your health care provider says that it is safe.  Talk with your health care provider about when you can engage in sexual activity. This may depend on your: ? Risk of infection. ? How fast you heal. ? Comfort and desire to  engage in sexual activity. General instructions  Do not use tampons or douches until your health care provider approves.  Wear loose, comfortable clothing and a supportive and well-fitting bra.  Keep your perineum clean and dry. Wipe from front to back when you use the toilet.  If you pass a blood clot, save it and call your health care provider to discuss. Do not flush blood clots down the toilet before you get instructions from your health care provider.  Keep all follow-up visits for you and your baby as told by your health care provider. This is important. Contact a health care provider if:  You have: ? A fever. ? Bad-smelling vaginal discharge. ? Pus or a bad smell coming from your incision. ? Difficulty or pain when urinating. ? A sudden increase or decrease in the frequency of your bowel movements. ? More redness, swelling, or pain around your incision. ? More fluid or blood coming from your incision. ? A rash. ? Nausea. ? Little or no interest in activities you used to enjoy. ? Questions about caring for yourself or your baby.  Your incision feels warm to the touch.  Your breasts turn red or become painful or hard.  You feel unusually sad or worried.  You vomit.  You pass a blood clot from your vagina.  You urinate more than usual.  You are dizzy or light-headed. Get help right away if:  You have: ? Pain that does not go away or get better with medicine. ? Chest pain. ? Difficulty breathing. ? Blurred vision or spots in your vision. ? Thoughts about hurting yourself or your baby. ? New pain in your abdomen or in one of your legs. ? A severe headache.  You faint.  You bleed from your vagina so much that you fill more than one sanitary pad in one hour. Bleeding should not be heavier than your heaviest period. Summary  After the procedure, it is common to have pain at your incision site, abdominal cramping, and slight bleeding from your vagina.  Check  your incision area every day for signs of infection.  Tell your health care provider about any unusual symptoms.  Keep all follow-up visits for you and your baby as told by your health care provider. This information is not intended to replace advice given to you by your health care provider. Make sure you discuss any questions you have with your health care provider. Document Revised: 12/04/2017 Document Reviewed: 12/04/2017 Elsevier Patient Education  [employer]. Cesarean Delivery Cesarean birth, or cesarean delivery, is the surgical delivery of a baby through an incision in the abdomen and the uterus. This may be referred to as a C-section. This procedure may be scheduled ahead of time, or it may be done in an emergency situation. Tell a health care provider about:  Any allergies you have.  All medicines you are taking, including vitamins, herbs, eye drops, creams, and over-the-counter medicines.  Any problems you or family members have had with anesthetic  medicines.  Any blood disorders you have.  Any surgeries you have had.  Any medical conditions you have.  Whether you or any members of your family have a history of deep vein thrombosis (DVT) or pulmonary embolism (PE). What are the risks? Generally, this is a safe procedure. However, problems may occur, including:  Infection.  Bleeding.  Allergic reactions to medicines.  Damage to other structures or organs.  Blood clots.  Injury to your baby. What happens before the procedure? General instructions  Follow instructions from your health care provider about eating or drinking restrictions.  If you know that you are going to have a cesarean delivery, do not shave your pubic area. Shaving before the procedure may increase your risk of infection.  Plan to have someone take you home from the hospital.  Ask your health care provider what steps will be taken to prevent infection. These may include: ? Removing  hair at the surgery site. ? Washing skin with a germ-killing soap. ? Taking antibiotic medicine.  Depending on the reason for your cesarean delivery, you may have a physical exam or additional testing, such as an ultrasound.  You may have your blood or urine tested. Questions for your health care provider  Ask your health care provider about: ? Changing or stopping your regular medicines. This is especially important if you are taking diabetes medicines or blood thinners. ? Your pain management plan. This is especially important if you plan to breastfeed your baby. ? How long you will be in the hospital after the procedure. ? Any concerns you may have about receiving blood products, if you need them during the procedure. ? Cord blood banking, if you plan to collect your baby's umbilical cord blood.  You may also want to ask your health care provider: ? Whether you will be able to hold or breastfeed your baby while you are still in the operating room. ? Whether your baby can stay with you immediately after the procedure and during your recovery. ? Whether a family member or a person of your choice can go with you into the operating room and stay with you during the procedure, immediately after the procedure, and during your recovery. What happens during the procedure?   An IV will be inserted into one of your veins.  Fluid and medicines, such as antibiotics, will be given before the surgery.  Fetal monitors will be placed on your abdomen to check your baby's heart rate.  You may be given a special warming gown to wear to keep your temperature stable.  A catheter may be inserted into your bladder through your urethra. This drains your urine during the procedure.  You may be given one or more of the following: ? A medicine to numb the area (local anesthetic). ? A medicine to make you fall asleep (general anesthetic). ? A medicine (regional anesthetic) that is injected into your back  or through a small thin tube placed in your back (spinal anesthetic or epidural anesthetic). This numbs everything below the injection site and allows you to stay awake during your procedure. If this makes you feel nauseous, tell your health care provider. Medicines will be available to help reduce any nausea you may feel.  An incision will be made in your abdomen, and then in your uterus.  If you are awake during your procedure, you may feel tugging and pulling in your abdomen, but you should not feel pain. If you feel pain, tell your health care  provider immediately.  Your baby will be removed from your uterus. You may feel more pressure or pushing while this happens.  Immediately after birth, your baby will be dried and kept warm. You may be able to hold and breastfeed your baby.  The umbilical cord may be clamped and cut during this time. This usually occurs after waiting a period of 1-2 minutes after delivery.  Your placenta will be removed from your uterus.  Your incisions will be closed with stitches (sutures). Staples, skin glue, or adhesive strips may also be applied to the incision in your abdomen.  Bandages (dressings) may be placed over the incision in your abdomen. The procedure may vary among health care providers and hospitals. What happens after the procedure?  Your blood pressure, heart rate, breathing rate, and blood oxygen level will be monitored until you are discharged from the hospital.  You may continue to receive fluids and medicines through an IV.  You will have some pain. Medicines will be available to help control your pain.  To help prevent blood clots: ? You may be given medicines. ? You may have to wear compression stockings or devices. ? You will be encouraged to walk around when you are able.  Hospital staff will encourage and support bonding with your baby. Your hospital may have you and your baby to stay in the same room (rooming in) during your  hospital stay to encourage successful bonding and breastfeeding.  You may be encouraged to cough and breathe deeply often. This helps to prevent lung problems.  If you have a catheter draining your urine, it will be removed as soon as possible after your procedure. Summary  Cesarean birth, or cesarean delivery, is the surgical delivery of a baby through an incision in the abdomen and the uterus.  Follow instructions from your health care provider about eating or drinking restrictions before the procedure.  You will have some pain after the procedure. Medicines will be available to help control your pain.  Hospital staff will encourage and support bonding with your baby after the procedure. Your hospital may have you and your baby to stay in the same room (rooming in) during your hospital stay to encourage successful bonding and breastfeeding. This information is not intended to replace advice given to you by your health care provider. Make sure you discuss any questions you have with your health care provider. Document Revised: 12/02/2017 Document Reviewed: 12/02/2017 Elsevier Patient Education  2020 [employer].

## 2020-02-04 NOTE — Progress Notes (Signed)
Patient reports having issues with getting Fiaps insulin from [facility] due to insurance coverage. Contacted [facility], spoke with Corrie Dandy and was informed that patient could have Humalog or Novalog with a providers prescription as a substitute for short acting insulin Fiasp.    Mercy Moore, RN

## 2020-02-05 ENCOUNTER — Encounter (HOSPITAL_COMMUNITY): Payer: Self-pay

## 2020-02-05 NOTE — Patient Instructions (Signed)
Joyce Green  02/05/2020   Your procedure is scheduled on:  9.2.2021  Arrive at 0730 at Entrance C on [address] at [facility]  and [employer]. You are invited to use the FREE valet parking or use the Visitor's parking deck.  Pick up the phone at the desk and dial [phone].  Call this number if you have problems the morning of surgery: [phone]  Remember:   Do not eat food:(After Midnight) Desps de medianoche.  Do not drink clear liquids: (After Midnight) Desps de medianoche.  Take these medicines the morning of surgery with A SIP OF WATER:  Take 15 units of lantus insulin the night before surgery.  Do not take metformin the night before or the day of surgery.  Take Nifedipine as prescribed.   Do not wear jewelry, make-up or nail polish.  Do not wear lotions, powders, or perfumes. Do not wear deodorant.  Do not shave 48 hours prior to surgery.  Do not bring valuables to the hospital.  [facility] is not   responsible for any belongings or valuables brought to the hospital.  Contacts, dentures or bridgework may not be worn into surgery.  Leave suitcase in the car. After surgery it may be brought to your room.  For patients admitted to the hospital, checkout time is 11:00 AM the day of              discharge.      Please read over the following fact sheets that you were given:     Preparing for Surgery

## 2020-02-09 ENCOUNTER — Other Ambulatory Visit (HOSPITAL_COMMUNITY)
Admission: RE | Admit: 2020-02-09 | Discharge: 2020-02-09 | Disposition: A | Discharge status: Home / Self Care | Payer: Medicaid Other | Source: Ambulatory Visit | Attending: Obstetrics and Gynecology | Admitting: Obstetrics and Gynecology

## 2020-02-09 ENCOUNTER — Other Ambulatory Visit: Payer: Self-pay

## 2020-02-09 DIAGNOSIS — Z20822 Contact with and (suspected) exposure to covid-19: Secondary | ICD-10-CM | POA: Insufficient documentation

## 2020-02-09 HISTORY — DX: Other complications of anesthesia, initial encounter: T88.59XA

## 2020-02-09 LAB — CBC
HCT: 31.8 % — ABNORMAL LOW (ref 36.0–46.0)
Hemoglobin: 9.9 g/dL — ABNORMAL LOW (ref 12.0–15.0)
MCH: 24.5 pg — ABNORMAL LOW (ref 26.0–34.0)
MCHC: 31.1 g/dL (ref 30.0–36.0)
MCV: 78.7 fL — ABNORMAL LOW (ref 80.0–100.0)
Platelets: 204 10*3/uL (ref 150–400)
RBC: 4.04 MIL/uL (ref 3.87–5.11)
RDW: 15 % (ref 11.5–15.5)
WBC: 6.7 10*3/uL (ref 4.0–10.5)
nRBC: 0 % (ref 0.0–0.2)

## 2020-02-09 LAB — TYPE AND SCREEN
ABO/RH(D): O POS
Antibody Screen: NEGATIVE

## 2020-02-09 LAB — SARS CORONAVIRUS 2 (TAT 6-24 HRS): SARS Coronavirus 2: NEGATIVE

## 2020-02-09 LAB — RPR: RPR Ser Ql: NONREACTIVE

## 2020-02-09 NOTE — MAU Note (Signed)
Asymptomatic, swab collected. Waiting on lab 

## 2020-02-11 ENCOUNTER — Encounter: Payer: Self-pay | Admitting: Obstetrics and Gynecology

## 2020-02-11 ENCOUNTER — Inpatient Hospital Stay (HOSPITAL_COMMUNITY): Payer: Medicaid Other | Admitting: Anesthesiology

## 2020-02-11 ENCOUNTER — Encounter (HOSPITAL_COMMUNITY): Payer: Self-pay | Admitting: Obstetrics and Gynecology

## 2020-02-11 ENCOUNTER — Other Ambulatory Visit: Payer: Self-pay

## 2020-02-11 ENCOUNTER — Ambulatory Visit: Payer: Self-pay

## 2020-02-11 ENCOUNTER — Encounter (HOSPITAL_COMMUNITY)
Admission: RE | Disposition: A | Discharge status: Home / Self Care | Payer: Self-pay | Source: Home / Self Care | Attending: Obstetrics and Gynecology

## 2020-02-11 ENCOUNTER — Inpatient Hospital Stay (HOSPITAL_COMMUNITY)
Admission: RE | Admit: 2020-02-11 | Discharge: 2020-02-14 | DRG: 783 | Disposition: A | Discharge status: Home / Self Care | Payer: Medicaid Other | Attending: Obstetrics and Gynecology | Admitting: Obstetrics and Gynecology

## 2020-02-11 DIAGNOSIS — O2412 Pre-existing diabetes mellitus, type 2, in childbirth: Secondary | ICD-10-CM | POA: Diagnosis present

## 2020-02-11 DIAGNOSIS — Z87891 Personal history of nicotine dependence: Secondary | ICD-10-CM | POA: Diagnosis not present

## 2020-02-11 DIAGNOSIS — O2686 Pruritic urticarial papules and plaques of pregnancy (PUPPP): Secondary | ICD-10-CM | POA: Diagnosis present

## 2020-02-11 DIAGNOSIS — O24319 Unspecified pre-existing diabetes mellitus in pregnancy, unspecified trimester: Secondary | ICD-10-CM | POA: Diagnosis present

## 2020-02-11 DIAGNOSIS — O0990 Supervision of high risk pregnancy, unspecified, unspecified trimester: Secondary | ICD-10-CM

## 2020-02-11 DIAGNOSIS — O99214 Obesity complicating childbirth: Secondary | ICD-10-CM | POA: Diagnosis present

## 2020-02-11 DIAGNOSIS — O99824 Streptococcus B carrier state complicating childbirth: Secondary | ICD-10-CM | POA: Diagnosis present

## 2020-02-11 DIAGNOSIS — O34211 Maternal care for low transverse scar from previous cesarean delivery: Principal | ICD-10-CM | POA: Diagnosis present

## 2020-02-11 DIAGNOSIS — O403XX0 Polyhydramnios, third trimester, not applicable or unspecified: Secondary | ICD-10-CM | POA: Diagnosis present

## 2020-02-11 DIAGNOSIS — O1002 Pre-existing essential hypertension complicating childbirth: Secondary | ICD-10-CM | POA: Diagnosis present

## 2020-02-11 DIAGNOSIS — Z302 Encounter for sterilization: Secondary | ICD-10-CM | POA: Diagnosis not present

## 2020-02-11 DIAGNOSIS — O409XX0 Polyhydramnios, unspecified trimester, not applicable or unspecified: Secondary | ICD-10-CM | POA: Diagnosis present

## 2020-02-11 DIAGNOSIS — Z794 Long term (current) use of insulin: Secondary | ICD-10-CM | POA: Diagnosis not present

## 2020-02-11 DIAGNOSIS — Z3009 Encounter for other general counseling and advice on contraception: Secondary | ICD-10-CM | POA: Diagnosis present

## 2020-02-11 DIAGNOSIS — O99820 Streptococcus B carrier state complicating pregnancy: Secondary | ICD-10-CM

## 2020-02-11 DIAGNOSIS — E119 Type 2 diabetes mellitus without complications: Secondary | ICD-10-CM | POA: Diagnosis present

## 2020-02-11 DIAGNOSIS — Z3A37 37 weeks gestation of pregnancy: Secondary | ICD-10-CM

## 2020-02-11 DIAGNOSIS — Z9851 Tubal ligation status: Secondary | ICD-10-CM

## 2020-02-11 DIAGNOSIS — O1092 Unspecified pre-existing hypertension complicating childbirth: Secondary | ICD-10-CM

## 2020-02-11 DIAGNOSIS — O24425 Gestational diabetes mellitus in childbirth, controlled by oral hypoglycemic drugs: Secondary | ICD-10-CM

## 2020-02-11 DIAGNOSIS — Z6834 Body mass index (BMI) 34.0-34.9, adult: Secondary | ICD-10-CM | POA: Diagnosis present

## 2020-02-11 DIAGNOSIS — Z98891 History of uterine scar from previous surgery: Secondary | ICD-10-CM

## 2020-02-11 DIAGNOSIS — O119 Pre-existing hypertension with pre-eclampsia, unspecified trimester: Secondary | ICD-10-CM | POA: Diagnosis present

## 2020-02-11 DIAGNOSIS — Z8759 Personal history of other complications of pregnancy, childbirth and the puerperium: Secondary | ICD-10-CM

## 2020-02-11 DIAGNOSIS — E1165 Type 2 diabetes mellitus with hyperglycemia: Secondary | ICD-10-CM

## 2020-02-11 LAB — CBC
HCT: 34.5 % — ABNORMAL LOW (ref 36.0–46.0)
Hemoglobin: 10.5 g/dL — ABNORMAL LOW (ref 12.0–15.0)
MCH: 24.1 pg — ABNORMAL LOW (ref 26.0–34.0)
MCHC: 30.4 g/dL (ref 30.0–36.0)
MCV: 79.1 fL — ABNORMAL LOW (ref 80.0–100.0)
Platelets: 194 10*3/uL (ref 150–400)
RBC: 4.36 MIL/uL (ref 3.87–5.11)
RDW: 15.3 % (ref 11.5–15.5)
WBC: 9.8 10*3/uL (ref 4.0–10.5)
nRBC: 0 % (ref 0.0–0.2)

## 2020-02-11 LAB — GLUCOSE, CAPILLARY
Glucose-Capillary: 92 mg/dL (ref 70–99)
Glucose-Capillary: 99 mg/dL (ref 70–99)
Glucose-Capillary: 109 mg/dL — ABNORMAL HIGH (ref 70–99)

## 2020-02-11 LAB — CREATININE, SERUM
Creatinine, Ser: 0.65 mg/dL (ref 0.44–1.00)
GFR calc Af Amer: >60 mL/min (ref >60)
GFR calc non Af Amer: >60 mL/min (ref >60)

## 2020-02-11 SURGERY — Surgical Case
Anesthesia: Spinal | Laterality: Bilateral

## 2020-02-11 MED ORDER — LACTATED RINGERS IV SOLN: INTRAVENOUS | Status: DC | PRN

## 2020-02-11 MED ORDER — OXYCODONE HCL 5 MG PO TABS: 5.0000 mg | ORAL_TABLET | Freq: Once | ORAL | Status: DC | PRN

## 2020-02-11 MED ORDER — PHENYLEPHRINE HCL (PRESSORS) 10 MG/ML IV SOLN
INTRAVENOUS | Status: DC | PRN
  Administered 2020-02-11: 80 ug via INTRAVENOUS
  Administered 2020-02-11 (×3): 120 ug via INTRAVENOUS
  Administered 2020-02-11: 80 ug via INTRAVENOUS
  Administered 2020-02-11: 120 ug via INTRAVENOUS
  Administered 2020-02-11: 150 ug via INTRAVENOUS
  Administered 2020-02-11: 800 ug via INTRAVENOUS

## 2020-02-11 MED ORDER — ACETAMINOPHEN 500 MG PO TABS
1000.0000 mg | ORAL_TABLET | Freq: Four times a day (QID) | ORAL | Status: DC
  Administered 2020-02-11 – 2020-02-14 (×11): 1000 mg via ORAL
  Filled 2020-02-11 (×12): qty 2

## 2020-02-11 MED ORDER — STERILE WATER FOR IRRIGATION IR SOLN
Status: DC | PRN
  Administered 2020-02-11: 1

## 2020-02-11 MED ORDER — ENOXAPARIN SODIUM 60 MG/0.6ML ~~LOC~~ SOLN
0.5000 mg/kg | SUBCUTANEOUS | Status: DC
  Administered 2020-02-12 – 2020-02-14 (×3): 60 mg via SUBCUTANEOUS
  Filled 2020-02-11 (×3): qty 0.6

## 2020-02-11 MED ORDER — SIMETHICONE 80 MG PO CHEW
80.0000 mg | CHEWABLE_TABLET | Freq: Three times a day (TID) | ORAL | Status: DC
  Administered 2020-02-11 – 2020-02-14 (×11): 80 mg via ORAL
  Filled 2020-02-11 (×10): qty 1

## 2020-02-11 MED ORDER — METFORMIN HCL 500 MG PO TABS
1000.0000 mg | ORAL_TABLET | Freq: Two times a day (BID) | ORAL | Status: DC
  Administered 2020-02-11 – 2020-02-14 (×6): 1000 mg via ORAL
  Filled 2020-02-11 (×6): qty 2

## 2020-02-11 MED ORDER — SENNOSIDES-DOCUSATE SODIUM 8.6-50 MG PO TABS
2.0000 | ORAL_TABLET | ORAL | Status: DC
  Administered 2020-02-11 – 2020-02-14 (×3): 2 via ORAL
  Filled 2020-02-11 (×3): qty 2

## 2020-02-11 MED ORDER — MEPERIDINE HCL 25 MG/ML IJ SOLN: 6.2500 mg | INTRAMUSCULAR | Status: DC | PRN

## 2020-02-11 MED ORDER — DIBUCAINE (PERIANAL) 1 % EX OINT: 1.0000 "application " | TOPICAL_OINTMENT | CUTANEOUS | Status: DC | PRN

## 2020-02-11 MED ORDER — FENTANYL CITRATE (PF) 100 MCG/2ML IJ SOLN
INTRAMUSCULAR | Status: DC | PRN
Start: 2020-02-11 — End: 2020-02-11
  Administered 2020-02-11: 15 ug via INTRATHECAL

## 2020-02-11 MED ORDER — FENTANYL CITRATE (PF) 100 MCG/2ML IJ SOLN
INTRAMUSCULAR | Status: DC | PRN
Start: 2020-02-11 — End: 2020-02-11
  Administered 2020-02-11: 50 ug via INTRAVENOUS
  Administered 2020-02-11: 25 ug via INTRAVENOUS

## 2020-02-11 MED ORDER — PRENATAL MULTIVITAMIN CH
1.0000 | ORAL_TABLET | Freq: Every day | ORAL | Status: DC
  Administered 2020-02-12 – 2020-02-14 (×3): 1 via ORAL
  Filled 2020-02-11 (×3): qty 1

## 2020-02-11 MED ORDER — OXYCODONE-ACETAMINOPHEN 5-325 MG PO TABS: 1.0000 | ORAL_TABLET | ORAL | Status: DC | PRN

## 2020-02-11 MED ORDER — ONDANSETRON HCL 4 MG/2ML IJ SOLN
INTRAMUSCULAR | Status: DC | PRN
  Administered 2020-02-11: 4 mg via INTRAVENOUS

## 2020-02-11 MED ORDER — FENTANYL CITRATE (PF) 100 MCG/2ML IJ SOLN
INTRAMUSCULAR | Status: AC
  Filled 2020-02-11: qty 2

## 2020-02-11 MED ORDER — GABAPENTIN 100 MG PO CAPS
100.0000 mg | ORAL_CAPSULE | Freq: Three times a day (TID) | ORAL | Status: DC
  Administered 2020-02-11 – 2020-02-14 (×9): 100 mg via ORAL
  Filled 2020-02-11 (×9): qty 1

## 2020-02-11 MED ORDER — PHENYLEPHRINE HCL-NACL 20-0.9 MG/250ML-% IV SOLN
INTRAVENOUS | Status: AC
  Filled 2020-02-11: qty 250

## 2020-02-11 MED ORDER — MORPHINE SULFATE (PF) 0.5 MG/ML IJ SOLN
INTRAMUSCULAR | Status: DC | PRN
  Administered 2020-02-11: .1 mg via INTRATHECAL

## 2020-02-11 MED ORDER — OXYTOCIN-SODIUM CHLORIDE 30-0.9 UT/500ML-% IV SOLN
2.5000 [IU]/h | INTRAVENOUS | Status: AC
  Administered 2020-02-11: 2.5 [IU]/h via INTRAVENOUS
  Filled 2020-02-11: qty 500

## 2020-02-11 MED ORDER — LACTATED RINGERS IV SOLN: INTRAVENOUS | Status: DC

## 2020-02-11 MED ORDER — POVIDONE-IODINE 10 % EX SWAB
2.0000 "application " | Freq: Once | CUTANEOUS | Status: AC
  Administered 2020-02-11: 2 via TOPICAL

## 2020-02-11 MED ORDER — ACETAMINOPHEN 325 MG PO TABS: 325.0000 mg | ORAL_TABLET | ORAL | Status: DC | PRN

## 2020-02-11 MED ORDER — FENTANYL CITRATE (PF) 100 MCG/2ML IJ SOLN
INTRAMUSCULAR | Status: DC | PRN
Start: 2020-02-11 — End: 2020-02-11

## 2020-02-11 MED ORDER — CEFAZOLIN SODIUM-DEXTROSE 2-4 GM/100ML-% IV SOLN
INTRAVENOUS | Status: AC
  Filled 2020-02-11: qty 100

## 2020-02-11 MED ORDER — OXYCODONE HCL 5 MG PO TABS
5.0000 mg | ORAL_TABLET | ORAL | Status: DC | PRN
  Administered 2020-02-12 – 2020-02-14 (×8): 5 mg via ORAL
  Filled 2020-02-11 (×9): qty 1

## 2020-02-11 MED ORDER — OXYCODONE HCL 5 MG/5ML PO SOLN: 5.0000 mg | Freq: Once | ORAL | Status: DC | PRN

## 2020-02-11 MED ORDER — TETANUS-DIPHTH-ACELL PERTUSSIS 5-2.5-18.5 LF-MCG/0.5 IM SUSP: 0.5000 mL | Freq: Once | INTRAMUSCULAR | Status: DC

## 2020-02-11 MED ORDER — HYDROXYZINE HCL 25 MG PO TABS
25.0000 mg | ORAL_TABLET | Freq: Three times a day (TID) | ORAL | Status: DC | PRN
  Administered 2020-02-11: 25 mg via ORAL
  Filled 2020-02-11: qty 1

## 2020-02-11 MED ORDER — ACETAMINOPHEN 160 MG/5ML PO SOLN: 325.0000 mg | ORAL | Status: DC | PRN

## 2020-02-11 MED ORDER — DEXTROSE 5 % IV SOLN
INTRAVENOUS | Status: DC | PRN
  Administered 2020-02-11: 3 g via INTRAVENOUS

## 2020-02-11 MED ORDER — IBUPROFEN 800 MG PO TABS
800.0000 mg | ORAL_TABLET | Freq: Three times a day (TID) | ORAL | Status: AC
  Administered 2020-02-11 – 2020-02-14 (×9): 800 mg via ORAL
  Filled 2020-02-11 (×9): qty 1

## 2020-02-11 MED ORDER — MORPHINE SULFATE (PF) 0.5 MG/ML IJ SOLN
INTRAMUSCULAR | Status: AC
  Filled 2020-02-11: qty 10

## 2020-02-11 MED ORDER — BUPIVACAINE IN DEXTROSE 0.75-8.25 % IT SOLN
INTRATHECAL | Status: DC | PRN
  Administered 2020-02-11: 1.7 mL via INTRATHECAL

## 2020-02-11 MED ORDER — OXYTOCIN-SODIUM CHLORIDE 30-0.9 UT/500ML-% IV SOLN
INTRAVENOUS | Status: AC
  Filled 2020-02-11: qty 500

## 2020-02-11 MED ORDER — ENALAPRIL MALEATE 5 MG PO TABS
10.0000 mg | ORAL_TABLET | Freq: Every day | ORAL | Status: DC
  Administered 2020-02-11 – 2020-02-14 (×4): 10 mg via ORAL
  Filled 2020-02-11 (×4): qty 2

## 2020-02-11 MED ORDER — COCONUT OIL OIL: 1.0000 "application " | TOPICAL_OIL | Status: DC | PRN

## 2020-02-11 MED ORDER — ONDANSETRON HCL 4 MG/2ML IJ SOLN
INTRAMUSCULAR | Status: AC
  Filled 2020-02-11: qty 2

## 2020-02-11 MED ORDER — DIPHENHYDRAMINE HCL 50 MG/ML IJ SOLN
INTRAMUSCULAR | Status: AC
  Filled 2020-02-11: qty 1

## 2020-02-11 MED ORDER — DIPHENHYDRAMINE HCL 50 MG/ML IJ SOLN
INTRAMUSCULAR | Status: DC | PRN
  Administered 2020-02-11: 12.5 mg via INTRAVENOUS

## 2020-02-11 MED ORDER — FENTANYL CITRATE (PF) 100 MCG/2ML IJ SOLN: 25.0000 ug | INTRAMUSCULAR | Status: DC | PRN

## 2020-02-11 MED ORDER — ONDANSETRON HCL 4 MG/2ML IJ SOLN: 4.0000 mg | Freq: Once | INTRAMUSCULAR | Status: DC | PRN

## 2020-02-11 MED ORDER — OXYTOCIN-SODIUM CHLORIDE 30-0.9 UT/500ML-% IV SOLN
INTRAVENOUS | Status: DC | PRN
  Administered 2020-02-11: 200 mL via INTRAVENOUS

## 2020-02-11 MED ORDER — INSULIN GLARGINE 100 UNIT/ML ~~LOC~~ SOLN
15.0000 [IU] | Freq: Every day | SUBCUTANEOUS | Status: DC
  Administered 2020-02-11 – 2020-02-14 (×3): 15 [IU] via SUBCUTANEOUS
  Filled 2020-02-11 (×6): qty 0.15

## 2020-02-11 MED ORDER — SIMETHICONE 80 MG PO CHEW
80.0000 mg | CHEWABLE_TABLET | ORAL | Status: DC
  Administered 2020-02-11 – 2020-02-12 (×2): 80 mg via ORAL
  Filled 2020-02-11 (×3): qty 1

## 2020-02-11 MED ORDER — PHENYLEPHRINE HCL-NACL 20-0.9 MG/250ML-% IV SOLN
INTRAVENOUS | Status: DC | PRN
  Administered 2020-02-11: 60 ug/min via INTRAVENOUS

## 2020-02-11 MED ORDER — CEFAZOLIN SODIUM-DEXTROSE 2-4 GM/100ML-% IV SOLN: 2.0000 g | INTRAVENOUS | Status: DC

## 2020-02-11 MED ORDER — WITCH HAZEL-GLYCERIN EX PADS: 1.0000 "application " | MEDICATED_PAD | CUTANEOUS | Status: DC | PRN

## 2020-02-11 MED ORDER — MENTHOL 3 MG MT LOZG: 1.0000 | LOZENGE | OROMUCOSAL | Status: DC | PRN

## 2020-02-11 MED ORDER — DIPHENHYDRAMINE HCL 25 MG PO CAPS
25.0000 mg | ORAL_CAPSULE | Freq: Four times a day (QID) | ORAL | Status: DC | PRN
  Administered 2020-02-11 (×2): 25 mg via ORAL
  Filled 2020-02-11 (×2): qty 1

## 2020-02-11 MED ORDER — PHENYLEPHRINE 40 MCG/ML (10ML) SYRINGE FOR IV PUSH (FOR BLOOD PRESSURE SUPPORT)
PREFILLED_SYRINGE | INTRAVENOUS | Status: AC
  Filled 2020-02-11: qty 30

## 2020-02-11 MED ORDER — SODIUM CHLORIDE 0.9 % IR SOLN
Status: DC | PRN
  Administered 2020-02-11: 1

## 2020-02-11 MED ORDER — SIMETHICONE 80 MG PO CHEW: 80.0000 mg | CHEWABLE_TABLET | ORAL | Status: DC | PRN

## 2020-02-11 SURGICAL SUPPLY — 27 items
CLAMP CORD UMBIL (MISCELLANEOUS) IMPLANT
DRESSING PREVENA PLUS CUSTOM (GAUZE/BANDAGES/DRESSINGS) ×1 IMPLANT
DRSG OPSITE POSTOP 4X10 (GAUZE/BANDAGES/DRESSINGS) ×2 IMPLANT
DRSG PREVENA PLUS CUSTOM (GAUZE/BANDAGES/DRESSINGS) ×2
ELECT REM PT RETURN 9FT ADLT (ELECTROSURGICAL) ×2
ELECTRODE REM PT RTRN 9FT ADLT (ELECTROSURGICAL) ×1 IMPLANT
EXTRACTOR VACUUM M CUP 4 TUBE (SUCTIONS) IMPLANT
GLOVE BIOGEL PI IND STRL 6.5 (GLOVE) ×1 IMPLANT
GLOVE BIOGEL PI IND STRL 7.0 (GLOVE) ×1 IMPLANT
GLOVE BIOGEL PI INDICATOR 6.5 (GLOVE) ×1
GLOVE BIOGEL PI INDICATOR 7.0 (GLOVE) ×1
GLOVE SURG SS PI 6.0 STRL IVOR (GLOVE) ×2 IMPLANT
GOWN STRL REUS W/TWL LRG LVL3 (GOWN DISPOSABLE) ×4 IMPLANT
KIT ABG SYR 3ML LUER SLIP (SYRINGE) ×2 IMPLANT
NEEDLE HYPO 25X5/8 SAFETYGLIDE (NEEDLE) ×2 IMPLANT
NS IRRIG 1000ML POUR BTL (IV SOLUTION) ×2 IMPLANT
PACK C SECTION WH (CUSTOM PROCEDURE TRAY) ×2 IMPLANT
PAD OB MATERNITY 4.3X12.25 (PERSONAL CARE ITEMS) ×2 IMPLANT
PENCIL SMOKE EVAC W/HOLSTER (ELECTROSURGICAL) ×2 IMPLANT
RTRCTR C-SECT PINK 25CM LRG (MISCELLANEOUS) IMPLANT
SEPRAFILM MEMBRANE 5X6 (MISCELLANEOUS) IMPLANT
SUT PLAIN 0 NONE (SUTURE) ×2 IMPLANT
SUT VIC AB 0 CT1 36 (SUTURE) ×8 IMPLANT
SUT VIC AB 4-0 KS 27 (SUTURE) ×2 IMPLANT
TOWEL OR 17X24 6PK STRL BLUE (TOWEL DISPOSABLE) ×2 IMPLANT
TRAY FOLEY W/BAG SLVR 14FR LF (SET/KITS/TRAYS/PACK) ×2 IMPLANT
WATER STERILE IRR 1000ML POUR (IV SOLUTION) ×2 IMPLANT

## 2020-02-11 NOTE — Discharge Summary (Signed)
Postpartum Discharge Summary  Date of Service updated 02/14/20     Patient Name: Joyce Green DOB: [birth year] MRN: [id number]  Date of admission: 02/11/2020 Delivery date:02/11/2020  Delivering provider: CONST[place], [place]  Date of discharge: 02/14/2020  Admitting diagnosis: S/P cesarean section [Z98.891] Intrauterine pregnancy: [redacted]w[redacted]d    Secondary diagnosis:  Active Problems:   Obesity (BMI 30-39.9)   GBS (group B Streptococcus carrier), +RV culture, currently pregnant   DM (diabetes mellitus), type 2 [place]   Chronic hypertension affecting pregnancy   Supervision of high risk pregnancy, antepartum   Preexisting diabetes complicating pregnancy, antepartum   History of IUFD   History of VBAC   Polyhydramnios affecting pregnancy   Unwanted fertility   Cesarean delivery delivered   S/P tubal ligation   S/P cesarean section  Additional problems: none    Discharge diagnosis: Term Pregnancy Delivered, CHTN and Type 2 DM                                              Post partum procedures:postpartum tubal ligation Augmentation: N/A Complications: None  Hospital course: Sceduled C/S   34y.o. yo [id number] admitted to the hospital 02/11/2020 for scheduled cesarean section with the following indication:Elective Repeat.Delivery details are as follows:  Membrane Rupture Time/Date: 10:00 AM ,02/11/2020   Delivery Method:C-Section, Vacuum Assisted  Details of operation can be found in separate operative note.  Patient had an uncomplicated postpartum course.  She is ambulating, tolerating a regular diet, passing flatus, and urinating well. Patient is discharged home in stable condition on  02/14/20        Newborn Data: Birth date:02/11/2020  Birth time:10:02 AM  Gender:Female  Living status:Living  Apgars:7 ,9  Weight:3950 g     Magnesium Sulfate received: No BMZ received: No Rhophylac:N/A MMR:N/A T-DaP:Given prenatally Flu: N/A Transfusion:No  Physical exam  Vitals:    02/13/20 0512 02/13/20 1618 02/14/20 0012 02/14/20 0538  BP: 140/90 134/88 (!) 143/89 (!) 144/89  Pulse: 90 87 89 84  Resp: 18 18  18   Temp: 98.3 F (36.8 C) 98.7 F (37.1 C) 98.3 F (36.8 C) 98.5 F (36.9 C)  TempSrc: Oral Oral Oral   SpO2: 98%  100% 100%  Weight:      Height:       General: alert, cooperative and no distress Lochia: appropriate Uterine Fundus: firm Incision: Healing well with no significant drainage DVT Evaluation: No evidence of DVT seen on physical exam. Labs: Lab Results  Component Value Date   WBC 7.6 02/12/2020   HGB 9.2 (L) 02/12/2020   HCT 29.7 (L) 02/12/2020   MCV 79.0 (L) 02/12/2020   PLT 173 02/12/2020   CMP Latest Ref Rng & Units 02/11/2020  Glucose 65 - 99 mg/dL -  BUN 6 - 20 mg/dL -  Creatinine 0.44 - 1.00 mg/dL 0.65  Sodium 134 - 144 mmol/L -  Potassium 3.5 - 5.2 mmol/L -  Chloride 96 - 106 mmol/L -  CO2 20 - 29 mmol/L -  Calcium 8.7 - 10.2 mg/dL -  Total Protein 6.0 - 8.5 g/dL -  Total Bilirubin 0.0 - 1.2 mg/dL -  Alkaline Phos 39 - 117 IU/L -  AST 0 - 40 IU/L -  ALT 0 - 32 IU/L -   Edinburgh Score: Edinburgh Postnatal Depression Scale Screening Tool 02/13/2020  I  have been able to laugh and see the funny side of things. 0  I have looked forward with enjoyment to things. 0  I have blamed myself unnecessarily when things went wrong. 1  I have been anxious or worried for no good reason. 0  I have felt scared or panicky for no good reason. 0  Things have been getting on top of me. 0  I have been so unhappy that I have had difficulty sleeping. 0  I have felt sad or miserable. 0  I have been so unhappy that I have been crying. 0  The thought of harming myself has occurred to me. 0  Edinburgh Postnatal Depression Scale Total 1     After visit meds:  Allergies as of 02/14/2020   No Known Allergies     Medication List    STOP taking these medications   aspirin EC 81 MG tablet   Doxylamine-Pyridoxine 10-10 MG Tbec Commonly  known as: Diclegis   NIFEdipine 30 MG 24 hr tablet Commonly known as: PROCARDIA-XL/NIFEDICAL-XL   zolpidem 5 MG tablet Commonly known as: AMBIEN     TAKE these medications   Accu-Chek FastClix Lancets Misc 1 Device by Percutaneous route 4 (four) times daily.   Accu-Chek Nano SmartView w/Device Kit 1 kit by Subdermal route as directed. Check blood sugars for fasting, and two hours after breakfast, lunch and dinner (4 checks daily)   Accu-Chek SmartView test strip Generic drug: glucose blood Use as instructed to check blood sugars   glucose blood test strip Use as instructed   acetaminophen 500 MG tablet Commonly known as: TYLENOL Take 2 tablets (1,000 mg total) by mouth every 6 (six) hours as needed.   diphenhydramine-acetaminophen 25-500 MG Tabs tablet Commonly known as: TYLENOL PM Take 1-2 tablets by mouth at bedtime as needed (sleep).   enalapril 10 MG tablet Commonly known as: VASOTEC Take 1 tablet (10 mg total) by mouth daily.   ferrous sulfate 325 (65 FE) MG tablet Take 1 tablet (325 mg total) by mouth daily with breakfast.   hydrocortisone cream 0.5 % Apply 1 application topically 2 (two) times daily as needed (itching/blemishes.).   ibuprofen 800 MG tablet Commonly known as: ADVIL Take 1 tablet (800 mg total) by mouth every 8 (eight) hours as needed.   insulin glargine 100 unit/mL Sopn Commonly known as: LANTUS Inject 0.3 mLs (30 Units total) into the skin at bedtime.   metFORMIN 1000 MG tablet Commonly known as: Glucophage Take 1 tablet (1,000 mg total) by mouth 2 (two) times daily with a meal.   oxyCODONE 5 MG immediate release tablet Commonly known as: Oxy IR/ROXICODONE Take 1 tablet (5 mg total) by mouth every 6 (six) hours as needed for up to 3 days for severe pain or breakthrough pain.   PrePLUS 27-1 MG Tabs Take 1 tablet by mouth daily.            Discharge Care Instructions  (From admission, onward)         Start     Ordered    02/14/20 0000  If the dressing is still on your incision site when you go home, remove it on the third day after your surgery date. Remove dressing if it begins to fall off, or if it is dirty or damaged before the third day.        02/14/20 0959           Discharge home in stable condition Infant Feeding: Breast Infant Disposition:home with  mother Discharge instruction: per After Visit Summary and Postpartum booklet. Activity: Advance as tolerated. Pelvic rest for 6 weeks.  Diet: carb modified diet Future Appointments: Future Appointments  Date Time Provider [place]  02/18/2020  9:20 AM [facility] NURSE [facility]  03/15/2020  8:15 AM Virginia Rochester, NP [facility]  03/15/2020  9:30 AM [facility] [place]   Follow up Visit:  [facility] Follow up in 1 week(s).   Specialty: Obstetrics and Gynecology Why: For blood pressure check then 4 weeks for postpartum visit Contact information: [address] [place] [address] [phone]               Please schedule this patient for a In person postpartum visit in 4 weeks with the following provider: Any provider. Additional Postpartum F/U:2 hour GTT, Incision check 1 week and BP check 2-3 days  High risk pregnancy complicated by: HTN and type 2 DM Delivery mode:  C-Section, Vacuum Assisted  Anticipated Birth Control:  BTL done [facility]   02/14/2020 Fatima Blank, CNM

## 2020-02-11 NOTE — Discharge Instructions (Signed)

## 2020-02-11 NOTE — Anesthesia Postprocedure Evaluation (Signed)
Anesthesia Post Note  Patient: Joyce Green  Procedure(s) Performed: CESAREAN SECTION WITH BILATERAL TUBAL LIGATION (Bilateral )     Patient location during evaluation: PACU Anesthesia Type: Spinal Level of consciousness: oriented and awake and alert Pain management: pain level controlled Vital Signs Assessment: post-procedure vital signs reviewed and stable Respiratory status: spontaneous breathing, respiratory function stable and patient connected to nasal cannula oxygen Cardiovascular status: blood pressure returned to baseline and stable Postop Assessment: no headache, no backache and no apparent nausea or vomiting Anesthetic complications: no   No complications documented.  Last Vitals:  Vitals:   02/11/20 1400 02/11/20 1600  BP: (!) 144/90 (!) 147/91  Pulse:  91  Resp:  20  Temp:  36.7 C  SpO2:  98%    Last Pain:  Vitals:   02/11/20 1600  TempSrc: Oral  PainSc:    Pain Goal: Patients Stated Pain Goal: 3 (02/11/20 1459)                 Aundria Bitterman

## 2020-02-11 NOTE — Op Note (Addendum)
[name] PROCEDURE DATE: 02/11/2020  PREOPERATIVE DIAGNOSIS: Intrauterine pregnancy at  [redacted]w[redacted]d weeks gestation; previous uterine incision kerr x2  POSTOPERATIVE DIAGNOSIS: The same  PROCEDURE:     Cesarean Section and bilateral tubal ligation using Pomeroy method SURGEON:  Dr. Catalina Antigua  ASSISTANT: Dr A. Germaine Pomfret  INDICATIONS: Joyce Green is a 34 y.o. [phone] at [redacted]w[redacted]d scheduled for cesarean section secondary to previous uterine incision kerr x2.  The risks of cesarean section discussed with the patient included but were not limited to: bleeding which may require transfusion or reoperation; infection which may require antibiotics; injury to bowel, bladder, ureters or other surrounding organs; injury to the fetus; need for additional procedures including hysterectomy in the event of a life-threatening hemorrhage; placental abnormalities wth subsequent pregnancies, incisional problems, thromboembolic phenomenon and other postoperative/anesthesia complications. The patient concurred with the proposed plan, giving informed written consent for the procedure. Patient also desires permanent sterilization. Risks and benefits of procedure discussed with patient including permanence of method, bleeding, infection, injury to surrounding organs and need for additional procedures. Risk failure of 0.5-1% with increased risk of ectopic gestation if pregnancy occurs was also discussed with patient.   FINDINGS:  Viable female infant in cephalic presentation.  Apgars 7 and 9.  Clear amniotic fluid.  Intact placenta, three vessel cord.  Normal uterus, fallopian tubes and ovaries bilaterally. Minimal scarring noted  ANESTHESIA:    Spinal INTRAVENOUS FLUIDS:1000 ml ESTIMATED BLOOD LOSS: 589 ml URINE OUTPUT:  200 ml SPECIMENS: Placenta sent to L&D. Portions of fallopian tubes sent to pathology COMPLICATIONS: None immediate  PROCEDURE IN DETAIL:  The patient received intravenous antibiotics and  had sequential compression devices applied to her lower extremities while in the preoperative area.  She was then taken to the operating room where anesthesia was induced and was found to be adequate. A foley catheter was placed into her bladder and attached to Joyce Green gravity. She was then placed in a dorsal supine position with a leftward tilt, and prepped and draped in a sterile manner. After an adequate timeout was performed, a Pfannenstiel skin incision was made with scalpel and carried through to the underlying layer of fascia. The fascia was incised in the midline and this incision was extended bilaterally using the Mayo scissors. Kocher clamps were applied to the superior aspect of the fascial incision and the underlying rectus muscles were dissected off bluntly. A similar process was carried out on the inferior aspect of the facial incision. The rectus muscles were separated in the midline bluntly and the peritoneum was entered bluntly. The Alexis self-retaining retractor was introduced into the abdominal cavity. Attention was turned to the lower uterine segment where a transverse hysterotomy was made with a scalpel and extended bilaterally bluntly. The infant was successfully delivered with vacuum assistance and cord was clamped and cut and infant was handed over to awaiting neonatology team. Uterine massage was then administered and the placenta delivered intact with three-vessel cord. The uterus was cleared of clot and debris.  The hysterotomy was closed with 0 Vicryl in a running locked fashion, and an imbricating layer was also placed with a 0 Vicryl. Overall, excellent hemostasis was noted. The pelvis copiously irrigated and cleared of all clot and debris. Hemostasis was confirmed on all surfaces.  The patient's left fallopian tube was then identified, brought to the incision, and grasped with a Babcock clamp. The tube was then followed out to the fimbria. The Babcock clamp was then used to grasp the  tube  approximately 4 cm from the cornual region. A 3 cm segment of the tube was then ligated with free tie of plain gut suture, transected and excised. Good hemostasis was noted and the tube was returned to the abdomen. The right fallopian tube was then identified to its fimbriated end, ligated, and a 3 cm segment excised in a similar fashion. Excellent hemostasis was noted, and the tube returned to the abdomen. The peritoneum and the muscles were reapproximated using 0 vicryl interrupted stitches. The fascia was then closed using 0 Vicryl in a running fashion.  The skin was closed in a subcuticular fashion using 3.0 Vicryl. The patient tolerated the procedure well. Sponge, lap, instrument and needle counts were correct x 2. She was taken to the recovery room in stable condition. A prevena wound vac was applied over the incision   Joyce Longsworth ConstantMD  02/11/2020 10:36 AM

## 2020-02-11 NOTE — Anesthesia Preprocedure Evaluation (Addendum)
Anesthesia Evaluation  Patient identified by MRN, date of birth, ID band Patient awake    Reviewed: Allergy & Precautions, H&P , NPO status , Patient's Chart, lab work & pertinent test results, reviewed documented beta blocker date and time   History of Anesthesia Complications (+) history of anesthetic complications  Airway Mallampati: II  TM Distance: >3 FB Neck ROM: full    Dental no notable dental hx. (+) Teeth Intact, Dental Advisory Given   Pulmonary neg pulmonary ROS, former smoker,    Pulmonary exam normal breath sounds clear to auscultation       Cardiovascular hypertension, Normal cardiovascular exam Rhythm:regular Rate:Normal     Neuro/Psych negative neurological ROS  negative psych ROS   GI/Hepatic negative GI ROS, Neg liver ROS,   Endo/Other  diabetes, Type obesity  Renal/GU negative Renal ROS  negative genitourinary   Musculoskeletal   Abdominal   Peds  Hematology negative hematology ROS (+)   Anesthesia Other Findings   Reproductive/Obstetrics (+) Pregnancy                            Anesthesia Physical Anesthesia Plan  ASA: III  Anesthesia Plan: Spinal   Post-op Pain Management:    Induction:   PONV Risk Score and Plan: 2 and Scopolamine patch - Pre-op and Diphenhydramine  Airway Management Planned:   Additional Equipment: None  Intra-op Plan:   Post-operative Plan:   Informed Consent: I have reviewed the patients History and Physical, chart, labs and discussed the procedure including the risks, benefits and alternatives for the proposed anesthesia with the patient or authorized representative who has indicated his/her understanding and acceptance.       Plan Discussed with: Anesthesiologist and CRNA  Anesthesia Plan Comments: (  )       Anesthesia Quick Evaluation

## 2020-02-11 NOTE — Transfer of Care (Signed)
Immediate Anesthesia Transfer of Care Note  Patient: Joyce Green  Procedure(s) Performed: CESAREAN SECTION WITH BILATERAL TUBAL LIGATION (Bilateral )  Patient Location: PACU  Anesthesia Type:Spinal  Level of Consciousness: awake, alert  and oriented  Airway & Oxygen Therapy: Patient Spontanous Breathing  Post-op Assessment: Report given to RN and Post -op Vital signs reviewed and stable  Post vital signs: Reviewed and stable  Last Vitals:  Vitals Value Taken Time  BP 114/72 02/11/20 1052  Temp    Pulse 92 02/11/20 1058  Resp 19 02/11/20 1058  SpO2 97 % 02/11/20 1058  Vitals shown include unvalidated device data.  Last Pain: There were no vitals filed for this visit.       Complications: No complications documented.

## 2020-02-11 NOTE — Progress Notes (Signed)
Pt requested medicine for itching relief. Previous nurse administered 25mg  of benadryl PO. Pt reported itching was not relieved. Pt does not have Nubain ordered. RN called faculty to ask for Nubain and fasting glucose in am due to pt being Type II and taking lantus at bedtime. Faculty stated orders will be placed. 

## 2020-02-11 NOTE — H&P (Signed)
Joyce Green is a 34 y.o. female P48 at [redacted]w[redacted]d presenting for scheduled repeat cesarean section with bilateral tubal ligation. Patient is doing well today ans is without any complaints. She reports good fetal movement. She denies leakage of fluid, vaginal bleeding or regular contractions. Patient with prenatal care at [facility] complicated by [facility], T2DM, a history of IUFD at 33 weeks and 2 previous cesarean sections.  OB History    Gravida  4   Para  3   Term  2   Preterm  1   AB  0   Living  2     SAB  0   TAB  0   Ectopic  0   Multiple  0   Live Births  2          Past Medical History:  Diagnosis Date  . Bacteremia 03/2018   approximately 10 days after vbac  . Complication of anesthesia    intense itching after CS  . Diabetes mellitus without complication (HCC)    T2  . Hypertension   . Palpitations    Past Surgical History:  Procedure Laterality Date  . CESAREAN SECTION N/A 02/20/2014   Procedure: CESAREAN SECTION;  Surgeon: Joyce Arms, MD;  Location: [facility];  Service: Obstetrics;  Laterality: N/A;  . CESAREAN SECTION N/A 07/28/2016   Procedure: CESAREAN SECTION;  Surgeon: Joyce Antigua, MD;  Location: [facility];  Service: Obstetrics;  Laterality: N/A;   Family History: family history includes Cancer in her maternal grandfather, maternal grandmother, paternal grandfather, and paternal grandmother; Diabetes in her mother; Diabetes Mellitus I in her mother; Hypertension in her father and mother; Lung cancer in her father. Social History:  reports that she quit smoking about 10 years ago. Her smoking use included cigars. She smoked 0.00 packs per day for 0.00 years. She has never used smokeless tobacco. She reports previous alcohol use of about 4.0 standard drinks of alcohol per week. She reports previous drug use. Drug: Marijuana.     Maternal Diabetes: Yes:  Diabetes Type:  Pre-pregnancy, Insulin/Medication controlled Genetic Screening:  Normal Maternal Ultrasounds/Referrals: Normal Fetal Ultrasounds or other Referrals:  Fetal echo Maternal Substance Abuse:  No Significant Maternal Medications:  None Significant Maternal Lab Results:  Group B Strep positive Other Comments:  None  Review of Systems History   Blood pressure 128/87, pulse (!) 112, temperature 98.1 F (36.7 C), resp. rate 18, height 5\' 10"  (1.778 m), weight 119.7 kg, last menstrual period 05/27/2019, SpO2 100 %, currently breastfeeding. Exam Physical Exam  GENERAL: Well-developed, well-nourished female in no acute distress.  LUNGS: Clear to auscultation bilaterally.  HEART: Regular rate and rhythm. ABDOMEN: Soft, nontender, gravid PELVIC: Not indicated EXTREMITIES: No cyanosis, clubbing, or edema, 2+ distal pulses.  Prenatal labs: ABO, Rh: --/--/O POS (08/31 0901) Antibody: NEG (08/31 0901) Rubella: 4.62 (03/17 1112) RPR: NON REACTIVE (08/31 0907)  HBsAg: Negative (03/17 1112)  HIV: Non Reactive (03/17 1112)  GBS: Positive/-- (08/18 1605)   Assessment/Plan: 34 yo P2102 at [redacted]w[redacted]d with CHTN and type 2 DM here for scheduled repeat cesarean section  - Risks, benefits and alternatives were explained including but not limited to risks of bleeding, infection and damage to adjacent organs. Patient verbalized understanding and all questions were answered - Patient also desires permanent sterilization. Risks and benefits of procedure discussed with patient including permanence of method, bleeding, infection, injury to surrounding organs and need for additional procedures. Risk failure of 0.5-1% with increased risk of ectopic gestation  if pregnancy occurs was also discussed with patient.     Joyce Green 02/11/2020, 9:07 AM

## 2020-02-12 ENCOUNTER — Encounter (HOSPITAL_COMMUNITY): Payer: Self-pay | Admitting: Obstetrics and Gynecology

## 2020-02-12 LAB — CBC
HCT: 29.7 % — ABNORMAL LOW (ref 36.0–46.0)
Hemoglobin: 9.2 g/dL — ABNORMAL LOW (ref 12.0–15.0)
MCH: 24.5 pg — ABNORMAL LOW (ref 26.0–34.0)
MCHC: 31 g/dL (ref 30.0–36.0)
MCV: 79 fL — ABNORMAL LOW (ref 80.0–100.0)
Platelets: 173 10*3/uL (ref 150–400)
RBC: 3.76 MIL/uL — ABNORMAL LOW (ref 3.87–5.11)
RDW: 15.1 % (ref 11.5–15.5)
WBC: 7.6 10*3/uL (ref 4.0–10.5)
nRBC: 0 % (ref 0.0–0.2)

## 2020-02-12 LAB — GLUCOSE, CAPILLARY
Glucose-Capillary: 95 mg/dL (ref 70–99)
Glucose-Capillary: 72 mg/dL (ref 70–99)
Glucose-Capillary: 83 mg/dL (ref 70–99)

## 2020-02-12 LAB — SURGICAL PATHOLOGY

## 2020-02-12 MED ORDER — FERROUS SULFATE 325 (65 FE) MG PO TABS
325.0000 mg | ORAL_TABLET | Freq: Every day | ORAL | Status: DC
  Administered 2020-02-12 – 2020-02-14 (×3): 325 mg via ORAL
  Filled 2020-02-12 (×3): qty 1

## 2020-02-12 MED ORDER — ENALAPRIL MALEATE 10 MG PO TABS
10.0000 mg | ORAL_TABLET | Freq: Every day | ORAL | 0 refills | Status: DC
Start: 2020-02-12 — End: 2020-02-19

## 2020-02-12 MED ORDER — NALBUPHINE HCL 10 MG/ML IJ SOLN
5.0000 mg | Freq: Once | INTRAMUSCULAR | Status: AC
  Administered 2020-02-12: 5 mg via INTRAVENOUS
  Filled 2020-02-12: qty 1

## 2020-02-12 MED ORDER — OXYCODONE HCL 5 MG PO TABS
5.0000 mg | ORAL_TABLET | Freq: Four times a day (QID) | ORAL | 0 refills | Status: AC | PRN
Start: 2020-02-12 — End: 2020-02-15

## 2020-02-12 MED ORDER — FERROUS SULFATE 325 (65 FE) MG PO TABS
325.0000 mg | ORAL_TABLET | Freq: Every day | ORAL | 0 refills | Status: DC
Start: 2020-02-12 — End: 2020-04-26

## 2020-02-12 MED ORDER — IBUPROFEN 800 MG PO TABS
800.0000 mg | ORAL_TABLET | Freq: Three times a day (TID) | ORAL | 0 refills | Status: DC | PRN
Start: 2020-02-12 — End: 2020-03-15

## 2020-02-12 MED ORDER — ACETAMINOPHEN 500 MG PO TABS
1000.0000 mg | ORAL_TABLET | Freq: Four times a day (QID) | ORAL | 0 refills | Status: DC | PRN
Start: 2020-02-12 — End: 2023-09-24

## 2020-02-12 MED FILL — ENALAPRIL MALEATE 10 MG TAB: 10 | 30 days supply | Qty: 30 | Fill #0

## 2020-02-12 MED FILL — IBUPROFEN 800 MG TAB: 800 | 10 days supply | Qty: 30 | Fill #0

## 2020-02-12 MED FILL — oxyCODONE HCL 5 MG TABS: 5 | 2 days supply | Qty: 8 | Fill #0

## 2020-02-12 MED FILL — FERROUS SULFATE 325 MG TAB: 325 (65 FE) | 30 days supply | Qty: 30 | Fill #0

## 2020-02-12 NOTE — Progress Notes (Addendum)
POSTPARTUM PROGRESS NOTE  POD #1  Subjective:  Joyce Green is a 34 y.o. [phone] s/p erLTCS at [redacted]w[redacted]d. No acute events overnight. She reports she is doing well. She denies any problems with ambulating, voiding or po intake. Denies nausea or vomiting. She has not passed flatus yet. Pain is well controlled.  Lochia is mild. Pt does report diffuse itching that has not improved with antihistamines.  Objective: Blood pressure 137/83, pulse 84, temperature 98.5 F (36.9 C), resp. rate 17, height 5\' 10"  (1.778 m), weight 119.7 kg, last menstrual period 05/27/2019, SpO2 98 %, unknown if currently breastfeeding.  Physical Exam:  General: alert, cooperative and no distress Chest: no respiratory distress Heart:regular rate, distal pulses intact Abdomen: soft, nontender  Uterine Fundus: firm, appropriately tender Extremities: 1+ pitting edema to mid shin bilaterally, equal in size  Skin: warm, dry; incision clean/dry/intact w/ dressing in place  Recent Labs    02/11/20 1259 02/12/20 0550  HGB 10.5* 9.2*  HCT 34.5* 29.7*    Assessment/Plan: Joyce Green is a 34 y.o. 32 s/p elective rLTCS at [redacted]w[redacted]d.  POD#1 - Doing welll; pain well controlled.   Routine postpartum care  OOB, ambulated  Lovenox for VTE prophylaxis  Anemia: Hgb 9.2, asymptomatic  Start po ferrous sulfate daily   Chronic HTN: Stable.  SBP 130s. Cont enalapril 10mg  as is for now.   T2DM: Chronic, stable.  CBGs average 90-100. Cont home Metformin 1000mg  BID + lantus 15 U daily.  --Monitor CBGs Ac & qhs   Diffuse Pruritis: Will administer 1 dose of IV nubain 5mg  and monitor for symptom improvement.  Contraception: BTL Feeding: Breastfeeding, donor breast milk   Dispo: Plan for discharge tomorrow or the following day.   LOS: 1 day   [redacted]w[redacted]d, DO Family Medicine PGY-3  02/12/2020, 9:03 AM  Attestation of Supervision of Student:  I confirm that I have verified the information documented in the   resident's  note and that I have also personally reperformed the history, physical exam and all medical decision making activities.  I have verified that all services and findings are accurately documented in this student's note; and I agree with management and plan as outlined in the documentation. I have also made any necessary editorial changes.  , MD Center for [facility], [facility] 02/12/2020 10:33 PM

## 2020-02-12 NOTE — Progress Notes (Signed)
Glucose 74 @14 :57 on pt's own meter after eating salmon, green beans, collards & potatoes.

## 2020-02-13 LAB — GLUCOSE, CAPILLARY
Glucose-Capillary: 79 mg/dL (ref 70–99)
Glucose-Capillary: 89 mg/dL (ref 70–99)
Glucose-Capillary: 132 mg/dL — ABNORMAL HIGH (ref 70–99)

## 2020-02-13 NOTE — Progress Notes (Addendum)
POSTPARTUM PROGRESS NOTE  POD #2  Subjective:  Joyce Green is a 34 y.o. [phone] s/p elective LTCS at [redacted]w[redacted]d.  She reports she doing well, very sore at incision site. No acute events overnight. She denies any problems with ambulating, voiding or po intake. Denies nausea or vomiting. She has passed flatus. Pain is moderately controlled.  Lochia is mild.  Objective: Blood pressure 140/90, pulse 90, temperature 98.3 F (36.8 C), temperature source Oral, resp. rate 18, height 5\' 10"  (1.778 m), weight 119.7 kg, last menstrual period 05/27/2019, SpO2 98 %, unknown if currently breastfeeding.  Physical Exam:  General: alert, cooperative and no distress Chest: no respiratory distress Heart:regular rate, distal pulses intact Abdomen: soft, nontender  Uterine Fundus: firm, appropriately tender DVT Evaluation: No calf swelling or tenderness Extremities: minimal edema Skin: warm, dry; incision clean/dry/intact w/ prevena w/ suction in place and dressing   Recent Labs    02/11/20 1259 02/12/20 0550  HGB 10.5* 9.2*  HCT 34.5* 29.7*    Assessment/Plan: Joyce Green is a 34 y.o. 32 s/p elective rLTC/S at [redacted]w[redacted]d.  POD#2 - Doing welll; pain moderately controlled.  Routine postpartum care  OOB, ambulated  Lovenox for VTE prophylaxis Anemia: asymptomatic  Cont ferrous sulfate daily.   Chronic HTN: Stable.  SBP 120-140/83-94 in the past 24 hours. Asymptomatic. Cont enalapril 10mg , could consider adding norvac 5mg   if persistent elevation despite adequate pain control.   Type 2 DM: Stable.  CBGS 90s. Cont Lantus 15U and metformin as is.   Diffuse pruritis:  Resolved.   Contraception: BTL Feeding: Breastfeeding and additional donor milk   Dispo: Infant in NICU. Plan for discharge tomorrow.   LOS: 2 days   [redacted]w[redacted]d, DO  Family Medicine PGY-3  02/13/2020, 7:54 AM  Attestation of Supervision of Student:  I confirm that I have verified the information documented in  the  resident's  note and that I have also personally reperformed the history, physical exam and all medical decision making activities.  I have verified that all services and findings are accurately documented in this student's note; and I agree with management and plan as outlined in the documentation. I have also made any necessary editorial changes.  , MD Center for [facility], [facility] 02/13/2020 9:50 AM

## 2020-02-13 NOTE — Lactation Note (Signed)
Lactation Consultation Note  Patient Name: Joyce Green VHQIO'N Date: 02/13/2020 Reason for consult: Initial assessment Baby 46hrs old, wt loss 2%, receiving BM and DBM, in NICU. Mom sitting in bed hand expressing colostrum obtained ~ 48ml from 1 breast, dad resting on couch. Mom reports baby transferred to NICU last night d/t blood sugar levels, has pumped x2 since transfer. Mom would like to exclusively breastfeed x34yr, reports having challenges previously with breastfeeding. Reports breastfed for 3weeks with first child (now 5yo), and for 1.64mo with second child (now 3yo). Reviewed pump frequency q3hrs or after every feeding if baby going to breast, hand express after pumping, offer milk back to baby, mom considering supplementing with formula upon discharge if needed. Discussed milk storage guidelines, skin to skin, engorgement and how to manage, support groups. Mom has a Spectra DEBP at home, encouraged mom to follow up with outpatient [facility] services or WIC as needed. Left the room with mom sitting in bed and dad at bedside, states plans to hand express opposite breast and take colostrum to NICU. BGilliam, RN, IBCLC   Maternal Data Formula Feeding for Exclusion: No Has patient been taught Hand Expression?: Yes Does the patient have breastfeeding experience prior to this delivery?: Yes  Feeding    LATCH Score                   Interventions Interventions: Breast feeding basics reviewed  Lactation Tools Discussed/Used WIC Program: Yes   Consult Status Consult Status: Follow-up Date: 02/14/20 Follow-up type: In-patient    Charlynn Court 02/13/2020, 8:19 AM

## 2020-02-13 NOTE — Progress Notes (Addendum)
MOB forgot to call RN before she ate breakfast. States that she checked her blood sugar @0818  and her glucose was 68.  RN advised mom to call RN before subsequent feedings to check bloodsugar. MOB verbalized understanding.

## 2020-02-14 LAB — GLUCOSE, CAPILLARY: Glucose-Capillary: 76 mg/dL (ref 70–99)

## 2020-02-16 ENCOUNTER — Other Ambulatory Visit: Payer: Self-pay | Admitting: Lactation Services

## 2020-02-16 ENCOUNTER — Other Ambulatory Visit: Payer: Self-pay | Admitting: Obstetrics & Gynecology

## 2020-02-16 DIAGNOSIS — Z98891 History of uterine scar from previous surgery: Secondary | ICD-10-CM

## 2020-02-16 MED ORDER — OXYCODONE HCL 5 MG PO TABS: 5.0000 mg | ORAL_TABLET | Freq: Four times a day (QID) | ORAL | 0 refills | Status: DC | PRN

## 2020-02-16 NOTE — Progress Notes (Signed)
Opened in error

## 2020-02-18 ENCOUNTER — Other Ambulatory Visit: Payer: Self-pay

## 2020-02-18 ENCOUNTER — Ambulatory Visit: Payer: Self-pay

## 2020-02-18 ENCOUNTER — Observation Stay (HOSPITAL_COMMUNITY)
Admission: AD | Admit: 2020-02-18 | Discharge: 2020-02-19 | Disposition: A | Discharge status: Home / Self Care | Payer: Medicaid Other | Attending: Obstetrics & Gynecology | Admitting: Obstetrics & Gynecology

## 2020-02-18 ENCOUNTER — Ambulatory Visit (INDEPENDENT_AMBULATORY_CARE_PROVIDER_SITE_OTHER): Payer: Medicaid Other | Admitting: *Deleted

## 2020-02-18 ENCOUNTER — Encounter: Payer: Self-pay | Admitting: *Deleted

## 2020-02-18 ENCOUNTER — Encounter (HOSPITAL_COMMUNITY): Payer: Self-pay | Admitting: Obstetrics & Gynecology

## 2020-02-18 VITALS — BP 170/97 | HR 90 | Ht 70.0 in | Wt 254.1 lb

## 2020-02-18 DIAGNOSIS — Z9851 Tubal ligation status: Secondary | ICD-10-CM

## 2020-02-18 DIAGNOSIS — O165 Unspecified maternal hypertension, complicating the puerperium: Secondary | ICD-10-CM | POA: Diagnosis present

## 2020-02-18 DIAGNOSIS — O1415 Severe pre-eclampsia, complicating the puerperium: Principal | ICD-10-CM | POA: Insufficient documentation

## 2020-02-18 DIAGNOSIS — O99893 Other specified diseases and conditions complicating puerperium: Secondary | ICD-10-CM | POA: Insufficient documentation

## 2020-02-18 DIAGNOSIS — R519 Headache, unspecified: Secondary | ICD-10-CM | POA: Diagnosis not present

## 2020-02-18 DIAGNOSIS — Z0130 Encounter for examination of blood pressure without abnormal findings: Secondary | ICD-10-CM

## 2020-02-18 DIAGNOSIS — O1495 Unspecified pre-eclampsia, complicating the puerperium: Secondary | ICD-10-CM

## 2020-02-18 DIAGNOSIS — E1165 Type 2 diabetes mellitus with hyperglycemia: Secondary | ICD-10-CM

## 2020-02-18 DIAGNOSIS — E119 Type 2 diabetes mellitus without complications: Secondary | ICD-10-CM

## 2020-02-18 DIAGNOSIS — Z8759 Personal history of other complications of pregnancy, childbirth and the puerperium: Secondary | ICD-10-CM | POA: Diagnosis present

## 2020-02-18 DIAGNOSIS — O2493 Unspecified diabetes mellitus in the puerperium: Secondary | ICD-10-CM | POA: Insufficient documentation

## 2020-02-18 DIAGNOSIS — Z5189 Encounter for other specified aftercare: Secondary | ICD-10-CM

## 2020-02-18 DIAGNOSIS — O119 Pre-existing hypertension with pre-eclampsia, unspecified trimester: Secondary | ICD-10-CM | POA: Diagnosis present

## 2020-02-18 LAB — COMPREHENSIVE METABOLIC PANEL
ALT: 17 U/L (ref 0–44)
AST: 23 U/L (ref 15–41)
Albumin: 2.6 g/dL — ABNORMAL LOW (ref 3.5–5.0)
Alkaline Phosphatase: 66 U/L (ref 38–126)
Anion gap: 9 (ref 5–15)
BUN: 11 mg/dL (ref 6–20)
CO2: 21 mmol/L — ABNORMAL LOW (ref 22–32)
Calcium: 8.5 mg/dL — ABNORMAL LOW (ref 8.9–10.3)
Chloride: 109 mmol/L (ref 98–111)
Creatinine, Ser: 0.6 mg/dL (ref 0.44–1.00)
GFR calc Af Amer: >60 mL/min (ref >60)
GFR calc non Af Amer: >60 mL/min (ref >60)
Glucose, Bld: 72 mg/dL (ref 70–99)
Potassium: 3.8 mmol/L (ref 3.5–5.1)
Sodium: 139 mmol/L (ref 135–145)
Total Bilirubin: 0.5 mg/dL (ref 0.3–1.2)
Total Protein: 6.2 g/dL — ABNORMAL LOW (ref 6.5–8.1)

## 2020-02-18 LAB — CBC
HCT: 33.4 % — ABNORMAL LOW (ref 36.0–46.0)
Hemoglobin: 10.2 g/dL — ABNORMAL LOW (ref 12.0–15.0)
MCH: 24.9 pg — ABNORMAL LOW (ref 26.0–34.0)
MCHC: 30.5 g/dL (ref 30.0–36.0)
MCV: 81.5 fL (ref 80.0–100.0)
Platelets: 282 10*3/uL (ref 150–400)
RBC: 4.1 MIL/uL (ref 3.87–5.11)
RDW: 15.6 % — ABNORMAL HIGH (ref 11.5–15.5)
WBC: 5.7 10*3/uL (ref 4.0–10.5)
nRBC: 0 % (ref 0.0–0.2)

## 2020-02-18 LAB — GLUCOSE, CAPILLARY
Glucose-Capillary: 76 mg/dL (ref 70–99)
Glucose-Capillary: 80 mg/dL (ref 70–99)

## 2020-02-18 LAB — PROTEIN / CREATININE RATIO, URINE
Creatinine, Urine: 38.93 mg/dL
Protein Creatinine Ratio: 0.26 mg/mg{Cre} — ABNORMAL HIGH (ref 0.00–0.15)
Total Protein, Urine: 10 mg/dL

## 2020-02-18 MED ORDER — MAGNESIUM SULFATE BOLUS VIA INFUSION
4.0000 g | Freq: Once | INTRAVENOUS | Status: AC
  Administered 2020-02-18: 4 g via INTRAVENOUS
  Filled 2020-02-18: qty 1000

## 2020-02-18 MED ORDER — ALUM & MAG HYDROXIDE-SIMETH 200-200-20 MG/5ML PO SUSP: 30.0000 mL | ORAL | Status: DC | PRN

## 2020-02-18 MED ORDER — HYDRALAZINE HCL 20 MG/ML IJ SOLN
5.0000 mg | INTRAMUSCULAR | Status: DC | PRN
  Administered 2020-02-18 (×2): 5 mg via INTRAVENOUS
  Filled 2020-02-18 (×2): qty 1

## 2020-02-18 MED ORDER — ACCU-CHEK FASTCLIX LANCETS MISC: 1.0000 | Freq: Four times a day (QID) | Status: DC

## 2020-02-18 MED ORDER — INSULIN GLARGINE 100 UNIT/ML ~~LOC~~ SOLN: 15.0000 [IU] | Freq: Every day | SUBCUTANEOUS | Status: DC

## 2020-02-18 MED ORDER — PREPLUS 27-1 MG PO TABS: 1.0000 | ORAL_TABLET | Freq: Every day | ORAL | Status: DC

## 2020-02-18 MED ORDER — ONDANSETRON HCL 4 MG PO TABS: 4.0000 mg | ORAL_TABLET | Freq: Four times a day (QID) | ORAL | Status: DC | PRN

## 2020-02-18 MED ORDER — OXYCODONE HCL 5 MG PO TABS
5.0000 mg | ORAL_TABLET | Freq: Four times a day (QID) | ORAL | Status: DC | PRN
  Administered 2020-02-18: 5 mg via ORAL
  Filled 2020-02-18: qty 1

## 2020-02-18 MED ORDER — LABETALOL HCL 5 MG/ML IV SOLN
40.0000 mg | INTRAVENOUS | Status: DC | PRN
  Administered 2020-02-18: 40 mg via INTRAVENOUS
  Filled 2020-02-18: qty 8

## 2020-02-18 MED ORDER — ONDANSETRON HCL 4 MG/2ML IJ SOLN: 4.0000 mg | Freq: Four times a day (QID) | INTRAMUSCULAR | Status: DC | PRN

## 2020-02-18 MED ORDER — LACTATED RINGERS IV SOLN: INTRAVENOUS | Status: DC

## 2020-02-18 MED ORDER — MAGNESIUM CITRATE PO SOLN
1.0000 | Freq: Once | ORAL | Status: DC | PRN
  Filled 2020-02-18: qty 296

## 2020-02-18 MED ORDER — MAGNESIUM SULFATE 40 GM/1000ML IV SOLN
2.0000 g/h | INTRAVENOUS | Status: AC
  Administered 2020-02-18 – 2020-02-19 (×2): 2 g/h via INTRAVENOUS
  Filled 2020-02-18 (×2): qty 1000

## 2020-02-18 MED ORDER — INSULIN GLARGINE 100 UNIT/ML ~~LOC~~ SOLN
15.0000 [IU] | Freq: Every day | SUBCUTANEOUS | Status: DC
  Administered 2020-02-18: 15 [IU] via SUBCUTANEOUS
  Filled 2020-02-18 (×2): qty 0.15

## 2020-02-18 MED ORDER — ENALAPRIL MALEATE 5 MG PO TABS
20.0000 mg | ORAL_TABLET | Freq: Every day | ORAL | Status: DC
  Administered 2020-02-19: 20 mg via ORAL
  Filled 2020-02-18: qty 4

## 2020-02-18 MED ORDER — ENALAPRIL MALEATE 5 MG PO TABS
10.0000 mg | ORAL_TABLET | Freq: Once | ORAL | Status: AC
  Administered 2020-02-18: 10 mg via ORAL
  Filled 2020-02-18: qty 2

## 2020-02-18 MED ORDER — INSULIN GLARGINE 100 UNIT/ML ~~LOC~~ SOLN
30.0000 [IU] | Freq: Every day | SUBCUTANEOUS | Status: DC
  Filled 2020-02-18: qty 0.3

## 2020-02-18 MED ORDER — LABETALOL HCL 5 MG/ML IV SOLN
20.0000 mg | INTRAVENOUS | Status: DC | PRN
  Administered 2020-02-18: 20 mg via INTRAVENOUS
  Filled 2020-02-18: qty 4

## 2020-02-18 MED ORDER — SENNOSIDES-DOCUSATE SODIUM 8.6-50 MG PO TABS: 1.0000 | ORAL_TABLET | Freq: Every evening | ORAL | Status: DC | PRN

## 2020-02-18 MED ORDER — HYDRALAZINE HCL 20 MG/ML IJ SOLN
10.0000 mg | INTRAMUSCULAR | Status: DC | PRN
  Administered 2020-02-18 (×2): 10 mg via INTRAVENOUS

## 2020-02-18 MED ORDER — METFORMIN HCL 500 MG PO TABS
1000.0000 mg | ORAL_TABLET | Freq: Two times a day (BID) | ORAL | Status: DC
  Administered 2020-02-19: 1000 mg via ORAL
  Filled 2020-02-18: qty 2

## 2020-02-18 MED ORDER — DOCUSATE SODIUM 100 MG PO CAPS
100.0000 mg | ORAL_CAPSULE | Freq: Two times a day (BID) | ORAL | Status: DC
  Administered 2020-02-18 – 2020-02-19 (×2): 100 mg via ORAL
  Filled 2020-02-18 (×2): qty 1

## 2020-02-18 MED ORDER — PRENATAL MULTIVITAMIN CH
1.0000 | ORAL_TABLET | Freq: Every day | ORAL | Status: DC
  Administered 2020-02-19: 1 via ORAL
  Filled 2020-02-18: qty 1

## 2020-02-18 MED ORDER — SIMETHICONE 80 MG PO CHEW: 80.0000 mg | CHEWABLE_TABLET | Freq: Four times a day (QID) | ORAL | Status: DC | PRN

## 2020-02-18 MED ORDER — ZOLPIDEM TARTRATE 5 MG PO TABS: 5.0000 mg | ORAL_TABLET | Freq: Every evening | ORAL | Status: DC | PRN

## 2020-02-18 MED ORDER — ACETAMINOPHEN 500 MG PO TABS
1000.0000 mg | ORAL_TABLET | Freq: Once | ORAL | Status: AC
  Administered 2020-02-18: 1000 mg via ORAL
  Filled 2020-02-18: qty 2

## 2020-02-18 MED ORDER — BISACODYL 10 MG RE SUPP: 10.0000 mg | Freq: Every day | RECTAL | Status: DC | PRN

## 2020-02-18 MED ORDER — IBUPROFEN 800 MG PO TABS
800.0000 mg | ORAL_TABLET | Freq: Three times a day (TID) | ORAL | Status: DC | PRN
  Administered 2020-02-18 – 2020-02-19 (×2): 800 mg via ORAL
  Filled 2020-02-18 (×2): qty 1

## 2020-02-18 NOTE — Lactation Note (Signed)
Lactation saw this readmit and charted in baby's chart.

## 2020-02-18 NOTE — Progress Notes (Signed)
Here for incision check . States had headache started 2 nights ago and has " goes and come"- has taken vasotec and ibuprofen or tylenol for incision - states " I think so " as far as is it helping her headache. Does have 2+-3+ edema in feet/ ankles , none in hands. Denies visual floaters but states light hurts her eyes. States bp at home 174/98, 174/100. Today in office bp 172/97.  Incision still intact with wound vac, Wound vac removed saturated  with old bloody drainage. Right mid to right has areas of pink edges showing.   Discussed wound and bp's , symptoms, history with Dr. Mayford Knife. Dr. Mayford Knife in to see pt, applied silver nitrate to wound. Advised pt to go to MAU for possible preeclampsia evaluation. She voices understanding. Joyce Bisig,RN

## 2020-02-18 NOTE — MAU Note (Signed)
Sent from MD office for BP evaluation.  Currently doesn't have a H/A but did en route to hospital.  Denies epigastric pain or visual disturbances.  S/P Cesarean 02/11/2020.  Hx of CHTN was on meds during pregnancy.

## 2020-02-18 NOTE — H&P (Signed)
Joyce Green is a 34 y.o. [phone] s/p CS on 02/11/20 who presents for a preeclampsia evaluation. She was sent to MAU from the office after two severe range blood pressures and a recent history of headaches. Her headaches resolve temporarily with Tylenol. She denies spotty vision but is light sensitive. She denied headache in the office but one developed on the way to MAU (3/10).   OB History    Gravida  4   Para  4   Term  3   Preterm  1   AB  0   Living  3     SAB  0   TAB  0   Ectopic  0   Multiple  0   Live Births  3          Past Medical History:  Diagnosis Date  . Bacteremia 03/2018   approximately 10 days after vbac  . Complication of anesthesia    intense itching after CS  . Diabetes mellitus without complication (HCC)    T2  . Hypertension   . Palpitations    Past Surgical History:  Procedure Laterality Date  . CESAREAN SECTION N/A 02/20/2014   Procedure: CESAREAN SECTION;  Surgeon: Lazaro Arms, MD;  Location: [facility];  Service: Obstetrics;  Laterality: N/A;  . CESAREAN SECTION N/A 07/28/2016   Procedure: CESAREAN SECTION;  Surgeon: Catalina Antigua, MD;  Location: [facility];  Service: Obstetrics;  Laterality: N/A;  . CESAREAN SECTION WITH BILATERAL TUBAL LIGATION Bilateral 02/11/2020   Procedure: CESAREAN SECTION WITH BILATERAL TUBAL LIGATION;  Surgeon: Catalina Antigua, MD;  Location: [facility];  Service: Obstetrics;  Laterality: Bilateral;   Family History: family history includes Cancer in her maternal grandfather, maternal grandmother, paternal grandfather, and paternal grandmother; Diabetes in her mother; Diabetes Mellitus I in her mother; Hypertension in her father and mother; Lung cancer in her father. Social History:  reports that she quit smoking about 10 years ago. Her smoking use included cigars. She smoked 0.00 packs per day for 0.00 years. She has never used smokeless tobacco. She reports previous alcohol use of about 4.0 standard  drinks of alcohol per week. She reports previous drug use. Drug: Marijuana.     Maternal Diabetes: Yes:  Diabetes Type:  Diet controlled Genetic Screening: Normal Maternal Ultrasounds/Referrals: Normal Fetal Ultrasounds or other Referrals:  None Maternal Substance Abuse:  No Significant Maternal Medications:  None Significant Maternal Lab Results:  Group B Strep positive Other Comments:  None  Review of Systems  Eyes: Positive for photophobia.  Neurological: Positive for headaches (current 3/10).  All other systems reviewed and are negative.   Blood pressure (!) 180/93, pulse 90, temperature 98.2 F (36.8 C), temperature source Oral, resp. rate 20, height 5\' 10"  (1.778 m), weight 253 lb 9.6 oz (115 kg), SpO2 99 %, unknown if currently breastfeeding. Exam Physical Exam Vitals and nursing note reviewed.  Constitutional:      Appearance: She is well-developed.  Eyes:     Extraocular Movements: Extraocular movements intact.     Pupils: Pupils are equal, round, and reactive to light.  Cardiovascular:     Rate and Rhythm: Normal rate and regular rhythm.     Heart sounds: Normal heart sounds.  Pulmonary:     Effort: Pulmonary effort is normal.     Breath sounds: Normal breath sounds.  Abdominal:     Palpations: Abdomen is soft.  Musculoskeletal:        General: Normal range of motion.  Cervical back: Normal range of motion.  Skin:    General: Skin is warm and dry.     Capillary Refill: Capillary refill takes less than 2 seconds.  Neurological:     Mental Status: She is alert.  Psychiatric:        Mood and Affect: Mood normal.        Speech: Speech normal.        Behavior: Behavior normal.     Prenatal labs: ABO, Rh: --/--/O POS (08/31 0901) Antibody: NEG (08/31 0901) Rubella: 4.62 (03/17 1112) RPR: NON REACTIVE (08/31 0907)  HBsAg: Negative (03/17 1112)  HIV: Non Reactive (03/17 1112)  GBS: Positive/-- (08/18 1605)   Assessment/Plan: Discussed continued severe  range pressures and HA with Dr. Macon Large who agreed to admission and magnesium therapy.  Admit to [facility] for magnesium therapy  Begin hydralazine protocol  Edd Arbour, CNM, MSN, [facility] 02/18/20 12:23 PM

## 2020-02-18 NOTE — Progress Notes (Signed)
RN given verbal order to change Lantus from 30units to 15 units. Pt states she takes 15 units at home.

## 2020-02-18 NOTE — Lactation Note (Signed)
This note was copied from a baby's chart. Lactation Consultation Note  Patient Name: Joyce Green [id number] Date: 02/18/2020 Reason for consult: Follow-up assessment;NICU baby;Early term 37-38.6wks  1445 - 1500 - Lactation visited Ms. Chabot who was readmitted to [facility] today. Her daughter is in the NICU, and baby is likely to be discharged today or tomorrow. I checked to make sure that Ms. Dix has her pumping supplies. We changed her flange to a size 27, and we reviewed pumping settings. I recommended that she use the maintenance setting.   I recommended pumping 8 times a day. When baby arrives in her room, I indicated that lactation is available to assist with breast feeding. She verbalized understanding.   Interventions Interventions: Breast feeding basics reviewed;DEBP  Lactation Tools Discussed/Used Pump Review: Setup, frequency, and cleaning   Consult Status Consult Status: Follow-up Date: 02/19/20 Follow-up type: In-patient    Walker Shadow 02/18/2020, 3:05 PM

## 2020-02-18 NOTE — Lactation Note (Signed)
This note was copied from a baby's chart. Lactation Consultation Note  Patient Name: Joyce Green Today's Date: 02/18/2020   During rounding today, the charge nurse indicated that baby Vink may be discharged soon. I asked the RN to call me if Ms. Arias comes to visit today. RN indicated that Ms. Sylva tends to come in during the evening.   Feeding Feeding Type: Breast Milk Nipple Type: Other (Anent level 1)   Walker Shadow 02/18/2020, 9:12 AM

## 2020-02-19 ENCOUNTER — Encounter: Payer: Self-pay | Admitting: Obstetrics and Gynecology

## 2020-02-19 ENCOUNTER — Inpatient Hospital Stay (HOSPITAL_COMMUNITY)
Admission: AD | Admit: 2020-02-19 | Discharge: 2020-02-20 | Disposition: A | Discharge status: Home / Self Care | Payer: Medicaid Other | Attending: Obstetrics and Gynecology | Admitting: Obstetrics and Gynecology

## 2020-02-19 ENCOUNTER — Other Ambulatory Visit: Payer: Self-pay

## 2020-02-19 ENCOUNTER — Encounter (HOSPITAL_COMMUNITY): Payer: Self-pay | Admitting: Obstetrics and Gynecology

## 2020-02-19 DIAGNOSIS — E119 Type 2 diabetes mellitus without complications: Secondary | ICD-10-CM | POA: Diagnosis not present

## 2020-02-19 DIAGNOSIS — O1415 Severe pre-eclampsia, complicating the puerperium: Secondary | ICD-10-CM | POA: Diagnosis not present

## 2020-02-19 DIAGNOSIS — O1093 Unspecified pre-existing hypertension complicating the puerperium: Secondary | ICD-10-CM | POA: Diagnosis not present

## 2020-02-19 DIAGNOSIS — R5383 Other fatigue: Secondary | ICD-10-CM | POA: Diagnosis not present

## 2020-02-19 DIAGNOSIS — Z79899 Other long term (current) drug therapy: Secondary | ICD-10-CM | POA: Insufficient documentation

## 2020-02-19 DIAGNOSIS — Z794 Long term (current) use of insulin: Secondary | ICD-10-CM | POA: Insufficient documentation

## 2020-02-19 DIAGNOSIS — F419 Anxiety disorder, unspecified: Secondary | ICD-10-CM | POA: Insufficient documentation

## 2020-02-19 DIAGNOSIS — R519 Headache, unspecified: Secondary | ICD-10-CM | POA: Diagnosis not present

## 2020-02-19 DIAGNOSIS — Z791 Long term (current) use of non-steroidal anti-inflammatories (NSAID): Secondary | ICD-10-CM | POA: Insufficient documentation

## 2020-02-19 DIAGNOSIS — G44209 Tension-type headache, unspecified, not intractable: Secondary | ICD-10-CM | POA: Diagnosis not present

## 2020-02-19 DIAGNOSIS — O115 Pre-existing hypertension with pre-eclampsia, complicating the puerperium: Secondary | ICD-10-CM | POA: Insufficient documentation

## 2020-02-19 DIAGNOSIS — O2433 Unspecified pre-existing diabetes mellitus in the puerperium: Secondary | ICD-10-CM | POA: Insufficient documentation

## 2020-02-19 DIAGNOSIS — Z833 Family history of diabetes mellitus: Secondary | ICD-10-CM | POA: Insufficient documentation

## 2020-02-19 DIAGNOSIS — Z8249 Family history of ischemic heart disease and other diseases of the circulatory system: Secondary | ICD-10-CM | POA: Diagnosis not present

## 2020-02-19 DIAGNOSIS — Z98891 History of uterine scar from previous surgery: Secondary | ICD-10-CM | POA: Diagnosis not present

## 2020-02-19 DIAGNOSIS — O9089 Other complications of the puerperium, not elsewhere classified: Secondary | ICD-10-CM | POA: Insufficient documentation

## 2020-02-19 DIAGNOSIS — Z87891 Personal history of nicotine dependence: Secondary | ICD-10-CM | POA: Diagnosis not present

## 2020-02-19 DIAGNOSIS — O99355 Diseases of the nervous system complicating the puerperium: Secondary | ICD-10-CM | POA: Diagnosis not present

## 2020-02-19 LAB — MAGNESIUM: Magnesium: 4.3 mg/dL — ABNORMAL HIGH (ref 1.7–2.4)

## 2020-02-19 LAB — COMPREHENSIVE METABOLIC PANEL
ALT: 15 U/L (ref 0–44)
AST: 15 U/L (ref 15–41)
Albumin: 2.5 g/dL — ABNORMAL LOW (ref 3.5–5.0)
Alkaline Phosphatase: 60 U/L (ref 38–126)
Anion gap: 8 (ref 5–15)
BUN: 11 mg/dL (ref 6–20)
CO2: 21 mmol/L — ABNORMAL LOW (ref 22–32)
Calcium: 7.4 mg/dL — ABNORMAL LOW (ref 8.9–10.3)
Chloride: 108 mmol/L (ref 98–111)
Creatinine, Ser: 0.59 mg/dL (ref 0.44–1.00)
GFR calc Af Amer: >60 mL/min (ref >60)
GFR calc non Af Amer: >60 mL/min (ref >60)
Glucose, Bld: 139 mg/dL — ABNORMAL HIGH (ref 70–99)
Potassium: 3.9 mmol/L (ref 3.5–5.1)
Sodium: 137 mmol/L (ref 135–145)
Total Bilirubin: <0.1 mg/dL — ABNORMAL LOW (ref 0.3–1.2)
Total Protein: 5.8 g/dL — ABNORMAL LOW (ref 6.5–8.1)

## 2020-02-19 LAB — GLUCOSE, CAPILLARY
Glucose-Capillary: 113 mg/dL — ABNORMAL HIGH (ref 70–99)
Glucose-Capillary: 132 mg/dL — ABNORMAL HIGH (ref 70–99)

## 2020-02-19 LAB — CBC
HCT: 32.2 % — ABNORMAL LOW (ref 36.0–46.0)
Hemoglobin: 9.7 g/dL — ABNORMAL LOW (ref 12.0–15.0)
MCH: 24.1 pg — ABNORMAL LOW (ref 26.0–34.0)
MCHC: 30.1 g/dL (ref 30.0–36.0)
MCV: 80.1 fL (ref 80.0–100.0)
Platelets: 299 10*3/uL (ref 150–400)
RBC: 4.02 MIL/uL (ref 3.87–5.11)
RDW: 15.6 % — ABNORMAL HIGH (ref 11.5–15.5)
WBC: 6.5 10*3/uL (ref 4.0–10.5)
nRBC: 0 % (ref 0.0–0.2)

## 2020-02-19 MED ORDER — BUTALBITAL-APAP-CAFFEINE 50-325-40 MG PO TABS
1.0000 | ORAL_TABLET | ORAL | Status: DC | PRN
  Administered 2020-02-19: 2 via ORAL
  Administered 2020-02-19: 1 via ORAL
  Filled 2020-02-19: qty 2
  Filled 2020-02-19: qty 1

## 2020-02-19 MED ORDER — BUTALBITAL-APAP-CAFFEINE 50-325-40 MG PO TABS
2.0000 | ORAL_TABLET | Freq: Once | ORAL | Status: AC
  Administered 2020-02-19: 2 via ORAL
  Filled 2020-02-19: qty 2

## 2020-02-19 MED ORDER — ENALAPRIL MALEATE 10 MG PO TABS
20.0000 mg | ORAL_TABLET | Freq: Every day | ORAL | 2 refills | Status: DC
Start: 2020-02-19 — End: 2020-03-21

## 2020-02-19 MED ORDER — FUROSEMIDE 20 MG PO TABS
20.0000 mg | ORAL_TABLET | Freq: Once | ORAL | Status: AC
  Administered 2020-02-19: 20 mg via ORAL
  Filled 2020-02-19: qty 1

## 2020-02-19 MED ORDER — BUTALBITAL-APAP-CAFFEINE 50-325-40 MG PO TABS: 1.0000 | ORAL_TABLET | ORAL | 0 refills | Status: DC | PRN

## 2020-02-19 NOTE — Progress Notes (Signed)
Pt complaining of a headache. RN given verbal order for a one time dose of Fioricet.

## 2020-02-19 NOTE — Discharge Summary (Signed)
Physician Discharge Summary  Patient ID: Joyce Green MRN: [id number] DOB/AGE: [birth year] 34 y.o.  Admit date: 02/18/2020 Discharge date: 02/19/2020  Admission Diagnoses and Discharge Diagnoses:  Principal Problem:   Severe preeclampsia, postpartum Active Problems:   DM (diabetes mellitus), type 2 ([place])   Chronic hypertension with superimposed severe preeclampsia   Cesarean delivery delivered   S/P tubal ligation  Discharged Condition: Stable  Hospital Course: Patient was admitted on POD#7 for severe postpartum preeclampsia superimposed on CHTN, despite being on Lisinopril 10 mg daily.  She has severe headaches and severe BP, required IV hydralazine initially to control her BP.  She received magnesium sulfate for eclampsia prophylaxis x 24 hours, Lisinopril was increased to 20 mg daily for BP control.  Headaches alleviated with Fioricet. She was monitored for over 4 hours after magnesium sulfate and BP was stable. Stable CBGs on DM regimen.  Was deemed stable for discharge to home with outpatient follow up.  Consults: None  Significant Diagnostic Studies:  Results for orders placed or performed during the hospital encounter of 02/18/20 (from the past 72 hour(s))  CBC     Status: Abnormal   Collection Time: 02/18/20 11:33 AM  Result Value Ref Range   WBC 5.7 4.0 - 10.5 K/uL   RBC 4.10 3.87 - 5.11 MIL/uL   Hemoglobin 10.2 (L) 12.0 - 15.0 g/dL   HCT 33.4 (L) 36 - 46 %   MCV 81.5 80.0 - 100.0 fL   MCH 24.9 (L) 26.0 - 34.0 pg   MCHC 30.5 30.0 - 36.0 g/dL   RDW 15.6 (H) 11.5 - 15.5 %   Platelets 282 150 - 400 K/uL   nRBC 0.0 0.0 - 0.2 %    Comment: Performed at [facility], [place] [address], [place], [place] [address]  Comprehensive metabolic panel     Status: Abnormal   Collection Time: 02/18/20 11:33 AM  Result Value Ref Range   Sodium 139 135 - 145 mmol/L   Potassium 3.8 3.5 - 5.1 mmol/L   Chloride 109 98 - 111 mmol/L   CO2 21 (L) 22 - 32 mmol/L   Glucose, Bld 72 70  - 99 mg/dL    Comment: Glucose reference range applies only to samples taken after fasting for at least 8 hours.   BUN 11 6 - 20 mg/dL   Creatinine, Ser 0.60 0.44 - 1.00 mg/dL   Calcium 8.5 (L) 8.9 - 10.3 mg/dL   Total Protein 6.2 (L) 6.5 - 8.1 g/dL   Albumin 2.6 (L) 3.5 - 5.0 g/dL   AST 23 15 - 41 U/L   ALT 17 0 - 44 U/L   Alkaline Phosphatase 66 38 - 126 U/L   Total Bilirubin 0.5 0.3 - 1.2 mg/dL   GFR calc non Af Amer >60 >60 mL/min   GFR calc Af Amer >60 >60 mL/min   Anion gap 9 5 - 15    Comment: Performed at [facility] [address] [place], [place] [address]  Protein / creatinine ratio, urine     Status: Abnormal   Collection Time: 02/18/20 11:33 AM  Result Value Ref Range   Creatinine, Urine 38.93 mg/dL   Total Protein, Urine 10 mg/dL    Comment: NO NORMAL RANGE ESTABLISHED FOR THIS TEST   Protein Creatinine Ratio 0.26 (H) 0.00 - 0.15 mg/mg[Cre]    Comment: Performed at [facility] [address] [place], [place] [address]  Glucose, capillary     Status:  None   Collection Time: 02/18/20  2:20 PM  Result Value Ref Range   Glucose-Capillary 76 70 - 99 mg/dL    Comment: Glucose reference range applies only to samples taken after fasting for at least 8 hours.  Glucose, capillary     Status: None   Collection Time: 02/18/20  9:58 PM  Result Value Ref Range   Glucose-Capillary 80 70 - 99 mg/dL    Comment: Glucose reference range applies only to samples taken after fasting for at least 8 hours.  CBC     Status: Abnormal   Collection Time: 02/19/20  6:39 AM  Result Value Ref Range   WBC 6.5 4.0 - 10.5 K/uL   RBC 4.02 3.87 - 5.11 MIL/uL   Hemoglobin 9.7 (L) 12.0 - 15.0 g/dL   HCT 32.2 (L) 36 - 46 %   MCV 80.1 80.0 - 100.0 fL   MCH 24.1 (L) 26.0 - 34.0 pg   MCHC 30.1 30.0 - 36.0 g/dL   RDW 15.6 (H) 11.5 - 15.5 %   Platelets 299 150 - 400 K/uL   nRBC 0.0 0.0 - 0.2 %    Comment: Performed at [facility] [address], [place], [place] [address]   Comprehensive metabolic panel     Status: Abnormal   Collection Time: 02/19/20  6:39 AM  Result Value Ref Range   Sodium 137 135 - 145 mmol/L   Potassium 3.9 3.5 - 5.1 mmol/L   Chloride 108 98 - 111 mmol/L   CO2 21 (L) 22 - 32 mmol/L   Glucose, Bld 139 (H) 70 - 99 mg/dL    Comment: Glucose reference range applies only to samples taken after fasting for at least 8 hours.   BUN 11 6 - 20 mg/dL   Creatinine, Ser 0.59 0.44 - 1.00 mg/dL   Calcium 7.4 (L) 8.9 - 10.3 mg/dL   Total Protein 5.8 (L) 6.5 - 8.1 g/dL   Albumin 2.5 (L) 3.5 - 5.0 g/dL   AST 15 15 - 41 U/L   ALT 15 0 - 44 U/L   Alkaline Phosphatase 60 38 - 126 U/L   Total Bilirubin <0.1 (L) 0.3 - 1.2 mg/dL   GFR calc non Af Amer >60 >60 mL/min   GFR calc Af Amer >60 >60 mL/min   Anion gap 8 5 - 15    Comment: Performed at [facility] [address] [place], [place] [address]  Magnesium     Status: Abnormal   Collection Time: 02/19/20  6:39 AM  Result Value Ref Range   Magnesium 4.3 (H) 1.7 - 2.4 mg/dL    Comment: Performed at [facility] [address], [place], [place] [address]  Glucose, capillary     Status: Abnormal   Collection Time: 02/19/20 10:15 AM  Result Value Ref Range   Glucose-Capillary 113 (H) 70 - 99 mg/dL    Comment: Glucose reference range applies only to samples taken after fasting for at least 8 hours.  Glucose, capillary     Status: Abnormal   Collection Time: 02/19/20  1:41 PM  Result Value Ref Range   Glucose-Capillary 132 (H) 70 - 99 mg/dL    Comment: Glucose reference range applies only to samples taken after fasting for at least 8 hours.    Discharge Exam: Blood pressure (!) 141/76, pulse 91, temperature 98.5 F (36.9 C), temperature source Oral, resp. rate 18, height 5' 10"  (1.778 m), weight 115 kg, SpO2 99 %,  unknown if currently breastfeeding. Gen: NAD HENT: Normocephalic, atraumatic Lungs: Normal respiratory effort Heart: Regular rate noted Abdomen: NT, soft, incision  healing well Cervix: Deferred Ext: 2+ DTRs, trace BLE edema, no cyanosis, negative Homan's sign  Discharge disposition: 01-Home or Self Care      Future Appointments  Date Time Provider [place]  03/15/2020  8:15 AM Virginia Rochester, NP [facility]  03/15/2020  9:30 AM [facility]  Will be seen next week too.    Allergies as of 02/19/2020   No Known Allergies     Medication List    TAKE these medications   Accu-Chek FastClix Lancets Misc 1 Device by Percutaneous route 4 (four) times daily.   Accu-Chek Nano SmartView w/Device Kit 1 kit by Subdermal route as directed. Check blood sugars for fasting, and two hours after breakfast, lunch and dinner (4 checks daily)   Accu-Chek SmartView test strip Generic drug: glucose blood Use as instructed to check blood sugars   glucose blood test strip Use as instructed   acetaminophen 500 MG tablet Commonly known as: TYLENOL Take 2 tablets (1,000 mg total) by mouth every 6 (six) hours as needed.   butalbital-acetaminophen-caffeine 50-325-40 MG tablet Commonly known as: FIORICET Take 1-2 tablets by mouth every 4 (four) hours as needed for headache.   enalapril 10 MG tablet Commonly known as: VASOTEC Take 2 tablets (20 mg total) by mouth daily. What changed: how much to take   ferrous sulfate 325 (65 FE) MG tablet Take 1 tablet (325 mg total) by mouth daily with breakfast.   hydrocortisone cream 0.5 % Apply 1 application topically 2 (two) times daily as needed (itching/blemishes.).   ibuprofen 800 MG tablet Commonly known as: ADVIL Take 1 tablet (800 mg total) by mouth every 8 (eight) hours as needed.   insulin glargine 100 unit/mL Sopn Commonly known as: LANTUS Inject 0.3 mLs (30 Units total) into the skin at bedtime.   metFORMIN 1000 MG tablet Commonly known as: Glucophage Take 1 tablet (1,000 mg total) by mouth 2 (two) times daily with a meal.   oxyCODONE 5 MG immediate release  tablet Commonly known as: Roxicodone Take 1 tablet (5 mg total) by mouth every 6 (six) hours as needed for severe pain.   PrePLUS 27-1 MG Tabs Take 1 tablet by mouth daily.       Total discharge time: 20 minutes  Signed: Verita Schneiders, MD 02/19/2020, 4:37 PM

## 2020-02-19 NOTE — MAU Note (Signed)
PT SAYS SHE  WAS D/C HOME TODAY AT 5 PM. HAS BAD  H/A NOW- WAS LESS AT D/C. HUSBAND PICKED UP MEDS - DID NOT TAKE ANY

## 2020-02-19 NOTE — Progress Notes (Signed)
Faculty [facility] OB/GYN Attending Note  Subjective:  Reports headaches alleviated on Fioricet.  On magnesium sulfate until 1200 today, she feels this is exacerbating her headaches. Patient denies any visual symptoms, RUQ/epigastric pain or other concerning symptoms. No other concerns.   Admitted on 02/18/2020 for Hypertension in pregnancy, preeclampsia, severe, postpartum condition.    Objective:  Blood pressure 137/73, pulse 90, temperature 98.5 F (36.9 C), temperature source Oral, resp. rate 20, height 5\' 10"  (1.778 m), weight 115 kg, SpO2 99 %, unknown if currently breastfeeding.  Gen: NAD HENT: Normocephalic, atraumatic Lungs: Normal respiratory effort Heart: Regular rate noted Abdomen: NT, soft, incision healing well Cervix: Deferred Ext: 2+ DTRs, 1+ BLE edema, no cyanosis, negative Homan's sign  Assessment & Plan:  34 y.o. 32 POD#8 s/p RLTCS readmitted for postpartum severe preeclampsia superimposed on CHTN. - Continue Lisinopril at increased dosage of 20 mg daily - Lasix 20 mg po x 1 dose given - Will monitor BP off magnesium sulfate after 1200 for at least 4 hours. If stable, may discharge with outpatient follow up. - Continue close observation.  [id number], MD, FACOG Obstetrician & Gynecologist, [facility] for [facility]

## 2020-02-19 NOTE — Discharge Instructions (Signed)
Postpartum Hypertension Postpartum hypertension is high blood pressure that remains higher than normal after childbirth. You may not realize that you have postpartum hypertension if your blood pressure is not being checked regularly. In most cases, postpartum hypertension will go away on its own, usually within a week of delivery. However, for some women, medical treatment is required to prevent serious complications, such as seizures or stroke. What are the causes? This condition may be caused by one or more of the following:  Hypertension that existed before pregnancy (chronic hypertension).  Hypertension that comes on as a result of pregnancy (gestational hypertension).  Hypertensive disorders during pregnancy (preeclampsia) or seizures in women who have high blood pressure during pregnancy (eclampsia).  A condition in which the liver, platelets, and red blood cells are damaged during pregnancy (HELLP syndrome).  A condition in which the thyroid produces too much hormones (hyperthyroidism).  Other rare problems of the nerves (neurological disorders) or blood disorders. In some cases, the cause may not be known. What increases the risk? The following factors may make you more likely to develop this condition:  Chronic hypertension. In some cases, this may not have been diagnosed before pregnancy.  Obesity.  Type 2 diabetes.  Kidney disease.  History of preeclampsia or eclampsia.  Other medical conditions that change the level of hormones in the body (hormonal imbalance). What are the signs or symptoms? As with all types of hypertension, postpartum hypertension may not have any symptoms. Depending on how high your blood pressure is, you may experience:  Headaches. These may be mild, moderate, or severe. They may also be steady, constant, or sudden in onset (thunderclap headache).  Changes in your ability to see (visual changes).  Dizziness.  Shortness of breath.  Swelling  of your hands, feet, lower legs, or face. In some cases, you may have swelling in more than one of these locations.  Heart palpitations or a racing heartbeat.  Difficulty breathing while lying down.  Decrease in the amount of urine that you pass. Other rare signs and symptoms may include:  Sweating more than usual. This lasts longer than a few days after delivery.  Chest pain.  Sudden dizziness when you get up from sitting or lying down.  Seizures.  Nausea or vomiting.  Abdominal pain. How is this diagnosed? This condition may be diagnosed based on the results of a physical exam, blood pressure measurements, and blood and urine tests. You may also have other tests, such as a CT scan or an MRI, to check for other problems of postpartum hypertension. How is this treated? If blood pressure is high enough to require treatment, your options may include:  Medicines to reduce blood pressure (antihypertensives). Tell your health care provider if you are breastfeeding or if you plan to breastfeed. There are many antihypertensive medicines that are safe to take while breastfeeding.  Stopping medicines that may be causing hypertension.  Treating medical conditions that are causing hypertension.  Treating the complications of hypertension, such as seizures, stroke, or kidney problems. Your health care provider will also continue to monitor your blood pressure closely until it is within a safe range for you. Follow these instructions at home:  Take over-the-counter and prescription medicines only as told by your health care provider.  Return to your normal activities as told by your health care provider. Ask your health care provider what activities are safe for you.  Do not use any products that contain nicotine or tobacco, such as cigarettes and e-cigarettes. If   you need help quitting, ask your health care provider.  Keep all follow-up visits as told by your health care provider. This  is important. Contact a health care provider if:  Your symptoms get worse.  You have new symptoms, such as: ? A headache that does not get better. ? Dizziness. ? Visual changes. Get help right away if:  You suddenly develop swelling in your hands, ankles, or face.  You have sudden, rapid weight gain.  You develop difficulty breathing, chest pain, racing heartbeat, or heart palpitations.  You develop severe pain in your abdomen.  You have any symptoms of a stroke. "BE FAST" is an easy way to remember the main warning signs of a stroke: ? B - Balance. Signs are dizziness, sudden trouble walking, or loss of balance. ? E - Eyes. Signs are trouble seeing or a sudden change in vision. ? F - Face. Signs are sudden weakness or numbness of the face, or the face or eyelid drooping on one side. ? A - Arms. Signs are weakness or numbness in an arm. This happens suddenly and usually on one side of the body. ? S - Speech. Signs are sudden trouble speaking, slurred speech, or trouble understanding what people say. ? T - Time. Time to call emergency services. Write down what time symptoms started.  You have other signs of a stroke, such as: ? A sudden, severe headache with no known cause. ? Nausea or vomiting. ? Seizure. These symptoms may represent a serious problem that is an emergency. Do not wait to see if the symptoms will go away. Get medical help right away. Call your local emergency services (911 in the [place] Do not drive yourself to the hospital. Summary  Postpartum hypertension is high blood pressure that remains higher than normal after childbirth.  In most cases, postpartum hypertension will go away on its own, usually within a week of delivery.  For some women, medical treatment is required to prevent serious complications, such as seizures or stroke. This information is not intended to replace advice given to you by your health care provider. Make sure you discuss any questions  you have with your health care provider. Document Revised: 07/04/2018 Document Reviewed: 03/18/2017 Elsevier Patient Education  2020 [employer]  

## 2020-02-20 DIAGNOSIS — G44209 Tension-type headache, unspecified, not intractable: Secondary | ICD-10-CM

## 2020-02-20 DIAGNOSIS — O99355 Diseases of the nervous system complicating the puerperium: Secondary | ICD-10-CM

## 2020-02-20 LAB — URINALYSIS, ROUTINE W REFLEX MICROSCOPIC
Bacteria, UA: NONE SEEN
Bilirubin Urine: NEGATIVE
Glucose, UA: NEGATIVE mg/dL
Ketones, ur: NEGATIVE mg/dL
Nitrite: NEGATIVE
Protein, ur: NEGATIVE mg/dL
Specific Gravity, Urine: 1.016 (ref 1.005–1.030)
pH: 6 (ref 5.0–8.0)

## 2020-02-20 MED ORDER — ENALAPRIL MALEATE 20 MG PO TABS
20.0000 mg | ORAL_TABLET | Freq: Once | ORAL | Status: AC
  Administered 2020-02-20: 20 mg via ORAL
  Filled 2020-02-20: qty 1

## 2020-02-20 NOTE — Discharge Instructions (Signed)

## 2020-02-20 NOTE — MAU Provider Note (Signed)
History     CSN: [id number]  Arrival date and time: 02/19/20 2242   First Provider Initiated Contact with Patient 02/20/20 0048      No chief complaint on file.  Joyce Green is a 34 y.o. [phone] at 9 days PP who receives care at [facility].  She presents today for headache.  She states she was discharged today after PP PreEclampsia treatment.  She states she went home and took a nap and woke up with a headache. Patient states that she immediately became concerned and had her husband bring her back to the hospital.  Patient was given Fioricet upon arrival to the unit and reports her HA has resolved. Patient expresses concern with elevated blood pressure and stroke risk.    OB History    Gravida  4   Para  4   Term  3   Preterm  1   AB  0   Living  3     SAB  0   TAB  0   Ectopic  0   Multiple  0   Live Births  3           Past Medical History:  Diagnosis Date  . Bacteremia 03/2018   approximately 10 days after vbac  . Complication of anesthesia    intense itching after CS  . Diabetes mellitus without complication ([place])    T2  . Hypertension   . Palpitations     Past Surgical History:  Procedure Laterality Date  . CESAREAN SECTION N/A 02/20/2014   Procedure: CESAREAN SECTION;  Surgeon: Florian Buff, MD;  Location: [place] [facility];  Service: Obstetrics;  Laterality: N/A;  . CESAREAN SECTION N/A 07/28/2016   Procedure: CESAREAN SECTION;  Surgeon: Mora Bellman, MD;  Location: [place];  Service: Obstetrics;  Laterality: N/A;  . CESAREAN SECTION WITH BILATERAL TUBAL LIGATION Bilateral 02/11/2020   Procedure: CESAREAN SECTION WITH BILATERAL TUBAL LIGATION;  Surgeon: Mora Bellman, MD;  Location: [place] [facility];  Service: Obstetrics;  Laterality: Bilateral;    Family History  Problem Relation Age of Onset  . Diabetes Mellitus I Mother   . Hypertension Mother   . Diabetes Mother   . Hypertension Father   . Lung cancer Father   . Cancer Maternal Grandmother    . Cancer Maternal Grandfather   . Cancer Paternal Grandmother   . Cancer Paternal Grandfather   . Early death Neg Hx   . Heart disease Neg Hx   . Hyperlipidemia Neg Hx   . Kidney disease Neg Hx   . Stroke Neg Hx     Social History   Tobacco Use  . Smoking status: Former Smoker    Packs/day: 0.00    Years: 0.00    Pack years: 0.00    Types: Cigars    Quit date: 06/11/2009    Years since quitting: 10.7  . Smokeless tobacco: Never Used  . Tobacco comment: not while pregnant  Vaping Use  . Vaping Use: Never used  Substance Use Topics  . Alcohol use: Not Currently    Alcohol/week: 4.0 standard drinks    Types: 2 Glasses of wine, 2 Cans of beer per week    Comment: occasional; stopped after found out she was pregnant  . Drug use: Not Currently    Types: Marijuana    Comment: per prenatal record, +THC on initial visit u/a; no current use    Allergies: No Known Allergies  Medications Prior to Admission  Medication  Sig Dispense Refill Last Dose  . ACCU-CHEK FASTCLIX LANCETS MISC 1 Device by Percutaneous route 4 (four) times daily. 100 each 12   . acetaminophen (TYLENOL) 500 MG tablet Take 2 tablets (1,000 mg total) by mouth every 6 (six) hours as needed. 30 tablet 0   . Blood Glucose Monitoring Suppl (ACCU-CHEK NANO SMARTVIEW) w/Device KIT 1 kit by Subdermal route as directed. Check blood sugars for fasting, and two hours after breakfast, lunch and dinner (4 checks daily) 1 kit 0   . butalbital-acetaminophen-caffeine (FIORICET) 50-325-40 MG tablet Take 1-2 tablets by mouth every 4 (four) hours as needed for headache. 30 tablet 0 NONE  . enalapril (VASOTEC) 10 MG tablet Take 2 tablets (20 mg total) by mouth daily. 30 tablet 2   . ferrous sulfate 325 (65 FE) MG tablet Take 1 tablet (325 mg total) by mouth daily with breakfast. 30 tablet 0   . glucose blood (ACCU-CHEK SMARTVIEW) test strip Use as instructed to check blood sugars 120 each 12   . glucose blood test strip Use as  instructed 100 each 12   . hydrocortisone cream 0.5 % Apply 1 application topically 2 (two) times daily as needed (itching/blemishes.).     Marland Kitchen ibuprofen (ADVIL) 800 MG tablet Take 1 tablet (800 mg total) by mouth every 8 (eight) hours as needed. 30 tablet 0   . insulin glargine (LANTUS) 100 unit/mL SOPN Inject 0.3 mLs (30 Units total) into the skin at bedtime. 9 mL 0   . metFORMIN (GLUCOPHAGE) 1000 MG tablet Take 1 tablet (1,000 mg total) by mouth 2 (two) times daily with a meal. 90 tablet 2   . oxyCODONE (ROXICODONE) 5 MG immediate release tablet Take 1 tablet (5 mg total) by mouth every 6 (six) hours as needed for severe pain. 20 tablet 0   . Prenatal Vit-Fe Fumarate-FA (PREPLUS) 27-1 MG TABS Take 1 tablet by mouth daily. 30 tablet 13     Review of Systems  Constitutional: Negative for chills and fever.  Respiratory: Negative for cough and shortness of breath.   Gastrointestinal: Negative for nausea and vomiting.  Genitourinary: Positive for vaginal bleeding. Negative for difficulty urinating, dysuria and vaginal discharge.  Neurological: Positive for headaches. Negative for dizziness and light-headedness.   Physical Exam   Blood pressure (!) 156/93, pulse 87, temperature 98.4 F (36.9 C), temperature source Oral, resp. rate 18, unknown if currently breastfeeding. Vitals:   02/19/20 2326 02/19/20 2346  BP: (!) 153/90 (!) 156/93  Pulse: 86 87  Resp: 18   Temp: 98.4 F (36.9 C)   TempSrc: Oral    Vitals:   02/19/20 2346 02/20/20 0152  BP: (!) 156/93 135/72  Pulse: 87 77  Resp:    Temp:       Physical Exam Constitutional:      General: She is not in acute distress.    Appearance: Normal appearance. She is well-developed.  HENT:     Head: Normocephalic and atraumatic.  Eyes:     Conjunctiva/sclera: Conjunctivae normal.  Cardiovascular:     Rate and Rhythm: Normal rate and regular rhythm.  Pulmonary:     Effort: Pulmonary effort is normal.  Skin:    General: Skin is  warm and dry.  Neurological:     Mental Status: She is alert and oriented to person, place, and time.  Psychiatric:        Mood and Affect: Mood is anxious.        Behavior: Behavior normal.  MAU Course  Procedures Results for orders placed or performed during the hospital encounter of 02/19/20 (from the past 24 hour(s))  Urinalysis, Routine w reflex microscopic Urine, Clean Catch     Status: Abnormal   Collection Time: 02/19/20 11:32 PM  Result Value Ref Range   Color, Urine YELLOW YELLOW   APPearance CLEAR CLEAR   Specific Gravity, Urine 1.016 1.005 - 1.030   pH 6.0 5.0 - 8.0   Glucose, UA NEGATIVE NEGATIVE mg/dL   Hgb urine dipstick LARGE (A) NEGATIVE   Bilirubin Urine NEGATIVE NEGATIVE   Ketones, ur NEGATIVE NEGATIVE mg/dL   Protein, ur NEGATIVE NEGATIVE mg/dL   Nitrite NEGATIVE NEGATIVE   Leukocytes,Ua TRACE (A) NEGATIVE   RBC / HPF 0-5 0 - 5 RBC/hpf   WBC, UA 0-5 0 - 5 WBC/hpf   Bacteria, UA NONE SEEN NONE SEEN   Squamous Epithelial / LPF 0-5 0 - 5   Mucus PRESENT     MDM Education Pain Medication Antihypertensive  Assessment and Plan  34 year old 9 Days Postpartum Headache S/P MgSO4 Infusion  -Extensive discussion with patient regarding blood pressures and management. -Instructed to not take blood pressure when in pain or feeling overly anxious. -Reassured that since MgSO4 has been given risk for seizure has been decreased. -Educated on how medications, including antihypertensives, can cause headaches. -Encouraged increased self-awareness regarding self-care.  Discussed evaluating eating patterns, hydration status, and rest before assuming HA and other symptoms related to underlying morbidity. -Patient verbalizes understanding and expresses feelings of anxiety and fatigue. -Comfort given. -Informed of need to take medication as prescribed. -Will give Vasotec 13m now -Instructed to not take dosage for Saturday 9/11 and resume on Sunday 9/12. -Patient  without further questions or concerns. -Encouraged to call or return to MAU if symptoms worsen or with the onset of new symptoms. -Discharged to home in stable condition.  JMaryann Conners9/04/2020, 12:48 AM

## 2020-02-24 ENCOUNTER — Encounter: Payer: Self-pay | Admitting: Obstetrics and Gynecology

## 2020-02-25 ENCOUNTER — Other Ambulatory Visit: Payer: Self-pay

## 2020-02-25 ENCOUNTER — Ambulatory Visit (INDEPENDENT_AMBULATORY_CARE_PROVIDER_SITE_OTHER): Payer: Medicaid Other

## 2020-02-25 DIAGNOSIS — I10 Essential (primary) hypertension: Secondary | ICD-10-CM

## 2020-02-25 MED ORDER — NIFEDIPINE ER OSMOTIC RELEASE 30 MG PO TB24: 30.0000 mg | ORAL_TABLET | Freq: Every day | ORAL | 0 refills | Status: DC

## 2020-02-25 NOTE — Progress Notes (Addendum)
Patient here for blood pressure check following c-section on 02/11/20. Pt was admitted for PP severe preeclampsia on 02/18/20. Pt discharged home taking enalapril 20 mg daily. BP today is 155/97, HR 92. Rechecked later during visit: 152/97, HR 89. Patient endorses headache; denies other s/s. Describes headache as 2-3/10, this is mild according to pt. Took ibuprofen prior to leaving home. Reviewed with Donavan Foil, MD who recommends pt begin Procardia XL 30 mg daily and return for BP check in 1 week. Verbal order given for pre-e labs today. S/s of elevated BP and normal BP limits reviewed with pt.     Incision assessed by Donavan Foil, MD who finds incision to be well healing. Pt reports pain to right side of incision near to open area. Does not recommend any intervention at this time. Recommended pt use small piece of guaze to cover open area on right side of incision and continue current pain medications.   Pt inquires about safety of fioricet while breastfeeding. See MyChart communication.  Fleet Contras RN 02/25/20  Addend: Pt reported some constipation since discharge. Encouraged pt to begin otc Colace BID and to supplement with Miralax as needed. Encouraged pt to increase water intake. Explained constipation following c-section can cause pain and other complications.

## 2020-02-25 NOTE — Progress Notes (Signed)
Patient was assessed and managed by nursing staff during this encounter. I have reviewed the chart and agree with the documentation and plan. I have also made any necessary editorial changes.  Teisha Trowbridge A Tonnie Stillman, MD 02/25/2020 5:26 PM   

## 2020-02-26 LAB — PROTEIN / CREATININE RATIO, URINE
Creatinine, Urine: 130.8 mg/dL
Protein, Ur: 26.1 mg/dL
Protein/Creat Ratio: 200 mg/g creat (ref 0–200)

## 2020-02-26 LAB — COMPREHENSIVE METABOLIC PANEL
ALT: 10 IU/L (ref 0–32)
AST: 12 IU/L (ref 0–40)
Albumin/Globulin Ratio: 1.4 (ref 1.2–2.2)
Albumin: 3.6 g/dL — ABNORMAL LOW (ref 3.8–4.8)
Alkaline Phosphatase: 92 IU/L (ref 44–121)
BUN/Creatinine Ratio: 18 (ref 9–23)
BUN: 11 mg/dL (ref 6–20)
Bilirubin Total: <0.2 mg/dL (ref 0.0–1.2)
CO2: 22 mmol/L (ref 20–29)
Calcium: 8.5 mg/dL — ABNORMAL LOW (ref 8.7–10.2)
Chloride: 107 mmol/L — ABNORMAL HIGH (ref 96–106)
Creatinine, Ser: 0.6 mg/dL (ref 0.57–1.00)
GFR calc Af Amer: 139 mL/min/{1.73_m2} (ref >59)
GFR calc non Af Amer: 120 mL/min/{1.73_m2} (ref >59)
Globulin, Total: 2.6 g/dL (ref 1.5–4.5)
Glucose: 75 mg/dL (ref 65–99)
Potassium: 4.2 mmol/L (ref 3.5–5.2)
Sodium: 142 mmol/L (ref 134–144)
Total Protein: 6.2 g/dL (ref 6.0–8.5)

## 2020-02-26 LAB — CBC
Hematocrit: 34.6 % (ref 34.0–46.6)
Hemoglobin: 10.9 g/dL — ABNORMAL LOW (ref 11.1–15.9)
MCH: 24.7 pg — ABNORMAL LOW (ref 26.6–33.0)
MCHC: 31.5 g/dL (ref 31.5–35.7)
MCV: 79 fL (ref 79–97)
Platelets: 259 10*3/uL (ref 150–450)
RBC: 4.41 x10E6/uL (ref 3.77–5.28)
RDW: 14.6 % (ref 11.7–15.4)
WBC: 4.7 10*3/uL (ref 3.4–10.8)

## 2020-03-02 ENCOUNTER — Encounter: Payer: Self-pay | Admitting: Obstetrics and Gynecology

## 2020-03-03 ENCOUNTER — Ambulatory Visit (INDEPENDENT_AMBULATORY_CARE_PROVIDER_SITE_OTHER): Payer: Medicaid Other | Admitting: Lactation Services

## 2020-03-03 ENCOUNTER — Other Ambulatory Visit: Payer: Self-pay

## 2020-03-03 ENCOUNTER — Encounter: Payer: Self-pay | Admitting: Lactation Services

## 2020-03-03 VITALS — BP 150/94 | HR 100 | Ht 70.0 in | Wt 238.3 lb

## 2020-03-03 DIAGNOSIS — Z0130 Encounter for examination of blood pressure without abnormal findings: Secondary | ICD-10-CM

## 2020-03-03 MED ORDER — BUTALBITAL-APAP-CAFFEINE 50-325-40 MG PO TABS: 1.0000 | ORAL_TABLET | ORAL | 0 refills | Status: DC | PRN

## 2020-03-03 MED ORDER — NIFEDIPINE ER OSMOTIC RELEASE 60 MG PO TB24: 60.0000 mg | ORAL_TABLET | Freq: Every day | ORAL | 2 refills | Status: DC

## 2020-03-03 NOTE — Progress Notes (Signed)
Patient in for BP check. Patient with history of Pre eclampsia. Initial BP 150/94 and repeat 143/93.   Patient reports headaches the last 2 days and has taken 1-2 Fioricets for HA. The headaches go away with Fioricet and helps her sleep. It goes away within a few hours.   Fioricet re-ordered. Advised patient to try Ibuprofen first and then if not work take [employer].   Patient with no swelling, blurred vision or dizziness.   Reviewed BP with Dr. Debroah Loop. Procardia increased. Patient to follow up in 1 week for BP check

## 2020-03-04 ENCOUNTER — Other Ambulatory Visit: Payer: Self-pay | Admitting: Obstetrics & Gynecology

## 2020-03-10 ENCOUNTER — Ambulatory Visit (INDEPENDENT_AMBULATORY_CARE_PROVIDER_SITE_OTHER): Payer: Medicaid Other | Admitting: Lactation Services

## 2020-03-10 ENCOUNTER — Other Ambulatory Visit: Payer: Self-pay

## 2020-03-10 ENCOUNTER — Encounter: Payer: Self-pay | Admitting: Lactation Services

## 2020-03-10 VITALS — BP 142/94 | HR 86 | Ht 70.0 in | Wt 239.0 lb

## 2020-03-10 DIAGNOSIS — O119 Pre-existing hypertension with pre-eclampsia, unspecified trimester: Secondary | ICD-10-CM

## 2020-03-10 DIAGNOSIS — Z0130 Encounter for examination of blood pressure without abnormal findings: Secondary | ICD-10-CM

## 2020-03-10 MED ORDER — BUTALBITAL-APAP-CAFFEINE 50-325-40 MG PO TABS: 1.0000 | ORAL_TABLET | ORAL | 0 refills | Status: DC | PRN

## 2020-03-10 NOTE — Progress Notes (Signed)
Agree with A & P. 

## 2020-03-10 NOTE — Progress Notes (Signed)
Patient back for BP check. Initial BP 149/103. Repeat 142/94 HR 86. Reported to Dr. Alysia Penna. Plan is to not make changes to medications and follow up at [facility] appt on 10/5.   Patient continues with HA, much better than it was. She has taken Fioricet 2 x in the last week.   She has increased Procardia to 60 ml since last week.   Patient reports she is not able to get her Fioricet filled as needs to be called in. Called in to Pharmacy from prescription sent on 9/23.

## 2020-03-15 ENCOUNTER — Other Ambulatory Visit: Payer: Self-pay

## 2020-03-15 ENCOUNTER — Other Ambulatory Visit (HOSPITAL_COMMUNITY)
Admission: RE | Admit: 2020-03-15 | Discharge: 2020-03-15 | Disposition: A | Discharge status: Home / Self Care | Payer: Medicaid Other | Source: Ambulatory Visit | Attending: Nurse Practitioner | Admitting: Nurse Practitioner

## 2020-03-15 ENCOUNTER — Ambulatory Visit (INDEPENDENT_AMBULATORY_CARE_PROVIDER_SITE_OTHER): Payer: Medicaid Other | Admitting: Nurse Practitioner

## 2020-03-15 ENCOUNTER — Other Ambulatory Visit: Payer: Medicaid Other

## 2020-03-15 ENCOUNTER — Encounter: Payer: Self-pay | Admitting: Family Medicine

## 2020-03-15 ENCOUNTER — Encounter: Payer: Self-pay | Admitting: Nurse Practitioner

## 2020-03-15 DIAGNOSIS — O2413 Pre-existing diabetes mellitus, type 2, in the puerperium: Secondary | ICD-10-CM

## 2020-03-15 DIAGNOSIS — Z98891 History of uterine scar from previous surgery: Secondary | ICD-10-CM

## 2020-03-15 DIAGNOSIS — Z9851 Tubal ligation status: Secondary | ICD-10-CM

## 2020-03-15 DIAGNOSIS — E1169 Type 2 diabetes mellitus with other specified complication: Secondary | ICD-10-CM

## 2020-03-15 DIAGNOSIS — Z8759 Personal history of other complications of pregnancy, childbirth and the puerperium: Secondary | ICD-10-CM

## 2020-03-15 DIAGNOSIS — O119 Pre-existing hypertension with pre-eclampsia, unspecified trimester: Secondary | ICD-10-CM

## 2020-03-15 DIAGNOSIS — Z794 Long term (current) use of insulin: Secondary | ICD-10-CM

## 2020-03-15 DIAGNOSIS — Z6834 Body mass index (BMI) 34.0-34.9, adult: Secondary | ICD-10-CM

## 2020-03-15 DIAGNOSIS — O1093 Unspecified pre-existing hypertension complicating the puerperium: Secondary | ICD-10-CM

## 2020-03-15 NOTE — Assessment & Plan Note (Deleted)
Had postpartum hospital readmission with magnesium sulfate infusion

## 2020-03-15 NOTE — Progress Notes (Signed)
    Post Partum Visit Note  Joyce Green is a 34 y.o. [phone] female who presents for a postpartum visit. She is 4 weeks postpartum following a repeat cesarean section and S/P bilateral tubal ligation.  I have fully reviewed the prenatal and intrapartum course - pregnancy was complicated by preexisting type 2 diabetes on metformin and insulin and chronic hypertension on medication  The delivery was at 37/1 gestational weeks.  Anesthesia: spinal. Postpartum course has been complicated by postpartum readmission for severe preeclampsia postpartum requiring IV hydralazine to control BP and magnesium sulfate was given for eclampsia prevention.Joyce Green is doing well. Baby is feeding by both breast and bottle - Carnation Good Start Soy. Bleeding thin lochia. Bowel function is normal. Bladder function is normal. Patient is not sexually active. Contraception method is tubal ligation. Postpartum depression screening: negative.   The following portions of the patient's history were reviewed and updated as appropriate: allergies, current medications, past family history, past medical history, past social history, past surgical history and problem list.  Review of Systems Pertinent items noted in HPI and remainder of comprehensive ROS otherwise negative.    Objective:  currently breastfeeding.  General:  alert, cooperative and no distress   Breasts:  deferred  Lungs: clear to auscultation bilaterally  Heart:  regular rate and rhythm, S1, S2 normal, no murmur, click, rub or gallop  Abdomen: soft, non-tender; bowel sounds normal; no masses,  no organomegaly   Vulva:  normal  Vagina: normal vagina  Cervix:  multiparous appearance  Corpus: not examined  Adnexa:  not evaluated  Rectal Exam: Not performed.        Assessment:    Complicated postpartum exam with hypertension continuing even with medication however not severe. Pap smear done at today's visit.   Plan:   Essential components of care per  ACOG recommendations:  1.  Mood and well being: Patient with negative depression screening today. Reviewed local resources for support.  - Patient does not use tobacco. - hx of drug use? No   2. Infant care and feeding:  -Patient currently breastmilk feeding? Yes If breastmilk feeding discussed return to work and pumping. If needed, patient was provided letter for work to allow for every 2-3 hr pumping breaks, and to be granted a private location to express breastmilk and refrigerated area to store breastmilk. Reviewed importance of draining breast regularly to support lactation.  Letter for work requested from [employer]. -Social determinants of health (SDOH) reviewed in EPIC. No concerns - 3. Sexuality, contraception and birth spacing - Tubal ligation completed  4. Sleep and fatigue -Encouraged family/partner/community support of 4 hrs of uninterrupted sleep to help with mood and fatigue  5. Physical Recovery  - Discussed patients delivery and complications - Patient has urinary incontinence? No  - Patient is safe to resume physical and sexual activity  6.  Health Maintenance - Last pap smear done in 2018 and was normal  Done today   7. Chronic Disease - PCP follow up for continued management of BP and Type 2 diabetes. Continue taking BP meds as you are doing.  Try to see your doctor within 3 weeks. Return to this office for GYN problems as needed.  Henrietta Dine, CMA [facility] [employer], [facility]  Nolene Bernheim, RN, MSN, NP-BC Nurse Practitioner, [facility] for [employer], [facility] 03/15/2020 11:53 AM

## 2020-03-17 LAB — CYTOLOGY - PAP
Comment: NEGATIVE
Diagnosis: NEGATIVE
High risk HPV: NEGATIVE

## 2020-03-18 ENCOUNTER — Other Ambulatory Visit: Payer: Self-pay | Admitting: Obstetrics and Gynecology

## 2020-03-18 ENCOUNTER — Telehealth: Payer: Self-pay | Admitting: Lactation Services

## 2020-03-18 DIAGNOSIS — I10 Essential (primary) hypertension: Secondary | ICD-10-CM

## 2020-03-18 NOTE — Telephone Encounter (Signed)
Patient was called to let her know Procardia has been refilled and that she needs to follow up with PCP for further refills.   Patient did not answer and mailbox full. Will send My Chart message.

## 2020-03-18 NOTE — Telephone Encounter (Signed)
-----   Message from Warden Fillers, MD sent at 03/18/2020 10:38 AM EDT ----- Regarding: procardia refill I will refill the procardia for now, but pt needs to establish care with a PCP (family practice or internal medicine) for continued surveillance and management of hypertension

## 2020-03-21 ENCOUNTER — Other Ambulatory Visit: Payer: Self-pay

## 2020-03-21 DIAGNOSIS — I10 Essential (primary) hypertension: Secondary | ICD-10-CM

## 2020-03-21 MED ORDER — NIFEDIPINE ER OSMOTIC RELEASE 30 MG PO TB24: 30.0000 mg | ORAL_TABLET | Freq: Every day | ORAL | 1 refills | Status: DC

## 2020-03-21 MED ORDER — ENALAPRIL MALEATE 10 MG PO TABS
20.0000 mg | ORAL_TABLET | Freq: Every day | ORAL | 0 refills | Status: DC
Start: 2020-03-21 — End: 2020-04-26

## 2020-03-24 DIAGNOSIS — B351 Tinea unguium: Secondary | ICD-10-CM | POA: Diagnosis not present

## 2020-03-24 DIAGNOSIS — M79674 Pain in right toe(s): Secondary | ICD-10-CM | POA: Diagnosis not present

## 2020-03-24 DIAGNOSIS — E119 Type 2 diabetes mellitus without complications: Secondary | ICD-10-CM | POA: Diagnosis not present

## 2020-03-26 ENCOUNTER — Other Ambulatory Visit: Payer: Self-pay | Admitting: Obstetrics & Gynecology

## 2020-04-12 ENCOUNTER — Other Ambulatory Visit: Payer: Self-pay | Admitting: Obstetrics and Gynecology

## 2020-04-25 ENCOUNTER — Other Ambulatory Visit: Payer: Self-pay | Admitting: Obstetrics and Gynecology

## 2020-04-25 NOTE — Progress Notes (Addendum)
Cardiology Office Note:    Date:  04/27/2020   ID:  Octaviano Batty, DOB May 02, 1986, MRN [id number]  PCP:  Simona Huh, NP  Cardiologist:  Donato Heinz, MD  Electrophysiologist:  None   Referring MD: Simona Huh, NP   Chief Complaint  Patient presents with  . Hypertension    History of Present Illness:    Joyce Green is a 34 y.o. female with a hx of hypertension, type 2 diabetes who presents for follow-up.  She was referred by Dr. Ilda Basset for evaluation of hypertension.  She was [redacted] weeks pregnant at first office visit in 09/2019.  She previously had seen a cardiologist at [facility].  Had episode of chest pain which she was seen in the ED 04/2019.  Reports had TTE done last year. TTE 11/18/2012 showed normal LVEF, mild LVH.  Underwent nuclear stress test 11/12/2018 that showed partially reversible anterior wall defect.  CTPA on 05/29/2018 showed no PE but did note borderline prominent main pulmonary outflow tract concerning for underlying pulmonary hypertension.  She currently denies any recent chest pain.  Reports during first trimester having significant dyspnea.  However that has improved, currently no shortness of breath.  She was prescribed last week nifedipine by her obstetrician, but has not started taking it yet.  Reports having palpitations last year but none in the last 3 to 4 months.  Reports that she smokes marijuana 2-3 times per year.  No cigarettes.  No family history of heart disease in her immediate family  TTE on 10/22/2018 showed LVEF 60 to 65%, normal RV function, mild LVH.  Since last clinic visit, she underwent uncomplicated delivery via cesarean section on 02/11/2020.  She subsequently was admitted on postop day 7 for severe postpartum preeclampsia superimposed on chronic hypertension.  She was discharged on enalapril 20 mg daily and nifedipine 30 mg daily.  Has not been checking BP at home.   Denies any chest pain or dyspnea.  Denies any  lightheadedness, syncope, or palpitations.  She started Zumba for exercise, has been going twice per week for 45 minutes.   Past Medical History:  Diagnosis Date  . Bacteremia 03/2018   approximately 10 days after vbac  . Complication of anesthesia    intense itching after CS  . Diabetes mellitus without complication ([place])    T2  . Hypertension   . Palpitations     Past Surgical History:  Procedure Laterality Date  . CESAREAN SECTION N/A 02/20/2014   Procedure: CESAREAN SECTION;  Surgeon: Florian Buff, MD;  Location: [place] [facility];  Service: Obstetrics;  Laterality: N/A;  . CESAREAN SECTION N/A 07/28/2016   Procedure: CESAREAN SECTION;  Surgeon: Mora Bellman, MD;  Location: [place];  Service: Obstetrics;  Laterality: N/A;  . CESAREAN SECTION WITH BILATERAL TUBAL LIGATION Bilateral 02/11/2020   Procedure: CESAREAN SECTION WITH BILATERAL TUBAL LIGATION;  Surgeon: Mora Bellman, MD;  Location: [place] [facility];  Service: Obstetrics;  Laterality: Bilateral;    Current Medications: Current Meds  Medication Sig  . ACCU-CHEK FASTCLIX LANCETS MISC 1 Device by Percutaneous route 4 (four) times daily.  Marland Kitchen acetaminophen (TYLENOL) 500 MG tablet Take 2 tablets (1,000 mg total) by mouth every 6 (six) hours as needed.  . Blood Glucose Monitoring Suppl (ACCU-CHEK NANO SMARTVIEW) w/Device KIT 1 kit by Subdermal route as directed. Check blood sugars for fasting, and two hours after breakfast, lunch and dinner (4 checks daily)  . butalbital-acetaminophen-caffeine (FIORICET) 50-325-40 MG tablet Take 1-2 tablets  by mouth every 4 (four) hours as needed for headache.  . enalapril (VASOTEC) 20 MG tablet Take 20 mg by mouth daily.  Marland Kitchen glucose blood (ACCU-CHEK SMARTVIEW) test strip Use as instructed to check blood sugars  . glucose blood test strip Use as instructed  . hydrocortisone cream 0.5 % Apply 1 application topically 2 (two) times daily as needed (itching/blemishes.).  Marland Kitchen metFORMIN (GLUCOPHAGE) 1000 MG  tablet Take 1 tablet (1,000 mg total) by mouth 2 (two) times daily with a meal.  . NIFEdipine (PROCARDIA XL/NIFEDICAL XL) 60 MG 24 hr tablet Take 60 mg by mouth daily.  . pravastatin (PRAVACHOL) 10 MG tablet Take 10 mg by mouth at bedtime.  . Prenatal Vit-Fe Fumarate-FA (PREPLUS) 27-1 MG TABS Take 1 tablet by mouth daily.     Allergies:   Patient has no known allergies.   Social History   Socioeconomic History  . Marital status: Married    Spouse name: Not on file  . Number of children: Not on file  . Years of education: Not on file  . Highest education level: Not on file  Occupational History  . Not on file  Tobacco Use  . Smoking status: Former Smoker    Packs/day: 0.00    Years: 0.00    Pack years: 0.00    Types: Cigars    Quit date: 06/11/2009    Years since quitting: 10.8  . Smokeless tobacco: Never Used  . Tobacco comment: not while pregnant  Vaping Use  . Vaping Use: Never used  Substance and Sexual Activity  . Alcohol use: Not Currently    Alcohol/week: 4.0 standard drinks    Types: 2 Glasses of wine, 2 Cans of beer per week    Comment: occasional; stopped after found out she was pregnant  . Drug use: Not Currently    Types: Marijuana    Comment: per prenatal record, +THC on initial visit u/a; no current use  . Sexual activity: Yes    Partners: Male    Birth control/protection: None  Other Topics Concern  . Not on file  Social History Narrative  . Not on file   Social Determinants of Health   Financial Resource Strain:   . Difficulty of Paying Living Expenses: Not on file  Food Insecurity: No Food Insecurity  . Worried About Charity fundraiser in the Last Year: Never true  . Ran Out of Food in the Last Year: Never true  Transportation Needs: No Transportation Needs  . Lack of Transportation (Medical): No  . Lack of Transportation (Non-Medical): No  Physical Activity:   . Days of Exercise per Week: Not on file  . Minutes of Exercise per Session: Not on  file  Stress:   . Feeling of Stress : Not on file  Social Connections:   . Frequency of Communication with Friends and Family: Not on file  . Frequency of Social Gatherings with Friends and Family: Not on file  . Attends Religious Services: Not on file  . Active Member of Clubs or Organizations: Not on file  . Attends Archivist Meetings: Not on file  . Marital Status: Not on file     Family History: The patient's family history includes Cancer in her maternal grandfather, maternal grandmother, paternal grandfather, and paternal grandmother; Diabetes in her mother; Diabetes Mellitus I in her mother; Hypertension in her father and mother; Lung cancer in her father. There is no history of Early death, Heart disease, Hyperlipidemia, Kidney disease, or  Stroke.  ROS:   Please see the history of present illness.    All other systems reviewed and are negative.  EKGs/Labs/Other Studies Reviewed:    The following studies were reviewed today:   EKG:  EKG is  ordered today.  The ekg ordered today demonstrates sinus rhythm, rate 96, no ST/T wave abnormalities  Recent Labs: 08/26/2019: TSH 2.110 02/19/2020: Magnesium 4.3 02/25/2020: ALT 10; BUN 11; Creatinine, Ser 0.60; Hemoglobin 10.9; Platelets 259; Potassium 4.2; Sodium 142  Recent Lipid Panel    Component Value Date/Time   CHOL 156 11/06/2017 1510   TRIG 104 11/06/2017 1510   HDL 49 11/06/2017 1510   CHOLHDL 3.2 11/06/2017 1510   CHOLHDL 3 01/07/2013 1558   VLDL 8.6 01/07/2013 1558   LDLCALC 86 11/06/2017 1510    Physical Exam:    VS:  BP 128/80   Pulse 96   Ht 5' 10"  (1.778 m)   Wt 239 lb (108.4 kg)   BMI 34.29 kg/m     Wt Readings from Last 3 Encounters:  04/26/20 239 lb (108.4 kg)  03/15/20 242 lb 1.6 oz (109.8 kg)  03/10/20 239 lb (108.4 kg)     GEN:   in no acute distress HEENT: Normal NECK: No JVD LYMPHATICS: No lymphadenopathy CARDIAC: RRR, no murmurs, rubs, gallops RESPIRATORY:  Clear to  auscultation without rales, wheezing or rhonchi  ABDOMEN: Soft, non-tender, non-distended MUSCULOSKELETAL:  No edema SKIN: Warm and dry NEUROLOGIC:  Alert and oriented x 3 PSYCHIATRIC:  Normal affect   ASSESSMENT:    1. Abnormal nuclear stress test   2. Essential hypertension    PLAN:    Abnormal stress test: Leane Call 11/2018 at [facility] showed partially reversible anterior defect, could represent artifact.  Denies any recent chest pain.  Echo shows no significant abnormality.  Will evaluate for ischemia with ETT.  Hypertension: On enalapril 20 mg daily and nifedipine 30 mg daily.  Appears controlled, 128/80 in clinic today.  T2DM: on insulin.  A1c 6.1.    RTC in 6 months   The risks [chest pain, shortness of breath, cardiac arrhythmias, dizziness, blood pressure fluctuations, myocardial infarction, stroke/transient ischemic attack, and life-threatening complications (estimated to be 1 in 10,000)], benefits (risk stratification, diagnosing coronary artery disease, treatment guidance) and alternatives of an exercise tolerance test were discussed in detail with Ms. Betsill and she agrees to proceed.     Medication Adjustments/Labs and Tests Ordered: Current medicines are reviewed at length with the patient today.  Concerns regarding medicines are outlined above.  Orders Placed This Encounter  Procedures  . EXERCISE TOLERANCE TEST (ETT)   No orders of the defined types were placed in this encounter.   Patient Instructions  Medication Instructions:  Your physician recommends that you continue on your current medications as directed. Please refer to the Current Medication list given to you today.  *If you need a refill on your cardiac medications before your next appointment, please call your pharmacy*  Testing/Procedures: Your physician has requested that you have an exercise tolerance test. For further information please visit [email or web address]. Please  also follow instruction sheet, as given. --this will require a covid test 3 days prior, we will schedule this for you.   Follow-Up: At [facility], you and your health needs are our priority.  As part of our continuing mission to provide you with exceptional heart care, we have created designated Provider Care Teams.  These Care Teams include your primary Cardiologist (physician) and Advanced Practice  Providers (APPs -  Physician Assistants and Nurse Practitioners) who all work together to provide you with the care you need, when you need it.  We recommend signing up for the patient portal called "MyChart".  Sign up information is provided on this After Visit Summary.  MyChart is used to connect with patients for Virtual Visits (Telemedicine).  Patients are able to view lab/test results, encounter notes, upcoming appointments, etc.  Non-urgent messages can be sent to your provider as well.   To learn more about what you can do with MyChart, go to [email or web address].    Your next appointment:   6 month(s)  The format for your next appointment:   In Person  Provider:   Oswaldo Milian, MD   Please check your blood pressure at home daily, write it down.  Call the office or send message via Mychart with the readings in 2 weeks for Dr. Gardiner Rhyme to review.      Signed, Donato Heinz, MD  04/27/2020 8:43 PM    [place]

## 2020-04-26 ENCOUNTER — Other Ambulatory Visit: Payer: Self-pay

## 2020-04-26 ENCOUNTER — Encounter: Payer: Self-pay | Admitting: Cardiology

## 2020-04-26 ENCOUNTER — Ambulatory Visit (INDEPENDENT_AMBULATORY_CARE_PROVIDER_SITE_OTHER): Payer: Medicaid Other | Admitting: Cardiology

## 2020-04-26 VITALS — BP 128/80 | HR 96 | Ht 70.0 in | Wt 239.0 lb

## 2020-04-26 DIAGNOSIS — R9439 Abnormal result of other cardiovascular function study: Secondary | ICD-10-CM

## 2020-04-26 DIAGNOSIS — I10 Essential (primary) hypertension: Secondary | ICD-10-CM | POA: Diagnosis not present

## 2020-04-26 NOTE — Patient Instructions (Addendum)
Medication Instructions:  Your physician recommends that you continue on your current medications as directed. Please refer to the Current Medication list given to you today.  *If you need a refill on your cardiac medications before your next appointment, please call your pharmacy*  Testing/Procedures: Your physician has requested that you have an exercise tolerance test. For further information please visit [email or web address] Please also follow instruction sheet, as given. --this will require a covid test 3 days prior, we will schedule this for you.   Follow-Up: At [facility], you and your health needs are our priority.  As part of our continuing mission to provide you with exceptional heart care, we have created designated Provider Care Teams.  These Care Teams include your primary Cardiologist (physician) and Advanced Practice Providers (APPs -  Physician Assistants and Nurse Practitioners) who all work together to provide you with the care you need, when you need it.  We recommend signing up for the patient portal called "MyChart".  Sign up information is provided on this After Visit Summary.  MyChart is used to connect with patients for Virtual Visits (Telemedicine).  Patients are able to view lab/test results, encounter notes, upcoming appointments, etc.  Non-urgent messages can be sent to your provider as well.   To learn more about what you can do with MyChart, go to [email or web address].    Your next appointment:   6 month(s)  The format for your next appointment:   In Person  Provider:   Epifanio Lesches, MD   Please check your blood pressure at home daily, write it down.  Call the office or send message via Mychart with the readings in 2 weeks for Dr. Bjorn Pippin to review.

## 2020-04-27 NOTE — Addendum Note (Signed)
Addended by: Johney Frame A on: 04/27/2020 11:50 AM   Modules accepted: Orders

## 2020-04-27 NOTE — Addendum Note (Signed)
Addended by: Epifanio Lesches on: 04/27/2020 08:43 PM   Modules accepted: Orders

## 2020-04-29 ENCOUNTER — Telehealth (HOSPITAL_COMMUNITY): Payer: Self-pay | Admitting: Cardiology

## 2020-04-29 NOTE — Telephone Encounter (Signed)
Called patient on two different attempts to schedule GXT and patient states she does not know work schedule and she will call me back. Order will be removed from the WQ and when pt calls back we can reinstate the order.

## 2020-04-29 NOTE — Telephone Encounter (Signed)
Informed RN about this and we will await pt's call back to schedule

## 2020-05-03 NOTE — Addendum Note (Signed)
Addended by: Barrie Dunker on: 05/03/2020 01:57 PM   Modules accepted: Orders

## 2020-05-04 ENCOUNTER — Encounter (HOSPITAL_COMMUNITY): Payer: Medicaid Other

## 2020-05-16 ENCOUNTER — Other Ambulatory Visit (HOSPITAL_COMMUNITY)
Admission: RE | Admit: 2020-05-16 | Discharge: 2020-05-16 | Disposition: A | Discharge status: Home / Self Care | Payer: Medicaid Other | Source: Ambulatory Visit | Attending: Cardiology | Admitting: Cardiology

## 2020-05-16 DIAGNOSIS — Z20822 Contact with and (suspected) exposure to covid-19: Secondary | ICD-10-CM | POA: Diagnosis not present

## 2020-05-16 DIAGNOSIS — Z01812 Encounter for preprocedural laboratory examination: Secondary | ICD-10-CM | POA: Insufficient documentation

## 2020-05-16 LAB — SARS CORONAVIRUS 2 (TAT 6-24 HRS): SARS Coronavirus 2: NEGATIVE

## 2020-05-17 IMAGING — CT CT ANGIO CHEST
2 of 7 series · 18 of 46 positions shown · IV contrast (ISOVUE)
Comparison: Chest radiograph May 29, 2018

CLINICAL DATA: Chest pain

EXAM:
CT ANGIOGRAPHY CHEST WITH CONTRAST
TECHNIQUE: Multidetector CT imaging of the chest was performed using the
standard protocol during bolus administration of intravenous
contrast. Multiplanar CT image reconstructions and MIPs were
obtained to evaluate the vascular anatomy.
CONTRAST:  100mL 7XRQP8-W8C IOPAMIDOL (7XRQP8-W8C) INJECTION 76%

[Series 5: thins · axial · 0.77mm/px · z∈[-236,-12]mm · 15 of 254 slices shown]
[im 15/254  lung]
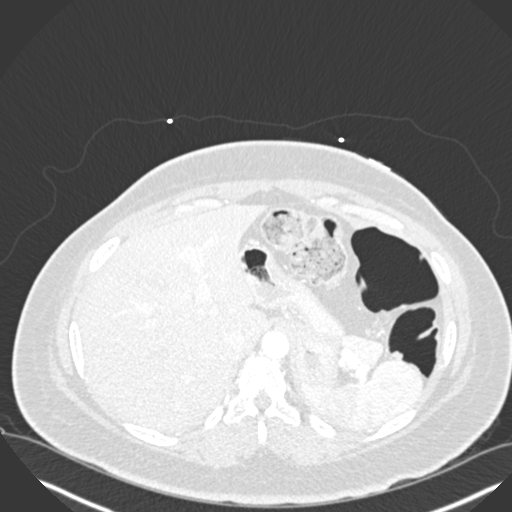
[im 30/254  soft-tissue]
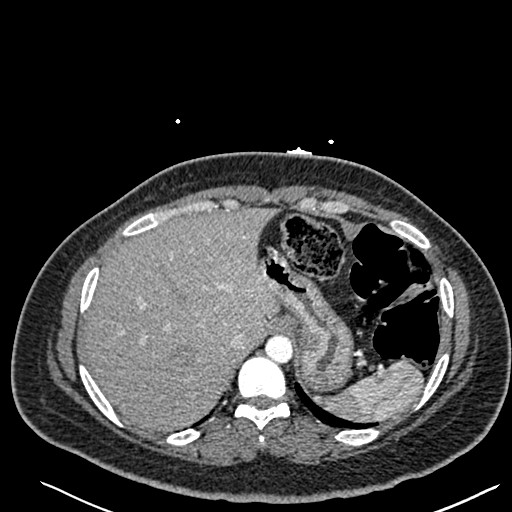
[im 45/254  lung]
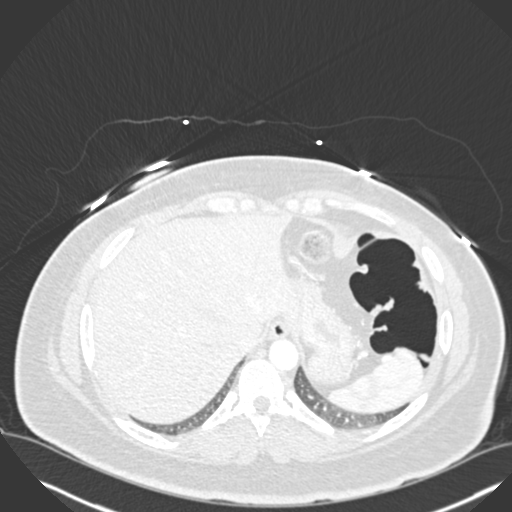
[im 60/254  soft-tissue]
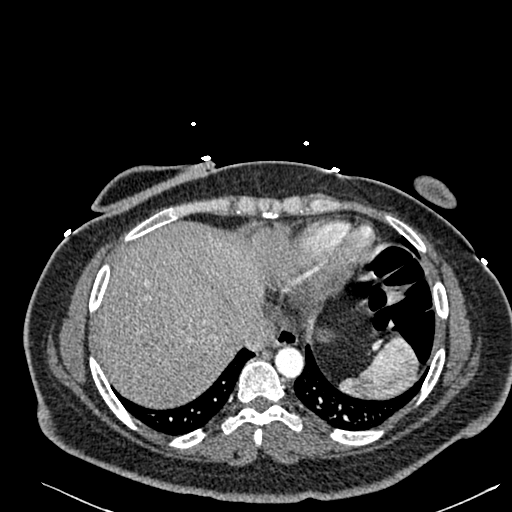
[im 75/254  lung]
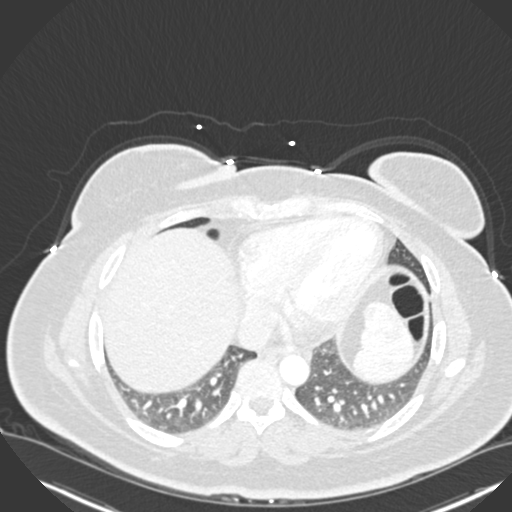
[im 90/254  soft-tissue]
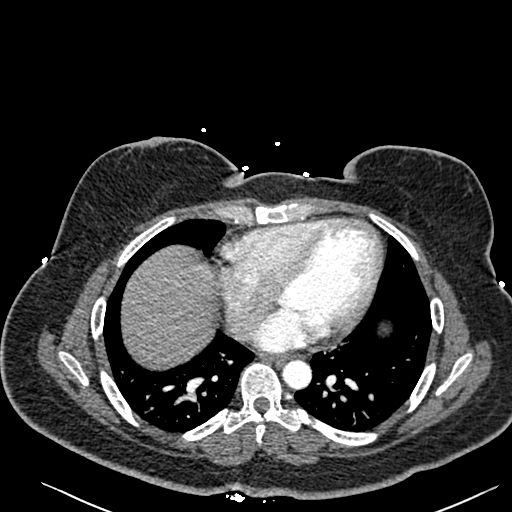
[im 105/254  lung]
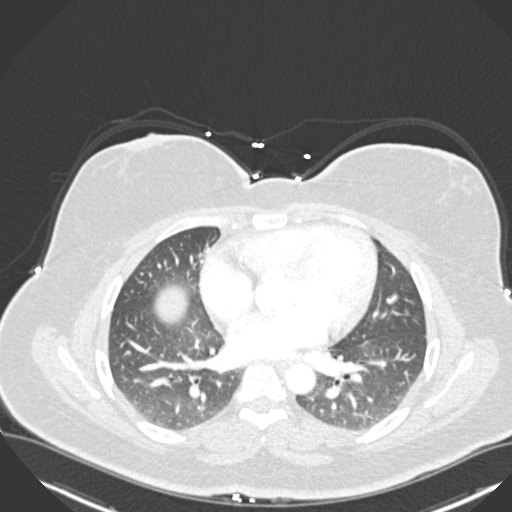
[im 134/254  soft-tissue]
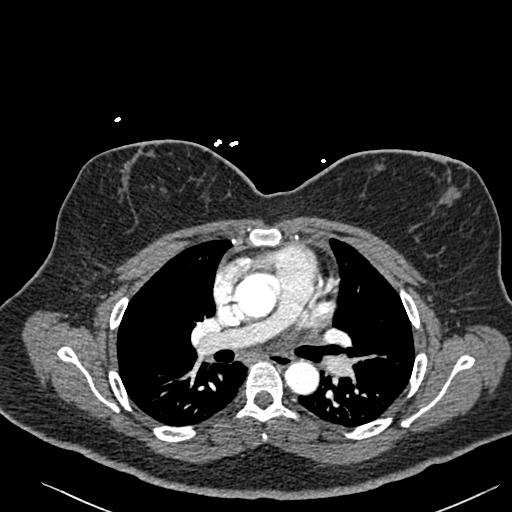
[im 149/254  lung]
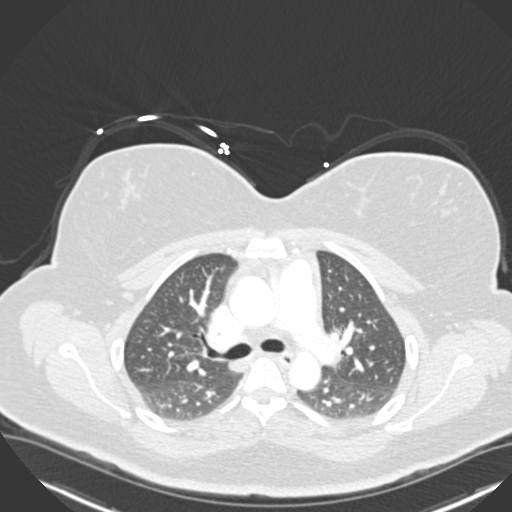
[im 164/254  soft-tissue]
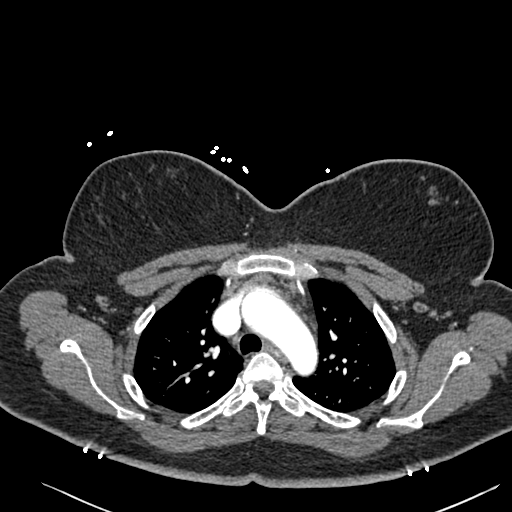
[im 179/254  lung]
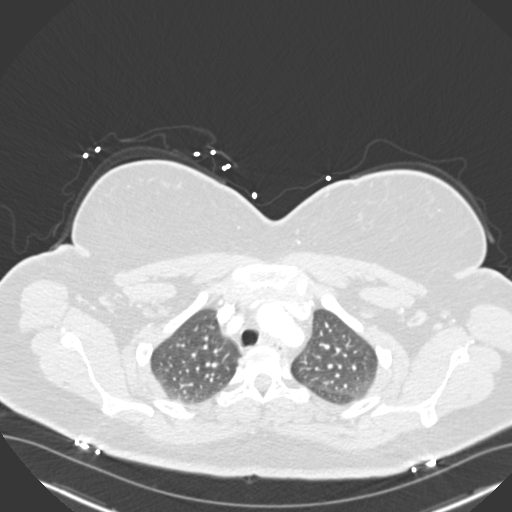
[im 194/254  soft-tissue]
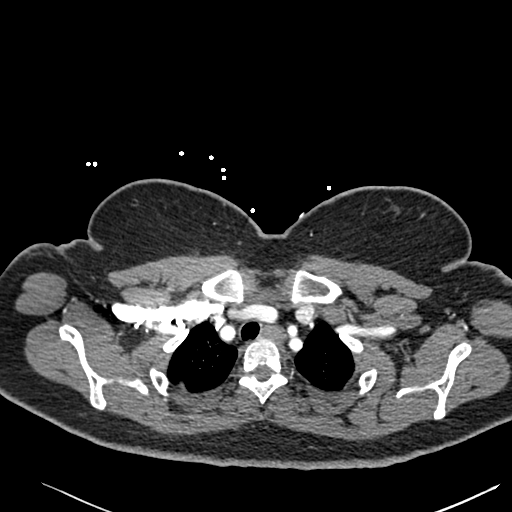
[im 209/254  lung]
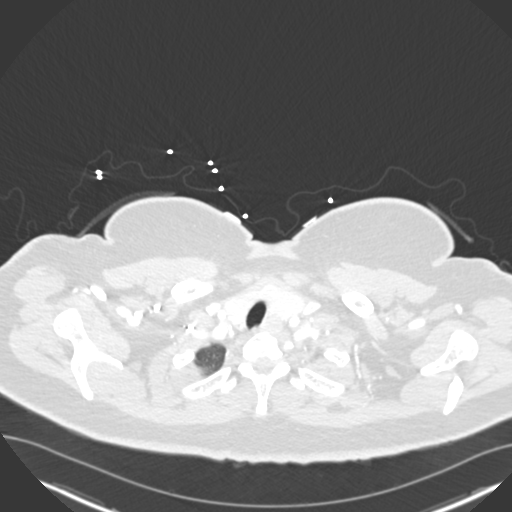
[im 224/254  soft-tissue]
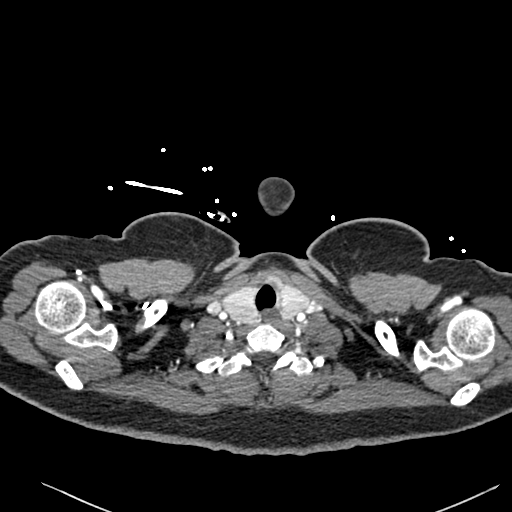
[im 239/254  lung]
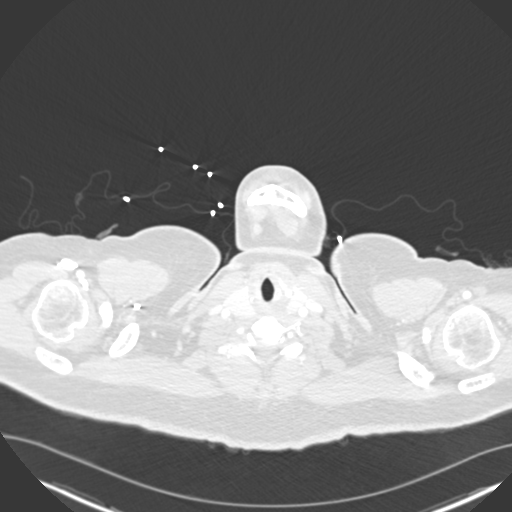

[Series 7: coronal mpr · coronal · 0.45mm/px · 3 of 149 slices shown]
[im 38/149  soft-tissue]
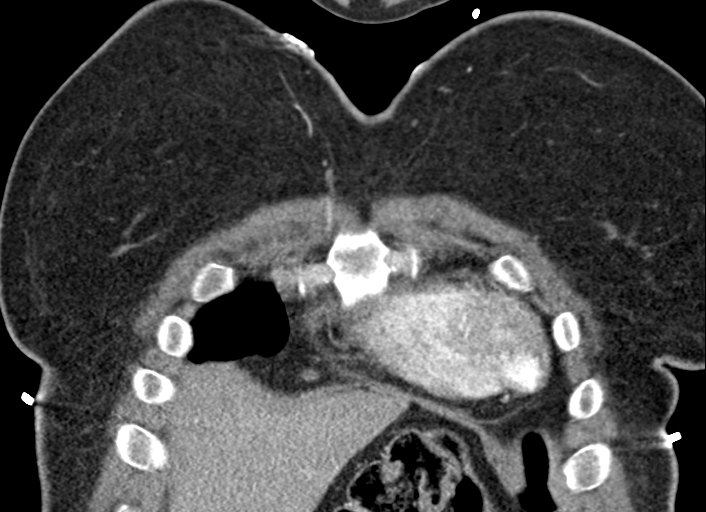
[im 75/149  soft-tissue]
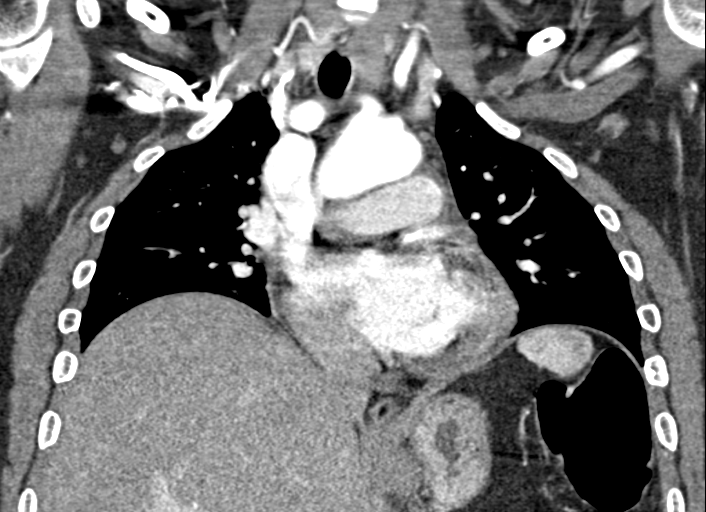
[im 112/149  soft-tissue]
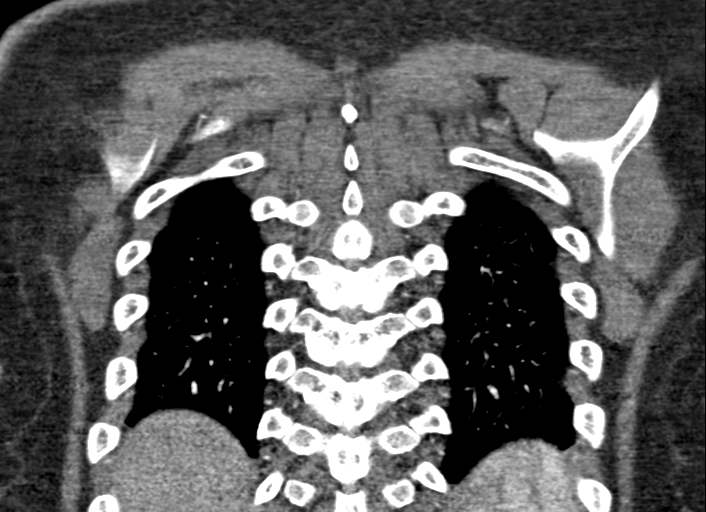

[18 of 46 positions shown; findings below may reference images not displayed]

FINDINGS: Cardiovascular: There is no demonstrable pulmonary embolus. There is
no thoracic aortic aneurysm or dissection. The visualized great
vessels appear unremarkable. No pericardial effusion or pericardial
thickening evident. The main pulmonary outflow tract is borderline
prominent at 3.0 cm diameter.

Mediastinum/Nodes: Visualized thyroid appears unremarkable. There is
no appreciable thoracic adenopathy. No esophageal lesions are
evident.

Lungs/Pleura: There is no evident edema or consolidation. No pleural
effusion or pleural thickening appreciable.

Upper Abdomen: Visualized upper abdominal structures appear
unremarkable.

Musculoskeletal: There are no blastic or lytic bone lesions. No
chest wall lesions are evident.

Review of the MIP images confirms the above findings.
IMPRESSION: 1. No demonstrable pulmonary embolus. There is no thoracic aortic
aneurysm or dissection.

2. borderline prominent main pulmonary outflow tract. Question a
degree of underlying pulmonary arterial hypertension.

3.  Lungs clear.

4.  No evident thoracic adenopathy.

## 2020-05-18 ENCOUNTER — Encounter: Payer: Self-pay | Admitting: General Practice

## 2020-05-18 ENCOUNTER — Telehealth (HOSPITAL_COMMUNITY): Payer: Self-pay | Admitting: *Deleted

## 2020-05-18 NOTE — Telephone Encounter (Signed)
Close encounter 

## 2020-05-19 ENCOUNTER — Ambulatory Visit (HOSPITAL_COMMUNITY)
Admission: RE | Admit: 2020-05-19 | Discharge: 2020-05-19 | Disposition: A | Discharge status: Home / Self Care | Payer: Medicaid Other | Source: Ambulatory Visit | Attending: Cardiology | Admitting: Cardiology

## 2020-05-19 ENCOUNTER — Other Ambulatory Visit: Payer: Self-pay

## 2020-05-19 DIAGNOSIS — R9439 Abnormal result of other cardiovascular function study: Secondary | ICD-10-CM | POA: Diagnosis present

## 2020-05-20 LAB — EXERCISE TOLERANCE TEST
Estimated workload: 10.3 METS
Exercise duration (min): 9 min
Exercise duration (sec): 9 s
MPHR: 187 {beats}/min
Peak HR: 173 {beats}/min
Percent HR: 92 %
Rest HR: 97 {beats}/min

## 2020-10-28 ENCOUNTER — Ambulatory Visit: Payer: Medicaid Other | Admitting: Cardiology

## 2020-10-28 NOTE — Progress Notes (Deleted)
Cardiology Office Note:    Date:  10/28/2020   ID:  Joyce Green, DOB 1985/12/07, MRN [id number]  PCP:  Courtney Paris, NP  Cardiologist:  Little Ishikawa, MD  Electrophysiologist:  None   Referring MD: Courtney Paris, NP   No chief complaint on file.   History of Present Illness:    Joyce Green is a 35 y.o. female with a hx of hypertension, type 2 diabetes who presents for follow-up.  She was referred by Dr. Vergie Living for evaluation of hypertension.  She was [redacted] weeks pregnant at first office visit in 09/2019.  She previously had seen a cardiologist at [facility].  Had episode of chest pain which she was seen in the ED 04/2019.  Reports had TTE done last year. TTE 11/18/2012 showed normal LVEF, mild LVH.  Underwent nuclear stress test 11/12/2018 that showed partially reversible anterior wall defect.  CTPA on 05/29/2018 showed no PE but did note borderline prominent main pulmonary outflow tract concerning for underlying pulmonary hypertension.  She currently denies any recent chest pain.  Reports during first trimester having significant dyspnea.  However that has improved, currently no shortness of breath.  She was prescribed last week nifedipine by her obstetrician, but has not started taking it yet.  Reports having palpitations last year but none in the last 3 to 4 months.  Reports that she smokes marijuana 2-3 times per year.  No cigarettes.  No family history of heart disease in her immediate family  TTE on 10/22/2018 showed LVEF 60 to 65%, normal RV function, mild LVH.  ETT on 05/19/2020 showed good exercise capacity (10.3 METS), normal BP response to exercise, no evidence of ischemia.  Since last clinic visit,  she underwent uncomplicated delivery via cesarean section on 02/11/2020.  She subsequently was admitted on postop day 7 for severe postpartum preeclampsia superimposed on chronic hypertension.  She was discharged on enalapril 20 mg daily and nifedipine 30 mg  daily.  Has not been checking BP at home.   Denies any chest pain or dyspnea.  Denies any lightheadedness, syncope, or palpitations.  She started Zumba for exercise, has been going twice per week for 45 minutes.   Past Medical History:  Diagnosis Date  . Bacteremia 03/2018   approximately 10 days after vbac  . Complication of anesthesia    intense itching after CS  . Diabetes mellitus without complication ([facility])    T2  . Hypertension   . Palpitations     Past Surgical History:  Procedure Laterality Date  . CESAREAN SECTION N/A 02/20/2014   Procedure: CESAREAN SECTION;  Surgeon: Lazaro Arms, MD;  Location: [facility];  Service: Obstetrics;  Laterality: N/A;  . CESAREAN SECTION N/A 07/28/2016   Procedure: CESAREAN SECTION;  Surgeon: Catalina Antigua, MD;  Location: [facility];  Service: Obstetrics;  Laterality: N/A;  . CESAREAN SECTION WITH BILATERAL TUBAL LIGATION Bilateral 02/11/2020   Procedure: CESAREAN SECTION WITH BILATERAL TUBAL LIGATION;  Surgeon: Catalina Antigua, MD;  Location: [facility];  Service: Obstetrics;  Laterality: Bilateral;    Current Medications: No outpatient medications have been marked as taking for the 10/28/20 encounter (Appointment) with Little Ishikawa, MD.     Allergies:   Patient has no known allergies.   Social History   Socioeconomic History  . Marital status: Married    Spouse name: Not on file  . Number of children: Not on file  . Years of education: Not on file  . Highest  education level: Not on file  Occupational History  . Not on file  Tobacco Use  . Smoking status: Former Smoker    Packs/day: 0.00    Years: 0.00    Pack years: 0.00    Types: Cigars    Quit date: 06/11/2009    Years since quitting: 11.3  . Smokeless tobacco: Never Used  . Tobacco comment: not while pregnant  Vaping Use  . Vaping Use: Never used  Substance and Sexual Activity  . Alcohol use: Not Currently    Alcohol/week: 4.0 standard drinks    Types: 2  Glasses of wine, 2 Cans of beer per week    Comment: occasional; stopped after found out she was pregnant  . Drug use: Not Currently    Types: Marijuana    Comment: per prenatal record, +THC on initial visit u/a; no current use  . Sexual activity: Yes    Partners: Male    Birth control/protection: None  Other Topics Concern  . Not on file  Social History Narrative  . Not on file   Social Determinants of Health   Financial Resource Strain: Not on file  Food Insecurity: No Food Insecurity  . Worried About Programme researcher, broadcasting/film/video in the Last Year: Never true  . Ran Out of Food in the Last Year: Never true  Transportation Needs: No Transportation Needs  . Lack of Transportation (Medical): No  . Lack of Transportation (Non-Medical): No  Physical Activity: Not on file  Stress: Not on file  Social Connections: Not on file     Family History: The patient's family history includes Cancer in her maternal grandfather, maternal grandmother, paternal grandfather, and paternal grandmother; Diabetes in her mother; Diabetes Mellitus I in her mother; Hypertension in her father and mother; Lung cancer in her father. There is no history of Early death, Heart disease, Hyperlipidemia, Kidney disease, or Stroke.  ROS:   Please see the history of present illness.    All other systems reviewed and are negative.  EKGs/Labs/Other Studies Reviewed:    The following studies were reviewed today:   EKG:  EKG is  ordered today.  The ekg ordered today demonstrates sinus rhythm, rate 96, no ST/T wave abnormalities  Recent Labs: 02/19/2020: Magnesium 4.3 02/25/2020: ALT 10; BUN 11; Creatinine, Ser 0.60; Hemoglobin 10.9; Platelets 259; Potassium 4.2; Sodium 142  Recent Lipid Panel    Component Value Date/Time   CHOL 156 11/06/2017 1510   TRIG 104 11/06/2017 1510   HDL 49 11/06/2017 1510   CHOLHDL 3.2 11/06/2017 1510   CHOLHDL 3 01/07/2013 1558   VLDL 8.6 01/07/2013 1558   LDLCALC 86 11/06/2017 1510     Physical Exam:    VS:  There were no vitals taken for this visit.    Wt Readings from Last 3 Encounters:  04/26/20 239 lb (108.4 kg)  03/15/20 242 lb 1.6 oz (109.8 kg)  03/10/20 239 lb (108.4 kg)     GEN:   in no acute distress HEENT: Normal NECK: No JVD LYMPHATICS: No lymphadenopathy CARDIAC: RRR, no murmurs, rubs, gallops RESPIRATORY:  Clear to auscultation without rales, wheezing or rhonchi  ABDOMEN: Soft, non-tender, non-distended MUSCULOSKELETAL:  No edema SKIN: Warm and dry NEUROLOGIC:  Alert and oriented x 3 PSYCHIATRIC:  Normal affect   ASSESSMENT:    No diagnosis found. PLAN:    Abnormal stress test: Eugenie Birks Myoview 11/2018 at [facility] showed partially reversible anterior defect, could represent artifact.  Denies any recent chest pain.  Echo shows no significant abnormality.  ETT on 05/19/2020 showed good exercise capacity (10.3 METS), normal BP response to exercise, no evidence of ischemia.  Hypertension: On enalapril 20 mg daily and nifedipine 30 mg daily.  Appears controlled, 128/80 in clinic today.  T2DM: on insulin.  A1c 6.1.    RTC in ***   Medication Adjustments/Labs and Tests Ordered: Current medicines are reviewed at length with the patient today.  Concerns regarding medicines are outlined above.  No orders of the defined types were placed in this encounter.  No orders of the defined types were placed in this encounter.   There are no Patient Instructions on file for this visit.   Signed, Little Ishikawa, MD  10/28/2020 6:32 AM    [facility]

## 2020-12-29 ENCOUNTER — Encounter: Payer: Self-pay | Admitting: Podiatry

## 2020-12-29 ENCOUNTER — Other Ambulatory Visit: Payer: Self-pay

## 2020-12-29 ENCOUNTER — Ambulatory Visit (INDEPENDENT_AMBULATORY_CARE_PROVIDER_SITE_OTHER): Payer: Medicaid Other | Admitting: Podiatry

## 2020-12-29 DIAGNOSIS — M722 Plantar fascial fibromatosis: Secondary | ICD-10-CM

## 2020-12-29 NOTE — Progress Notes (Signed)
Subjective:  Patient ID: Joyce Green, female    DOB: 1986-02-23,  MRN: [id number]  Chief Complaint  Patient presents with   Diabetes    Diabetic foot exam     35 y.o. female presents with the above complaint.  Patient presents with complaint left heel pain.  Patient states that it has been on for quite some time.  She is a diabetic but her sugars are well controlled.  She would like to discuss his heel pain.  She denies any other acute complaints she has not seen anyone else prior to seeing me.  Hurts with taking a for step pain scale is 8 out of 10 gets better throughout the day.  Is sharp shooting in nature.   Review of Systems: Negative except as noted in the HPI. Denies N/V/F/Ch.  Past Medical History:  Diagnosis Date   Bacteremia 03/2018   approximately 10 days after vbac   Complication of anesthesia    intense itching after CS   Diabetes mellitus without complication (HCC)    T2   Hypertension    Palpitations     Current Outpatient Medications:    ACCU-CHEK FASTCLIX LANCETS MISC, 1 Device by Percutaneous route 4 (four) times daily., Disp: 100 each, Rfl: 12   acetaminophen (TYLENOL) 500 MG tablet, Take 2 tablets (1,000 mg total) by mouth every 6 (six) hours as needed., Disp: 30 tablet, Rfl: 0   Blood Glucose Monitoring Suppl (ACCU-CHEK NANO SMARTVIEW) w/Device KIT, 1 kit by Subdermal route as directed. Check blood sugars for fasting, and two hours after breakfast, lunch and dinner (4 checks daily), Disp: 1 kit, Rfl: 0   butalbital-acetaminophen-caffeine (FIORICET) 50-325-40 MG tablet, Take 1-2 tablets by mouth every 4 (four) hours as needed for headache., Disp: 30 tablet, Rfl: 0   enalapril (VASOTEC) 20 MG tablet, Take 20 mg by mouth daily., Disp: , Rfl:    glucose blood (ACCU-CHEK SMARTVIEW) test strip, Use as instructed to check blood sugars, Disp: 120 each, Rfl: 12   glucose blood test strip, Use as instructed, Disp: 100 each, Rfl: 12   hydrocortisone cream 0.5 %,  Apply 1 application topically 2 (two) times daily as needed (itching/blemishes.)., Disp: , Rfl:    insulin glargine (LANTUS) 100 unit/mL SOPN, Inject 0.3 mLs (30 Units total) into the skin at bedtime., Disp: 9 mL, Rfl: 0   metFORMIN (GLUCOPHAGE) 1000 MG tablet, Take 1 tablet (1,000 mg total) by mouth 2 (two) times daily with a meal., Disp: 90 tablet, Rfl: 2   NIFEdipine (PROCARDIA XL/NIFEDICAL XL) 60 MG 24 hr tablet, Take 60 mg by mouth daily., Disp: , Rfl:    pravastatin (PRAVACHOL) 10 MG tablet, Take 10 mg by mouth at bedtime., Disp: , Rfl:    Prenatal Vit-Fe Fumarate-FA (PREPLUS) 27-1 MG TABS, Take 1 tablet by mouth daily., Disp: 30 tablet, Rfl: 13  Social History   Tobacco Use  Smoking Status Former   Packs/day: 0.00   Years: 0.00   Pack years: 0.00   Types: Cigars, Cigarettes   Quit date: 06/11/2009   Years since quitting: 11.5  Smokeless Tobacco Never  Tobacco Comments   not while pregnant    No Known Allergies Objective:  There were no vitals filed for this visit. There is no height or weight on file to calculate BMI. Constitutional Well developed. Well nourished.  Vascular Dorsalis pedis pulses palpable bilaterally. Posterior tibial pulses palpable bilaterally. Capillary refill normal to all digits.  No cyanosis or clubbing noted. Pedal hair growth  normal.  Neurologic Normal speech. Oriented to person, place, and time. Epicritic sensation to light touch grossly present bilaterally.  Dermatologic Nails well groomed and normal in appearance. No open wounds. No skin lesions.  Orthopedic: Normal joint ROM without pain or crepitus bilaterally. No visible deformities. Tender to palpation at the calcaneal tuber left. No pain with calcaneal squeeze left. Ankle ROM diminished range of motion left. Silfverskiold Test: negative left.   Radiographs: None Assessment:   1. Plantar fasciitis, left    Plan:  Patient was evaluated and treated and all questions  answered.  Plantar Fasciitis, left - XR reviewed as above.  - Educated on icing and stretching. Instructions given.  - Injection delivered to the plantar fascia as below. - DME: Plantar Fascial Brace - Pharmacologic management: None  Procedure: Injection Tendon/Ligament Location: Left plantar fascia at the glabrous junction; medial approach. Skin Prep: alcohol Injectate: 0.5 cc 0.5% marcaine plain, 0.5 cc of 1% Lidocaine, 0.5 cc kenalog 10. Disposition: Patient tolerated procedure well. Injection site dressed with a band-aid.  No follow-ups on file.

## 2021-01-26 ENCOUNTER — Ambulatory Visit: Payer: Medicaid Other | Admitting: Podiatry

## 2022-02-26 DIAGNOSIS — M654 Radial styloid tenosynovitis [de Quervain]: Secondary | ICD-10-CM | POA: Insufficient documentation

## 2022-02-26 DIAGNOSIS — M25532 Pain in left wrist: Secondary | ICD-10-CM | POA: Insufficient documentation

## 2022-12-18 ENCOUNTER — Other Ambulatory Visit (HOSPITAL_COMMUNITY): Payer: Self-pay | Admitting: Internal Medicine

## 2022-12-18 DIAGNOSIS — I288 Other diseases of pulmonary vessels: Secondary | ICD-10-CM

## 2023-01-02 ENCOUNTER — Ambulatory Visit (HOSPITAL_COMMUNITY): Payer: Medicaid Other

## 2023-01-11 ENCOUNTER — Ambulatory Visit (HOSPITAL_COMMUNITY): Payer: Medicaid Other

## 2023-01-21 ENCOUNTER — Other Ambulatory Visit (HOSPITAL_COMMUNITY): Payer: Self-pay | Admitting: Student

## 2023-01-21 ENCOUNTER — Ambulatory Visit (HOSPITAL_COMMUNITY)
Admission: RE | Admit: 2023-01-21 | Discharge: 2023-01-21 | Disposition: A | Discharge status: Home / Self Care | Payer: Medicaid Other | Source: Ambulatory Visit | Attending: Internal Medicine | Admitting: Internal Medicine

## 2023-01-21 ENCOUNTER — Ambulatory Visit (HOSPITAL_COMMUNITY)
Admission: RE | Admit: 2023-01-21 | Discharge: 2023-01-21 | Disposition: A | Discharge status: Home / Self Care | Payer: Medicaid Other | Source: Ambulatory Visit | Attending: Student | Admitting: Student

## 2023-01-21 DIAGNOSIS — I878 Other specified disorders of veins: Secondary | ICD-10-CM | POA: Diagnosis present

## 2023-01-21 DIAGNOSIS — I288 Other diseases of pulmonary vessels: Secondary | ICD-10-CM | POA: Diagnosis not present

## 2023-01-21 DIAGNOSIS — E1169 Type 2 diabetes mellitus with other specified complication: Secondary | ICD-10-CM | POA: Diagnosis present

## 2023-01-21 DIAGNOSIS — Z9189 Other specified personal risk factors, not elsewhere classified: Secondary | ICD-10-CM | POA: Diagnosis not present

## 2023-01-21 DIAGNOSIS — I872 Venous insufficiency (chronic) (peripheral): Secondary | ICD-10-CM | POA: Diagnosis not present

## 2023-01-21 HISTORY — PX: IR US GUIDE VASC ACCESS RIGHT: IMG2390

## 2023-01-21 HISTORY — PX: IR RADIOLOGY PERIPHERAL GUIDED IV START: IMG5598

## 2023-01-21 LAB — POCT I-STAT CREATININE: Creatinine, Ser: 0.7 mg/dL (ref 0.44–1.00)

## 2023-01-21 MED ORDER — LIDOCAINE HCL 1 % IJ SOLN
INTRAMUSCULAR | Status: AC
  Filled 2023-01-21: qty 20

## 2023-01-21 MED ORDER — IOHEXOL 350 MG/ML SOLN
75.0000 mL | Freq: Once | INTRAVENOUS | Status: AC | PRN
  Administered 2023-01-21: 75 mL via INTRAVENOUS

## 2023-05-30 ENCOUNTER — Ambulatory Visit: Payer: Self-pay

## 2023-09-24 ENCOUNTER — Ambulatory Visit: Admitting: Family Medicine

## 2023-09-24 ENCOUNTER — Encounter: Payer: Self-pay | Admitting: Family Medicine

## 2023-09-24 ENCOUNTER — Encounter: Payer: Self-pay | Admitting: *Deleted

## 2023-09-24 VITALS — BP 127/85 | HR 89 | Temp 98.3°F | Resp 16 | Ht 72.05 in | Wt 215.8 lb

## 2023-09-24 DIAGNOSIS — E1169 Type 2 diabetes mellitus with other specified complication: Secondary | ICD-10-CM | POA: Diagnosis not present

## 2023-09-24 DIAGNOSIS — Z794 Long term (current) use of insulin: Secondary | ICD-10-CM

## 2023-09-24 DIAGNOSIS — I10 Essential (primary) hypertension: Secondary | ICD-10-CM

## 2023-09-24 DIAGNOSIS — Z7689 Persons encountering health services in other specified circumstances: Secondary | ICD-10-CM | POA: Diagnosis not present

## 2023-09-24 LAB — POCT GLYCOSYLATED HEMOGLOBIN (HGB A1C): Hemoglobin A1C: 6.5 % — AB (ref 4.0–5.6)

## 2023-09-24 MED ORDER — OZEMPIC (2 MG/DOSE) 8 MG/3ML ~~LOC~~ SOPN
2.0000 mg | PEN_INJECTOR | SUBCUTANEOUS | 99 refills | Status: DC
Start: 2023-09-24 — End: 2023-10-02

## 2023-09-24 MED ORDER — PRAVASTATIN SODIUM 10 MG PO TABS
10.0000 mg | ORAL_TABLET | Freq: Every day | ORAL | 2 refills | Status: AC
Start: 2023-09-24 — End: ?

## 2023-09-24 MED ORDER — METFORMIN HCL 500 MG PO TABS: 500.0000 mg | ORAL_TABLET | Freq: Two times a day (BID) | ORAL | 2 refills | Status: AC

## 2023-09-24 NOTE — Patient Instructions (Signed)

## 2023-09-24 NOTE — Progress Notes (Signed)
 New Patient Office Visit  Subjective   Patient ID: Joyce Green, female    DOB: 01-21-1986  Age: 38 y.o. MRN: [id number]  CC:  Chief Complaint  Patient presents with   Establish Care    Pt. Her to establish care.   HPI Joyce Green is a 38 year old female who presents to establish with [facility].   CC: Patient here to establish care  Last PCP: Joyce Paris, NP- [facility] in [place], [place]  Specialists: OBGYN, cardiology   HTN: [name] presents for the medical management of hypertension.  Patient's current hypertension medication regimen is: amlodipine-olmesartan 10-40mg  daily  Patient is currently taking prescribed medications for HTN. Varies at home  Patient is regularly keeping a check on BP at home.  Denies headache, dizziness, CP, SHOB, vision changes.    BP Readings from Last 3 Encounters:  09/24/23 127/85  04/26/20 128/80  03/15/20 (!) 143/91   DM2: Currently taking Metformin 500mg  BID & Ozempic 2mg  weekly  Fasting BGLs are 90-110, she does eat late breakfast  Pravastatin 10mg  at bedtime  A1c due today- last checked in Dec 2024 Retinal eye exam in March 2025- [facility]   Outpatient Encounter Medications as of 09/24/2023  Medication Sig   ACCU-CHEK FASTCLIX LANCETS MISC 1 Device by Percutaneous route 4 (four) times daily.   Blood Glucose Monitoring Suppl (ACCU-CHEK NANO SMARTVIEW) w/Device KIT 1 kit by Subdermal route as directed. Check blood sugars for fasting, and two hours after breakfast, lunch and dinner (4 checks daily)   glucose blood (ACCU-CHEK SMARTVIEW) test strip Use as instructed to check blood sugars   glucose blood test strip Use as instructed   hydrocortisone cream 0.5 % Apply 1 application topically 2 (two) times daily as needed (itching/blemishes.).   levonorgestrel (MIRENA) 20 MCG/DAY IUD 1 each by Intrauterine route once. (21 mcg/24hr(upto 8 yrs)   [DISCONTINUED] metFORMIN (GLUCOPHAGE)  1000 MG tablet Take 1 tablet (1,000 mg total) by mouth 2 (two) times daily with a meal.   [DISCONTINUED] pravastatin (PRAVACHOL) 10 MG tablet Take 10 mg by mouth at bedtime.   amLODipine-olmesartan (AZOR) 10-40 MG tablet Take 1 tablet by mouth daily.   metFORMIN (GLUCOPHAGE) 500 MG tablet Take 1 tablet (500 mg total) by mouth 2 (two) times daily with a meal.   OZEMPIC, 2 MG/DOSE, 8 MG/3ML SOPN Inject 2 mg into the skin once a week.   pravastatin (PRAVACHOL) 10 MG tablet Take 1 tablet (10 mg total) by mouth at bedtime.   [DISCONTINUED] acetaminophen (TYLENOL) 500 MG tablet Take 2 tablets (1,000 mg total) by mouth every 6 (six) hours as needed.   [DISCONTINUED] butalbital-acetaminophen-caffeine (FIORICET) 50-325-40 MG tablet Take 1-2 tablets by mouth every 4 (four) hours as needed for headache.   [DISCONTINUED] enalapril (VASOTEC) 20 MG tablet Take 20 mg by mouth daily.   [DISCONTINUED] insulin glargine (LANTUS) 100 unit/mL SOPN Inject 0.3 mLs (30 Units total) into the skin at bedtime.   [DISCONTINUED] NIFEdipine (PROCARDIA XL/NIFEDICAL XL) 60 MG 24 hr tablet Take 60 mg by mouth daily.   [DISCONTINUED] OZEMPIC, 2 MG/DOSE, 8 MG/3ML SOPN Inject 2 mg into the skin once a week.   [DISCONTINUED] Prenatal Vit-Fe Fumarate-FA (PREPLUS) 27-1 MG TABS Take 1 tablet by mouth daily.   No facility-administered encounter medications on file as of 09/24/2023.    Patient Active Problem List   Diagnosis Date Noted   Pain in left wrist 02/26/2022   Radial styloid tenosynovitis (  de quervain) 02/26/2022   History of severe pre-eclampsia 02/18/2020   History of VBAC 08/26/2019   Abnormal cardiovascular stress test 08/26/2019   Supervision of high risk pregnancy, antepartum 08/12/2019   History of IUFD 08/12/2019   Chronic hypertension with superimposed severe preeclampsia 11/06/2017   DM (diabetes mellitus), type 2 (HCC) 09/21/2016   Benign essential HTN 09/21/2016   BMI 34.0-34.9,adult 01/08/2013   Past  Medical History:  Diagnosis Date   Bacteremia 03/2018   approximately 10 days after vbac   Complication of anesthesia    intense itching after CS   Diabetes mellitus without complication (HCC)    T2   Hypertension    Palpitations    Past Surgical History:  Procedure Laterality Date   CESAREAN SECTION N/A 02/20/2014   Procedure: CESAREAN SECTION;  Surgeon: Lazaro Arms, MD;  Location: [facility];  Service: Obstetrics;  Laterality: N/A;   CESAREAN SECTION N/A 07/28/2016   Procedure: CESAREAN SECTION;  Surgeon: Catalina Antigua, MD;  Location: [facility];  Service: Obstetrics;  Laterality: N/A;   CESAREAN SECTION WITH BILATERAL TUBAL LIGATION Bilateral 02/11/2020   Procedure: CESAREAN SECTION WITH BILATERAL TUBAL LIGATION;  Surgeon: Catalina Antigua, MD;  Location: [facility];  Service: Obstetrics;  Laterality: Bilateral;   IR RADIOLOGY PERIPHERAL GUIDED IV START  01/21/2023   IR US GUIDE VASC ACCESS RIGHT  01/21/2023   Family History  Problem Relation Age of Onset   Diabetes Mellitus I Mother    Hypertension Mother    Diabetes Mother    Hypertension Father    Lung cancer Father    Cancer Maternal Grandmother    Cancer Maternal Grandfather    Cancer Paternal Grandmother    Cancer Paternal Grandfather    Early death Neg Hx    Heart disease Neg Hx    Hyperlipidemia Neg Hx    Kidney disease Neg Hx    Stroke Neg Hx    Social History   Socioeconomic History   Marital status: Married    Spouse name: Not on file   Number of children: Not on file   Years of education: Not on file   Highest education level: Not on file  Occupational History   Not on file  Tobacco Use   Smoking status: Former    Current packs/day: 0.00    Types: Cigars, Cigarettes    Quit date: 06/11/2009    Years since quitting: 14.2   Smokeless tobacco: Never   Tobacco comments:    not while pregnant  Vaping Use   Vaping status: Never Used  Substance and Sexual Activity   Alcohol use: Not Currently     Alcohol/week: 4.0 standard drinks of alcohol    Types: 2 Glasses of wine, 2 Cans of beer per week    Comment: occasional; stopped after found out she was pregnant   Drug use: Not Currently    Types: Marijuana    Comment: per prenatal record, +THC on initial visit u/a; no current use   Sexual activity: Yes    Partners: Male    Birth control/protection: None  Other Topics Concern   Not on file  Social History Narrative   Not on file   Social Drivers of Health   Financial Resource Strain: Low Risk  (03/20/2018)   Overall Financial Resource Strain (CARDIA)    Difficulty of Paying Living Expenses: Not hard at all  Food Insecurity: No Food Insecurity (03/15/2020)   Hunger Vital Sign    Worried About Running  Out of Food in the Last Year: Never true    Ran Out of Food in the Last Year: Never true  Transportation Needs: No Transportation Needs (03/15/2020)   PRAPARE - Administrator, Civil Service (Medical): No    Lack of Transportation (Non-Medical): No  Physical Activity: Insufficiently Active (03/20/2018)   Exercise Vital Sign    Days of Exercise per Week: 2 days    Minutes of Exercise per Session: 60 min  Stress: Stress Concern Present (03/20/2018)   [employer] of Occupational Health - Occupational Stress Questionnaire    Feeling of Stress : To some extent  Social Connections: Unknown (03/20/2018)   Social Connection and Isolation Panel [NHANES]    Frequency of Communication with Friends and Family: Not asked    Frequency of Social Gatherings with Friends and Family: Not asked    Attends Religious Services: Not on file    Active Member of Clubs or Organizations: Not on file    Attends Banker Meetings: Not on file    Marital Status: Not on file  Intimate Partner Violence: Not At Risk (01/13/2020)   Humiliation, Afraid, Rape, and Kick questionnaire    Fear of Current or Ex-Partner: No    Emotionally Abused: No    Physically Abused: No    Sexually  Abused: No   Outpatient Medications Prior to Visit  Medication Sig Dispense Refill   ACCU-CHEK FASTCLIX LANCETS MISC 1 Device by Percutaneous route 4 (four) times daily. 100 each 12   Blood Glucose Monitoring Suppl (ACCU-CHEK NANO SMARTVIEW) w/Device KIT 1 kit by Subdermal route as directed. Check blood sugars for fasting, and two hours after breakfast, lunch and dinner (4 checks daily) 1 kit 0   glucose blood (ACCU-CHEK SMARTVIEW) test strip Use as instructed to check blood sugars 120 each 12   glucose blood test strip Use as instructed 100 each 12   hydrocortisone cream 0.5 % Apply 1 application topically 2 (two) times daily as needed (itching/blemishes.).     levonorgestrel (MIRENA) 20 MCG/DAY IUD 1 each by Intrauterine route once. (21 mcg/24hr(upto 8 yrs)     metFORMIN (GLUCOPHAGE) 1000 MG tablet Take 1 tablet (1,000 mg total) by mouth 2 (two) times daily with a meal. 90 tablet 2   pravastatin (PRAVACHOL) 10 MG tablet Take 10 mg by mouth at bedtime.     amLODipine-olmesartan (AZOR) 10-40 MG tablet Take 1 tablet by mouth daily.     acetaminophen (TYLENOL) 500 MG tablet Take 2 tablets (1,000 mg total) by mouth every 6 (six) hours as needed. 30 tablet 0   butalbital-acetaminophen-caffeine (FIORICET) 50-325-40 MG tablet Take 1-2 tablets by mouth every 4 (four) hours as needed for headache. 30 tablet 0   enalapril (VASOTEC) 20 MG tablet Take 20 mg by mouth daily.     insulin glargine (LANTUS) 100 unit/mL SOPN Inject 0.3 mLs (30 Units total) into the skin at bedtime. 9 mL 0   NIFEdipine (PROCARDIA XL/NIFEDICAL XL) 60 MG 24 hr tablet Take 60 mg by mouth daily.     OZEMPIC, 2 MG/DOSE, 8 MG/3ML SOPN Inject 2 mg into the skin once a week.     Prenatal Vit-Fe Fumarate-FA (PREPLUS) 27-1 MG TABS Take 1 tablet by mouth daily. 30 tablet 13   No facility-administered medications prior to visit.   No Known Allergies   ROS: see HPI     Objective   Today's Vitals   09/24/23 0845 09/24/23 0929  BP:  (!) 145/97 127/85  Pulse: 89   Resp: 16   Temp: 98.3 F (36.8 C)   TempSrc: Oral   SpO2: 96%   Weight: 215 lb 12.8 oz (97.9 kg)   Height: 6' 0.05" (1.83 m)   PainSc: 0-No pain    GENERAL: Well-appearing, in NAD. Well nourished.  SKIN: Pink, warm and dry. No rash, lesion, ulceration, or ecchymoses.  Head: Normocephalic. NECK: Trachea midline. Full ROM w/o pain or tenderness. No lymphadenopathy.  EARS: Tympanic membranes are intact, translucent without bulging and without drainage. Appropriate landmarks visualized.  EYES: Conjunctiva clear without exudates. EOMI, PERRL, no drainage present.  NOSE: Septum midline w/o deformity. Nares patent, mucosa pink and non-inflamed w/o drainage. No sinus tenderness.  THROAT: Uvula midline. Oropharynx clear. Tonsils non-inflamed without exudate. Mucous membranes pink and moist.  RESPIRATORY: Chest wall symmetrical. Respirations even and non-labored. Breath sounds clear to auscultation bilaterally.  CARDIAC: S1, S2 present, regular rate and rhythm without murmur or gallops. Peripheral pulses 2+ bilaterally.  MSK: Muscle tone and strength appropriate for age. Joints w/o tenderness, redness, or swelling.  EXTREMITIES: Without clubbing, cyanosis, or edema.  NEUROLOGIC: No motor or sensory deficits. Steady, even gait. C2-C12 intact.  PSYCH/MENTAL STATUS: Alert, oriented x 3. Cooperative, appropriate mood and affect.     Assessment & Plan:   1. Encounter to establish care (Primary) Patient is a 15- year-old female who presents today to establish care with primary care at [facility]. Reviewed the past medical history, family history, social history, surgical history, medications and allergies today- updates made as indicated. Patient does not have acute concerns today and is requesting management of her chronic conditions.  2. Type 2 diabetes mellitus with other specified complication, with long-term current use of insulin (HCC) Patient presents today with  diagnosis of type II diabetes mellitus with previous long-term use of insulin. She has then been switched from Lantus 30mg  at bedtime to Ozempic 2mg  weekly. Reports intermittent constipation and denies other common side effects. Will obtain records from [facility]- who was previously managing her diabetes. POCT hemoglobin A1c today 6.5%. Will check CMP and urine microalbumin/creatinine ratio. Refills provided. Counseled patient to continue checking fasting blood sugars occasionally.  - metFORMIN (GLUCOPHAGE) 500 MG tablet; Take 1 tablet (500 mg total) by mouth 2 (two) times daily with a meal.  Dispense: 60 tablet; Refill: 2 - pravastatin (PRAVACHOL) 10 MG tablet; Take 1 tablet (10 mg total) by mouth at bedtime.  Dispense: 30 tablet; Refill: 2 - OZEMPIC, 2 MG/DOSE, 8 MG/3ML SOPN; Inject 2 mg into the skin once a week.  Dispense: 3 mL; Refill: PRN - Urine Albumin/Creatinine with ratio (send out) [LAB689] - Comprehensive metabolic panel with eGFR (no eGFR if sent to [facility]) [LAB17] - POCT glycosylated hemoglobin (Hb A1C)  3. Primary hypertension Patient presents today with slightly elevated blood pressure, repeat blood pressure improved. Patient in no acute distress and is well-appearing. Denies chest pain, shortness of breath, lower extremity edema, vision changes, headaches. Cardiovascular exam with heart regular rate and rhythm. Normal heart sounds, no murmurs present. No lower extremity edema present. Lungs clear to auscultation bilaterally. Patient is currently taking amlodipine-olmesartan 10-40mg  daily. No refills needed today. Advised patient to closely monitor blood pressure at home and return to office sooner if blood pressure begins to increase greater than 130/80.   Return in about 3 months (around 12/24/2023) for HTN follow-up, Diabetes f/u.   Alyson Reedy, FNP

## 2023-09-27 ENCOUNTER — Encounter: Payer: Self-pay | Admitting: Family Medicine

## 2023-09-30 ENCOUNTER — Telehealth: Payer: Self-pay

## 2023-09-30 NOTE — Telephone Encounter (Signed)
 Copied from CRM [id number]. Topic: Clinical - Prescription Issue >> Sep 30, 2023  3:04 PM Star [place] wrote: Reason for CRM: OZEMPIC , 2 MG/DOSE, 8 MG/3ML SOPN too expensive through new insurance- need to know if prior Siegfried Dress was requested or if another medication will be given- [phone]

## 2023-09-30 NOTE — Telephone Encounter (Signed)
 Provider has been made aware

## 2023-10-01 ENCOUNTER — Other Ambulatory Visit: Payer: Self-pay | Admitting: Family Medicine

## 2023-10-01 LAB — COMPREHENSIVE METABOLIC PANEL WITH GFR

## 2023-10-01 LAB — MICROALBUMIN / CREATININE URINE RATIO
Creatinine, Urine: 98.5 mg/dL
Microalb/Creat Ratio: <3 mg/g{creat} (ref 0–29)
Microalbumin, Urine: <3 ug/mL

## 2023-10-01 LAB — SPECIMEN STATUS REPORT

## 2023-10-01 NOTE — Progress Notes (Deleted)
 m

## 2023-10-02 ENCOUNTER — Other Ambulatory Visit: Payer: Self-pay | Admitting: Family Medicine

## 2023-10-02 DIAGNOSIS — Z794 Long term (current) use of insulin: Secondary | ICD-10-CM

## 2023-10-02 MED ORDER — OZEMPIC (2 MG/DOSE) 8 MG/3ML ~~LOC~~ SOPN
2.0000 mg | PEN_INJECTOR | SUBCUTANEOUS | 99 refills | Status: AC
Start: 2023-10-02 — End: ?

## 2023-10-03 ENCOUNTER — Telehealth: Payer: Self-pay | Admitting: Family Medicine

## 2023-10-03 NOTE — Telephone Encounter (Signed)
 Copied from CRM [id number]. Topic: Clinical - Prescription Issue >> Sep 30, 2023  3:04 PM Star East wrote: Reason for CRM: OZEMPIC , 2 MG/DOSE, 8 MG/3ML SOPN too expensive through new insurance- need to know if prior Siegfried Dress was requested or if another medication will be given- [phone] >> Oct 03, 2023  4:11 PM Fredrica W wrote: Patient called to check status of request. States she has not heard anything back.  Let her know per notes that provider has been made aware and she will be contacted once here is an update from the insurance. She wanted to know if it has been started. Thank You

## 2023-10-04 NOTE — Telephone Encounter (Signed)
 Message from Plan  The request has been approved. The authorization is effective from 10/04/2023 to 10/03/2024, as long as the member is enrolled in their current health plan.   This has been approved for a max daily dosage of 0.11. A written notification letter will follow with additional details.  Authorization Expiration Date: October 03, 2024.

## 2023-10-04 NOTE — Telephone Encounter (Signed)
(  Key: [id number])  Ozempic  (2 MG/DOSE) 8MG /3ML pen-injectors  Form MedImpact ePA Form 2017 NCPDP

## 2023-10-11 ENCOUNTER — Encounter: Payer: Self-pay | Admitting: Family Medicine

## 2023-11-21 ENCOUNTER — Ambulatory Visit: Admitting: Physician Assistant

## 2023-12-11 ENCOUNTER — Ambulatory Visit: Admitting: Family Medicine

## 2023-12-24 ENCOUNTER — Ambulatory Visit: Admitting: Family Medicine

## 2024-03-03 ENCOUNTER — Ambulatory Visit: Admitting: Family Medicine

## 2024-03-03 VITALS — BP 134/82 | HR 88 | Temp 98.4°F | Ht 70.0 in | Wt 204.5 lb

## 2024-03-03 DIAGNOSIS — Z13220 Encounter for screening for lipoid disorders: Secondary | ICD-10-CM

## 2024-03-03 DIAGNOSIS — E1169 Type 2 diabetes mellitus with other specified complication: Secondary | ICD-10-CM

## 2024-03-03 DIAGNOSIS — B351 Tinea unguium: Secondary | ICD-10-CM

## 2024-03-03 DIAGNOSIS — Z23 Encounter for immunization: Secondary | ICD-10-CM

## 2024-03-03 DIAGNOSIS — I10 Essential (primary) hypertension: Secondary | ICD-10-CM | POA: Diagnosis not present

## 2024-03-03 DIAGNOSIS — F4329 Adjustment disorder with other symptoms: Secondary | ICD-10-CM

## 2024-03-03 DIAGNOSIS — E559 Vitamin D deficiency, unspecified: Secondary | ICD-10-CM

## 2024-03-03 DIAGNOSIS — E1159 Type 2 diabetes mellitus with other circulatory complications: Secondary | ICD-10-CM

## 2024-03-03 DIAGNOSIS — E785 Hyperlipidemia, unspecified: Secondary | ICD-10-CM

## 2024-03-03 DIAGNOSIS — L309 Dermatitis, unspecified: Secondary | ICD-10-CM | POA: Diagnosis not present

## 2024-03-03 DIAGNOSIS — F419 Anxiety disorder, unspecified: Secondary | ICD-10-CM

## 2024-03-03 DIAGNOSIS — O119 Pre-existing hypertension with pre-eclampsia, unspecified trimester: Secondary | ICD-10-CM

## 2024-03-03 DIAGNOSIS — Z975 Presence of (intrauterine) contraceptive device: Secondary | ICD-10-CM

## 2024-03-03 DIAGNOSIS — I152 Hypertension secondary to endocrine disorders: Secondary | ICD-10-CM

## 2024-03-03 MED ORDER — ACCU-CHEK FASTCLIX LANCETS MISC
1.0000 | Freq: Four times a day (QID) | 12 refills | Status: AC
Start: 2024-03-03 — End: ?

## 2024-03-03 MED ORDER — BUSPIRONE HCL 7.5 MG PO TABS: 7.5000 mg | ORAL_TABLET | Freq: Two times a day (BID) | ORAL | 3 refills | Status: DC

## 2024-03-03 MED ORDER — ACCU-CHEK SMARTVIEW VI STRP
ORAL_STRIP | 12 refills | Status: DC
Start: 2024-03-03 — End: 2024-03-06

## 2024-03-03 MED ORDER — HYDROCORTISONE 0.5 % EX CREA
1.0000 | TOPICAL_CREAM | Freq: Two times a day (BID) | CUTANEOUS | 1 refills | Status: AC | PRN
Start: 2024-03-03 — End: ?

## 2024-03-03 NOTE — Assessment & Plan Note (Signed)
 Type 2 diabetes mellitus Chronic condition  Type 2 diabetes mellitus, managed with metformin  500 mg twice daily and Ozempic  2 mg once weekly. A1c was previously elevated, contributing to complications in past pregnancies. - Order A1c and CMP to assess diabetes control and management - Refer to podiatry for onychomycosis management - Recommend fasting blood glucose monitoring using Accu-Check test strips and lancets - continue ozempic  2mg  weekly and metformin  500mg  twice daily

## 2024-03-03 NOTE — Assessment & Plan Note (Signed)
  Chronic anxiety present for almost a year, initiated after her father's passing. She is hesitant to start medication due to family history of mental health issues but is open to trying buspirone  as needed. She is considering both individual and couples counseling to address anxiety and marital concerns. - Prescribe buspirone  7.5mg  BID as needed for anxiety - Encourage scheduling individual and couples counseling appointments, pt plans to schedule appts

## 2024-03-03 NOTE — Assessment & Plan Note (Signed)
 Chronic hypertension Chronic hypertension, managed with amlodipine 10 mg daily and olmesartan 40 mg daily. Blood pressure was elevated during the visit, but she took her medication an hour prior to the appointment. BP IMPROVED on recheck  -continue amlodipine-olmesartan 10-40mg  daily  - Order CMP for hypertension management - Recheck blood pressure during the visit

## 2024-03-03 NOTE — Assessment & Plan Note (Signed)
 Onychomycosis Based on pt hx  Onychomycosis of multiple toenails, associated with type 2 diabetes. - Refer to podiatry for onychomycosis management

## 2024-03-03 NOTE — Patient Instructions (Signed)
 To keep you healthy, please keep in mind the following health maintenance items that you are due for:   Health Maintenance Due  Topic Date Due   OPHTHALMOLOGY EXAM  08/13/2013   Influenza Vaccine  01/10/2024     Best Wishes,   Dr. Lang

## 2024-03-03 NOTE — Assessment & Plan Note (Signed)
  Hyperlipidemia Chronic  Chronic hyperlipidemia associated with type 2 diabetes, managed with pravastatin  10 mg daily. - Order lipid panel for hyperlipidemia management -continue pravastatin  10mg  daily

## 2024-03-03 NOTE — Assessment & Plan Note (Signed)
 Vitamin D deficiency Chronic Vitamin D deficiency, managed with weekly vitamin D supplementation. -continue Vitamin D 50,000 units weekly

## 2024-03-03 NOTE — Assessment & Plan Note (Signed)
 IUD in place for management of menses

## 2024-03-03 NOTE — Progress Notes (Signed)
 New patient visit   Patient: Joyce Green   DOB: 07-Jun-1986   37 y.o. Female  MRN: [id number] Visit Date: 03/03/2024  Today's healthcare provider: Rockie Agent, MD   Chief Complaint  Patient presents with   Establish Care    Patient is present to establish care with new pcp. Is not currently seeing specialist, would like to be referred to podiatry for a toenail fungus. Eating a diet with smaller portions due to ozempic , veggies but less on fruits. Not currently exercising due to being in school    Anxiety    Patient has been experiencing anxiety/ depression symptoms since December of 2024 when her father passed. Would like to discuss this today    Subjective    Joyce Green is a 38 y.o. female who presents today as a new patient to establish care.   HPI     Establish Care    Additional comments: Patient is present to establish care with new pcp. Is not currently seeing specialist, would like to be referred to podiatry for a toenail fungus. Eating a diet with smaller portions due to ozempic , veggies but less on fruits. Not currently exercising due to being in school         Anxiety    Additional comments: Patient has been experiencing anxiety/ depression symptoms since December of 2024 when her father passed. Would like to discuss this today       Last edited by Cherry Chiquita HERO, CMA on 03/03/2024 10:46 AM.       Discussed the use of AI scribe software for clinical note transcription with the patient, who gave verbal consent to proceed.  History of Present Illness Joyce Green is a 38 year old female who presents to establish care as a new patient.  She has a history of chronic hypertension, currently managed with amlodipine 10 mg daily and olmesartan 40 mg daily.  Her type 2 diabetes was diagnosed during her pregnancy in 2015. Initially managed with metformin  and insulin , she is now on metformin  500 mg twice daily and Ozempic  2 mg once  weekly. She monitors her blood sugar using Accu-Check test strips and lancets but is not currently on insulin .  She experiences chronic anxiety that began almost a year ago following her father's death from heart disease. This has had a significant emotional impact, including stress and anxiety, exacerbated by multiple family losses and personal stressors. She has not been on medication for anxiety.  She has hyperlipidemia associated with her diabetes, managed with pravastatin  10 mg daily.  She has a history of De Quervain's tenosynovitis and onychomycosis of multiple toenails, the latter associated with her diabetes.  She occasionally smokes cigars and delta-8 e-cigarettes. She has a history of heavy smoking but has reduced due to health concerns.  Her family history is significant for her father's death from heart disease and a family history of mental health issues, with her brother currently in a behavioral unit.  She has a Mirena  IUD for heavy menstrual bleeding, which has helped reduce the severity of her symptoms.       03/03/2024   11:41 AM 09/24/2023    8:43 AM 03/15/2020   11:03 AM 02/18/2020   10:16 AM  GAD 7 : Generalized Anxiety Score  Nervous, Anxious, on Edge 2 0 0 1  Control/stop worrying 2 1 0 0  Worry too much - different things 2 2 0 0  Trouble relaxing 0 0 0 0  Restless  0 0 0 0  Easily annoyed or irritable 2 2 0 0  Afraid - awful might happen 0 0 0 0  Total GAD 7 Score 8 5 0 1  Anxiety Difficulty Not difficult at all Somewhat difficult Not difficult at all       Past Medical History:  Diagnosis Date   Anxiety    Bacteremia 03/2018   approximately 10 days after vbac   Chronic hypertension with superimposed severe preeclampsia 11/06/2017   Stopped lisinopril  in mid January 2021 when she found out she was pregnant     [ ]  Aspirin  81 mg daily after 12 weeks; discontinue after 36 weeks  Current antihypertensives:  Labetalol       Baseline and surveillance labs  (pulled in from [facility], refresh links as needed)           Lab Results      Component    Value    Date           PLT    213    11/06/2017           CREATININE    0.70    11/06/2017    Complication of anesthesia    intense itching after CS   Depression    Diabetes mellitus without complication (HCC)    T2   History of IUFD 08/12/2019   March 2020: negative anti-phospholipid antibody screen, anti cardiolipin igg/igm, anti beta 2 glycoprotein igg/igm and lupus anti-coagulant, tsh.   03-2018: at 33wks, showed up for routine antenatal testing and dx then. anora testing negative, torch titers negative. Placenta showed mild chorio.         History of VBAC 08/26/2019   03/2018: 33wk IUFD IOL SVD after two prior term c-sections     Hypertension    Palpitations     Outpatient Medications Prior to Visit  Medication Sig   amLODipine-olmesartan (AZOR) 10-40 MG tablet Take 1 tablet by mouth daily.   Blood Glucose Monitoring Suppl (ACCU-CHEK NANO SMARTVIEW) w/Device KIT 1 kit by Subdermal route as directed. Check blood sugars for fasting, and two hours after breakfast, lunch and dinner (4 checks daily)   ergocalciferol (VITAMIN D2) 1.25 MG (50000 UT) capsule Take 50,000 Units by mouth once a week.   levonorgestrel  (MIRENA ) 20 MCG/DAY IUD 1 each by Intrauterine route once. (21 mcg/24hr(upto 8 yrs)   metFORMIN  (GLUCOPHAGE ) 500 MG tablet Take 1 tablet (500 mg total) by mouth 2 (two) times daily with a meal.   OZEMPIC , 2 MG/DOSE, 8 MG/3ML SOPN Inject 2 mg into the skin once a week.   pravastatin  (PRAVACHOL ) 10 MG tablet Take 1 tablet (10 mg total) by mouth at bedtime.   [DISCONTINUED] ACCU-CHEK FASTCLIX LANCETS MISC 1 Device by Percutaneous route 4 (four) times daily.   [DISCONTINUED] glucose blood (ACCU-CHEK SMARTVIEW) test strip Use as instructed to check blood sugars   [DISCONTINUED] hydrocortisone  cream 0.5 % Apply 1 application topically 2 (two) times daily as needed (itching/blemishes.).   [DISCONTINUED]  glucose blood test strip Use as instructed   No facility-administered medications prior to visit.    Past Surgical History:  Procedure Laterality Date   CESAREAN SECTION N/A 02/20/2014   Procedure: CESAREAN SECTION;  Surgeon: Vonn VEAR Inch, MD;  Location: [facility];  Service: Obstetrics;  Laterality: N/A;   CESAREAN SECTION N/A 07/28/2016   Procedure: CESAREAN SECTION;  Surgeon: Winton Felt, MD;  Location: [facility];  Service: Obstetrics;  Laterality: N/A;   CESAREAN SECTION WITH BILATERAL  TUBAL LIGATION Bilateral 02/11/2020   Procedure: CESAREAN SECTION WITH BILATERAL TUBAL LIGATION;  Surgeon: Alger Gong, MD;  Location: [facility];  Service: Obstetrics;  Laterality: Bilateral;   IR RADIOLOGY PERIPHERAL GUIDED IV START  01/21/2023   IR US  GUIDE VASC ACCESS RIGHT  01/21/2023   TUBAL LIGATION     Family Status  Relation Name Status   Mother Sebastian Alive   Father Lang Alive   Sister Tequila Alive   Brother Fonda Alive   MGM  (Not Specified)   MGF  (Not Specified)   PGM  (Not Specified)   PGF  (Not Specified)   Neg Hx  (Not Specified)  No partnership data on file   Family History  Problem Relation Age of Onset   Diabetes Mellitus I Mother    Hypertension Mother    Diabetes Mother    Anxiety disorder Mother    Depression Mother    Hypertension Father    Lung cancer Father    COPD Father    Asthma Sister    Depression Brother    Cancer Maternal Grandmother    Cancer Maternal Grandfather    Cancer Paternal Grandmother    Cancer Paternal Grandfather    Early death Neg Hx    Heart disease Neg Hx    Hyperlipidemia Neg Hx    Kidney disease Neg Hx    Stroke Neg Hx    Social History   Socioeconomic History   Marital status: Married    Spouse name: Not on file   Number of children: Not on file   Years of education: Not on file   Highest education level: Bachelor's degree (e.g., BA, AB, BS)  Occupational History   Not on file  Tobacco Use   Smoking  status: Some Days    Types: Cigars   Smokeless tobacco: Never   Tobacco comments:    Occasionally delta 8 e-cigarettes   Vaping Use   Vaping status: Never Used  Substance and Sexual Activity   Alcohol use: Yes    Alcohol/week: 2.0 standard drinks of alcohol    Types: 2 Glasses of wine per week    Comment: Occasionally/ social per pt   Drug use: Not Currently    Types: Marijuana    Comment: per prenatal record, +THC on initial visit u/a; no current use   Sexual activity: Yes    Partners: Male    Birth control/protection: I.U.D., None  Other Topics Concern   Not on file  Social History Narrative   Not on file   Social Drivers of Health   Financial Resource Strain: Low Risk  (03/03/2024)   Overall Financial Resource Strain (CARDIA)    Difficulty of Paying Living Expenses: Not hard at all  Food Insecurity: No Food Insecurity (03/03/2024)   Hunger Vital Sign    Worried About Running Out of Food in the Last Year: Never true    Ran Out of Food in the Last Year: Never true  Transportation Needs: No Transportation Needs (03/03/2024)   PRAPARE - Administrator, Civil Service (Medical): No    Lack of Transportation (Non-Medical): No  Physical Activity: Inactive (03/03/2024)   Exercise Vital Sign    Days of Exercise per Week: 0 days    Minutes of Exercise per Session: Not on file  Stress: No Stress Concern Present (03/03/2024)   [employer] of Occupational Health - Occupational Stress Questionnaire    Feeling of Stress: Only a little  Social Connections: Socially  Integrated (03/03/2024)   Social Connection and Isolation Panel    Frequency of Communication with Friends and Family: More than three times a week    Frequency of Social Gatherings with Friends and Family: Once a week    Attends Religious Services: More than 4 times per year    Active Member of Clubs or Organizations: Yes    Attends Banker Meetings: More than 4 times per year    Marital Status:  Married     No Known Allergies  Immunization History  Administered Date(s) Administered   Influenza Inj Mdck Quad With Preservative 04/25/2020   Influenza, Mdck, Trivalent,PF 6+ MOS(egg free) 03/16/2023   Influenza, Seasonal, Injecte, Preservative Fre 03/03/2024   Influenza,inj,Quad PF,6+ Mos 02/24/2016, 03/12/2019, 03/08/2022   Influenza-Unspecified 02/19/2018   PFIZER(Purple Top)SARS-COV-2 Vaccination 02/22/2020, 03/14/2020   Tdap 12/04/2013, 05/21/2016, 12/17/2019    Health Maintenance  Topic Date Due   OPHTHALMOLOGY EXAM  08/13/2013   COVID-19 Vaccine (3 - 2025-26 season) 03/19/2024 (Originally 02/10/2024)   Pneumococcal Vaccine (1 of 2 - PCV) 03/03/2025 (Originally 06/10/2005)   HPV VACCINES (1 - Risk 3-dose SCDM series) 03/03/2025 (Originally 06/10/2013)   HEMOGLOBIN A1C  03/25/2024   Diabetic kidney evaluation - eGFR measurement  09/23/2024   Diabetic kidney evaluation - Urine ACR  09/23/2024   FOOT EXAM  03/03/2025   Cervical Cancer Screening (HPV/Pap Cotest)  03/15/2025   DTaP/Tdap/Td (4 - Td or Tdap) 12/16/2029   Influenza Vaccine  Completed   Hepatitis C Screening  Completed   HIV Screening  Completed   Meningococcal B Vaccine  Aged Out   Hepatitis B Vaccines 19-59 Average Risk  Discontinued    Patient Care Team: Sharma Coyer, MD as PCP - General (Family Medicine)  Review of Systems      Objective    BP 134/82 (Cuff Size: Normal)   Pulse 88   Temp 98.4 F (36.9 C) (Oral)   Ht 5' 10 (1.778 m)   Wt 204 lb 8 oz (92.8 kg)   LMP 02/22/2024 (Approximate)   SpO2 100%   BMI 29.34 kg/m      Depression Screen    03/03/2024   11:40 AM 09/24/2023    8:43 AM 03/15/2020   11:03 AM 02/18/2020   10:16 AM  PHQ 2/9 Scores  PHQ - 2 Score 3 0 0 0  PHQ- 9 Score 4 1 0 0   No results found for any visits on 03/03/24.   Physical Exam Vitals reviewed.  Constitutional:      General: She is not in acute distress.    Appearance: Normal appearance.  She is not ill-appearing.  Cardiovascular:     Rate and Rhythm: Normal rate and regular rhythm.  Pulmonary:     Effort: Pulmonary effort is normal. No respiratory distress.     Breath sounds: No wheezing, rhonchi or rales.  Neurological:     Mental Status: She is alert and oriented to person, place, and time.  Psychiatric:        Mood and Affect: Mood is anxious. Affect is tearful.        Speech: Speech normal. She is communicative. Speech is not rapid and pressured.        Behavior: Behavior normal. Behavior is not agitated, slowed, aggressive or withdrawn. Behavior is cooperative.        Cognition and Memory: Cognition normal.        Assessment & Plan      Problem List Items Addressed This  Visit     Anxiety    Chronic anxiety present for almost a year, initiated after her father's passing. She is hesitant to start medication due to family history of mental health issues but is open to trying buspirone  as needed. She is considering both individual and couples counseling to address anxiety and marital concerns. - Prescribe buspirone  7.5mg  BID as needed for anxiety - Encourage scheduling individual and couples counseling appointments, pt plans to schedule appts      Relevant Medications   busPIRone  (BUSPAR ) 7.5 MG tablet   RESOLVED: Chronic hypertension with superimposed severe preeclampsia   Relevant Orders   Comprehensive Metabolic Panel (CMET)   Hyperlipidemia associated with type 2 diabetes mellitus (HCC)    Hyperlipidemia Chronic  Chronic hyperlipidemia associated with type 2 diabetes, managed with pravastatin  10 mg daily. - Order lipid panel for hyperlipidemia management -continue pravastatin  10mg  daily       Hypertension associated with diabetes (HCC)   Chronic hypertension Chronic hypertension, managed with amlodipine 10 mg daily and olmesartan 40 mg daily. Blood pressure was elevated during the visit, but she took her medication an hour prior to the appointment.  BP IMPROVED on recheck  -continue amlodipine-olmesartan 10-40mg  daily  - Order CMP for hypertension management - Recheck blood pressure during the visit      IUD (intrauterine device) in place   IUD in place for management of menses       Onychomycosis of multiple toenails with type 2 diabetes mellitus (HCC)   Onychomycosis Based on pt hx  Onychomycosis of multiple toenails, associated with type 2 diabetes. - Refer to podiatry for onychomycosis management      Relevant Orders   Ambulatory referral to Podiatry   Type 2 diabetes mellitus with hyperglycemia (HCC) - Primary   Type 2 diabetes mellitus Chronic condition  Type 2 diabetes mellitus, managed with metformin  500 mg twice daily and Ozempic  2 mg once weekly. A1c was previously elevated, contributing to complications in past pregnancies. - Order A1c and CMP to assess diabetes control and management - Refer to podiatry for onychomycosis management - Recommend fasting blood glucose monitoring using Accu-Check test strips and lancets - continue ozempic  2mg  weekly and metformin  500mg  twice daily       Relevant Medications   Accu-Chek FastClix Lancets MISC   glucose blood (ACCU-CHEK SMARTVIEW) test strip   Vitamin D deficiency   Vitamin D deficiency Chronic Vitamin D deficiency, managed with weekly vitamin D supplementation. -continue Vitamin D 50,000 units weekly       Other Visit Diagnoses       Screening for lipid disorders       Relevant Orders   Lipid panel     Immunization due       Relevant Orders   Flu vaccine trivalent PF, 6mos and older(Flulaval,Afluria,Fluarix,Fluzone) (Completed)     Dermatitis       Relevant Medications   hydrocortisone  cream 0.5 %     Grief reaction with prolonged bereavement            Assessment & Plan   Chronic dermatitis Chronic dermatitis suspected, presenting with itching. - Prescribe hydrocortisone  cream 0.5% as needed for itching   Tobacco use Occasional use of  cigars and delta-8 e-cigarettes. She expresses a desire to reduce use due to family history of lung cancer. - recommended smoking cessation   General Health Maintenance Influenza vaccination is due. - Administered influenza vaccine during the visit    Health Maintenance  [facility]  last exam within the last year  - [facility] in [place] previously   Return in about 6 weeks (around 04/14/2024) for Anxiety, HTN.      Rockie Agent, MD  [facility] [phone] (phone) [phone] (fax)  [facility]

## 2024-03-04 LAB — COMPREHENSIVE METABOLIC PANEL WITH GFR
ALT: 10 IU/L (ref 0–32)
AST: 13 IU/L (ref 0–40)
Albumin: 4.5 g/dL (ref 3.9–4.9)
Alkaline Phosphatase: 89 IU/L (ref 41–116)
BUN/Creatinine Ratio: 14 (ref 9–23)
BUN: 11 mg/dL (ref 6–20)
Bilirubin Total: 0.3 mg/dL (ref 0.0–1.2)
CO2: 21 mmol/L (ref 20–29)
Calcium: 9.2 mg/dL (ref 8.7–10.2)
Chloride: 104 mmol/L (ref 96–106)
Creatinine, Ser: 0.8 mg/dL (ref 0.57–1.00)
Globulin, Total: 3.2 g/dL (ref 1.5–4.5)
Glucose: 130 mg/dL — ABNORMAL HIGH (ref 70–99)
Potassium: 4.5 mmol/L (ref 3.5–5.2)
Sodium: 140 mmol/L (ref 134–144)
Total Protein: 7.7 g/dL (ref 6.0–8.5)
eGFR: 97 mL/min/1.73 (ref >59)

## 2024-03-04 LAB — LIPID PANEL
Chol/HDL Ratio: 2.4 ratio (ref 0.0–4.4)
Cholesterol, Total: 107 mg/dL (ref 100–199)
HDL: 44 mg/dL (ref >39)
LDL Chol Calc (NIH): 53 mg/dL (ref 0–99)
Triglycerides: 39 mg/dL (ref 0–149)
VLDL Cholesterol Cal: 10 mg/dL (ref 5–40)

## 2024-03-04 LAB — HEMOGLOBIN A1C
Est. average glucose Bld gHb Est-mCnc: 134 mg/dL
Hgb A1c MFr Bld: 6.3 % — ABNORMAL HIGH (ref 4.8–5.6)

## 2024-03-06 ENCOUNTER — Other Ambulatory Visit: Payer: Self-pay | Admitting: Family Medicine

## 2024-03-06 ENCOUNTER — Ambulatory Visit: Payer: Self-pay | Admitting: Family Medicine

## 2024-03-06 DIAGNOSIS — E1169 Type 2 diabetes mellitus with other specified complication: Secondary | ICD-10-CM

## 2024-03-06 MED ORDER — ACCU-CHEK SMARTVIEW VI STRP: ORAL_STRIP | 12 refills | Status: AC

## 2024-03-30 ENCOUNTER — Other Ambulatory Visit: Payer: Self-pay | Admitting: Family Medicine

## 2024-03-30 DIAGNOSIS — F419 Anxiety disorder, unspecified: Secondary | ICD-10-CM

## 2024-04-21 ENCOUNTER — Ambulatory Visit: Admitting: Podiatry

## 2024-04-21 DIAGNOSIS — B351 Tinea unguium: Secondary | ICD-10-CM | POA: Diagnosis not present

## 2024-04-21 DIAGNOSIS — Z79899 Other long term (current) drug therapy: Secondary | ICD-10-CM

## 2024-04-21 NOTE — Progress Notes (Signed)
 Subjective:  Patient ID: Joyce Green, female    DOB: [birth year],  MRN: [id number]  Chief Complaint  Patient presents with   Nail Problem    Bilateral hallux nail discoloration     38 y.o. female presents with the above complaint.  Patient presents with bilateral hallux thickened elongated dystrophic mycotic toenails x 2.  She wishes to discuss treatment options for nail fungus she has been doing this for quite some time wanted to get it evaluated.  She has not tried anything for it.  Denies any other acute complaints.   Review of Systems: Negative except as noted in the HPI. Denies N/V/F/Ch.  Past Medical History:  Diagnosis Date   Anxiety    Bacteremia 03/2018   approximately 10 days after vbac   Chronic hypertension with superimposed severe preeclampsia 11/06/2017   Stopped lisinopril  in mid January 2021 when she found out she was pregnant     [ ]  Aspirin  81 mg daily after 12 weeks; discontinue after 36 weeks  Current antihypertensives:  Labetalol       Baseline and surveillance labs (pulled in from [facility], refresh links as needed)           Lab Results      Component    Value    Date           PLT    213    11/06/2017           CREATININE    0.70    11/06/2017    Complication of anesthesia    intense itching after CS   Depression    Diabetes mellitus without complication (HCC)    T2   History of IUFD 08/12/2019   March 2020: negative anti-phospholipid antibody screen, anti cardiolipin igg/igm, anti beta 2 glycoprotein igg/igm and lupus anti-coagulant, tsh.   03-2018: at 33wks, showed up for routine antenatal testing and dx then. anora testing negative, torch titers negative. Placenta showed mild chorio.         History of VBAC 08/26/2019   03/2018: 33wk IUFD IOL SVD after two prior term c-sections     Hypertension    Palpitations     Current Outpatient Medications:    Accu-Chek FastClix Lancets MISC, 1 Device by Percutaneous route 4 (four) times daily., Disp: 100 each,  Rfl: 12   amLODipine-olmesartan (AZOR) 10-40 MG tablet, Take 1 tablet by mouth daily., Disp: , Rfl:    Blood Glucose Monitoring Suppl (ACCU-CHEK NANO SMARTVIEW) w/Device KIT, 1 kit by Subdermal route as directed. Check blood sugars for fasting, and two hours after breakfast, lunch and dinner (4 checks daily), Disp: 1 kit, Rfl: 0   busPIRone  (BUSPAR ) 7.5 MG tablet, TAKE 1 TABLET BY MOUTH 2 TIMES DAILY., Disp: 180 tablet, Rfl: 2   ergocalciferol (VITAMIN D2) 1.25 MG (50000 UT) capsule, Take 50,000 Units by mouth once a week., Disp: , Rfl:    glucose blood (ACCU-CHEK SMARTVIEW) test strip, Please use to testing fasting morning blood sugars and 2 hours after largest meal of the day (twice daily), Disp: 120 each, Rfl: 12   hydrocortisone  cream 0.5 %, Apply 1 Application topically 2 (two) times daily as needed (itching/blemishes.)., Disp: 30 g, Rfl: 1   levonorgestrel  (MIRENA ) 20 MCG/DAY IUD, 1 each by Intrauterine route once. (21 mcg/24hr(upto 8 yrs), Disp: , Rfl:    metFORMIN  (GLUCOPHAGE ) 500 MG tablet, Take 1 tablet (500 mg total) by mouth 2 (two) times daily with a meal., Disp: 60 tablet, Rfl: 2  OZEMPIC , 2 MG/DOSE, 8 MG/3ML SOPN, Inject 2 mg into the skin once a week., Disp: 3 mL, Rfl: PRN   pravastatin  (PRAVACHOL ) 10 MG tablet, Take 1 tablet (10 mg total) by mouth at bedtime., Disp: 30 tablet, Rfl: 2  Social History   Tobacco Use  Smoking Status Some Days   Types: Cigars  Smokeless Tobacco Never  Tobacco Comments   Occasionally delta 8 e-cigarettes     No Known Allergies Objective:  There were no vitals filed for this visit. There is no height or weight on file to calculate BMI. Constitutional Well developed. Well nourished.  Vascular Dorsalis pedis pulses palpable bilaterally. Posterior tibial pulses palpable bilaterally. Capillary refill normal to all digits.  No cyanosis or clubbing noted. Pedal hair growth normal.  Neurologic Normal speech. Oriented to person, place, and  time. Epicritic sensation to light touch grossly present bilaterally.  Dermatologic Nails thickened onychodystrophy mycotic toenails x 2 Skin within normal limits  Orthopedic: Normal joint ROM without pain or crepitus bilaterally. No visible deformities. No bony tenderness.   Radiographs: None Assessment:   1. Long-term use of high-risk medication   2. Nail fungus   3. Onychomycosis due to dermatophyte    Plan:  Patient was evaluated and treated and all questions answered.  Bilateral hallux onychomycosis -Educated the patient on the etiology of onychomycosis and various treatment options associated with improving the fungal load.  I explained to the patient that there is 3 treatment options available to treat the onychomycosis including topical, p.o., laser treatment.  Patient elected to undergo p.o. options with Lamisil/terbinafine therapy.  In order for me to start the medication therapy, I explained to the patient the importance of evaluating the liver and obtaining the liver function test.  Once the liver function test comes back normal I will start him on 51-month course of Lamisil therapy.  Patient understood all risk and would like to proceed with Lamisil therapy.  I have asked the patient to immediately stop the Lamisil therapy if she has any reactions to it and call the office or go to the emergency room right away.  Patient states understanding   No follow-ups on file.

## 2024-04-22 LAB — HEPATIC FUNCTION PANEL
ALT: 10 IU/L (ref 0–32)
AST: 14 IU/L (ref 0–40)
Albumin: 4.1 g/dL (ref 3.9–4.9)
Alkaline Phosphatase: 97 IU/L (ref 41–116)
Bilirubin Total: 0.3 mg/dL (ref 0.0–1.2)
Bilirubin, Direct: 0.1 mg/dL (ref 0.00–0.40)
Total Protein: 6.9 g/dL (ref 6.0–8.5)

## 2024-04-22 MED ORDER — TERBINAFINE HCL 250 MG PO TABS: 250.0000 mg | ORAL_TABLET | Freq: Every day | ORAL | 0 refills | Status: AC

## 2024-04-22 NOTE — Addendum Note (Signed)
 Addended by: Shamere Campas on: 04/22/2024 08:02 AM   Modules accepted: Orders

## 2024-05-06 ENCOUNTER — Ambulatory Visit: Admitting: Family Medicine

## 2024-07-22 ENCOUNTER — Ambulatory Visit: Admitting: Family Medicine

## 2024-10-14 ENCOUNTER — Ambulatory Visit: Admitting: Family Medicine
